# Patient Record
Sex: Female | Born: 1955 | Race: White | Hispanic: No | Marital: Married | Smoking: Former smoker
Health system: Southern US, Community
[De-identification: ages and names within clinical notes are randomized; demographics above are authoritative.]

## PROBLEM LIST (undated history)

## (undated) DIAGNOSIS — J984 Other disorders of lung: Secondary | ICD-10-CM

## (undated) DIAGNOSIS — J45909 Unspecified asthma, uncomplicated: Secondary | ICD-10-CM

## (undated) DIAGNOSIS — I10 Essential (primary) hypertension: Principal | ICD-10-CM

## (undated) HISTORY — DX: Essential (primary) hypertension: I10

## (undated) HISTORY — PX: TONSILLECTOMY: SHX5217

## (undated) HISTORY — DX: Other disorders of lung: J98.4

## (undated) HISTORY — DX: Unspecified asthma, uncomplicated: J45.909

## (undated) HISTORY — PX: ABDOMINAL HYSTERECTOMY: SHX81

## (undated) HISTORY — PX: BUNIONECTOMY: SHX129

---

## 1999-06-18 ENCOUNTER — Encounter: Admission: RE | Admit: 1999-06-18 | Discharge: 1999-06-18 | Payer: Self-pay | Admitting: Obstetrics and Gynecology

## 1999-06-18 ENCOUNTER — Encounter: Payer: Self-pay | Admitting: Obstetrics and Gynecology

## 1999-07-02 ENCOUNTER — Encounter: Admission: RE | Admit: 1999-07-02 | Discharge: 1999-07-02 | Payer: Self-pay | Admitting: Obstetrics and Gynecology

## 1999-07-02 ENCOUNTER — Encounter: Payer: Self-pay | Admitting: Obstetrics and Gynecology

## 1999-09-08 ENCOUNTER — Encounter (INDEPENDENT_AMBULATORY_CARE_PROVIDER_SITE_OTHER): Payer: Self-pay

## 1999-09-08 ENCOUNTER — Other Ambulatory Visit: Admission: RE | Admit: 1999-09-08 | Discharge: 1999-09-08 | Payer: Self-pay | Admitting: Obstetrics and Gynecology

## 2000-10-11 ENCOUNTER — Encounter: Payer: Self-pay | Admitting: Obstetrics and Gynecology

## 2000-10-11 ENCOUNTER — Encounter: Admission: RE | Admit: 2000-10-11 | Discharge: 2000-10-11 | Payer: Self-pay | Admitting: Obstetrics and Gynecology

## 2001-04-10 ENCOUNTER — Encounter: Payer: Self-pay | Admitting: Obstetrics and Gynecology

## 2001-04-18 ENCOUNTER — Inpatient Hospital Stay (HOSPITAL_COMMUNITY): Admission: RE | Admit: 2001-04-18 | Discharge: 2001-04-21 | Payer: Self-pay | Admitting: Obstetrics and Gynecology

## 2001-04-18 ENCOUNTER — Encounter (INDEPENDENT_AMBULATORY_CARE_PROVIDER_SITE_OTHER): Payer: Self-pay | Admitting: Specialist

## 2002-03-01 ENCOUNTER — Encounter: Admission: RE | Admit: 2002-03-01 | Discharge: 2002-03-01 | Payer: Self-pay | Admitting: Obstetrics and Gynecology

## 2002-03-01 ENCOUNTER — Encounter: Payer: Self-pay | Admitting: Obstetrics and Gynecology

## 2002-03-06 ENCOUNTER — Encounter: Payer: Self-pay | Admitting: Obstetrics and Gynecology

## 2002-03-06 ENCOUNTER — Encounter: Admission: RE | Admit: 2002-03-06 | Discharge: 2002-03-06 | Payer: Self-pay | Admitting: Obstetrics and Gynecology

## 2004-09-21 ENCOUNTER — Ambulatory Visit: Payer: Self-pay | Admitting: Internal Medicine

## 2004-12-10 ENCOUNTER — Ambulatory Visit: Payer: Self-pay | Admitting: Internal Medicine

## 2005-01-25 ENCOUNTER — Encounter: Admission: RE | Admit: 2005-01-25 | Discharge: 2005-01-25 | Payer: Self-pay | Admitting: Family Medicine

## 2005-05-24 ENCOUNTER — Other Ambulatory Visit: Admission: RE | Admit: 2005-05-24 | Discharge: 2005-05-24 | Payer: Self-pay | Admitting: Family Medicine

## 2005-08-08 ENCOUNTER — Ambulatory Visit (HOSPITAL_COMMUNITY): Admission: RE | Admit: 2005-08-08 | Discharge: 2005-08-08 | Payer: Self-pay | Admitting: Gastroenterology

## 2005-08-08 ENCOUNTER — Encounter (INDEPENDENT_AMBULATORY_CARE_PROVIDER_SITE_OTHER): Payer: Self-pay | Admitting: *Deleted

## 2008-01-22 ENCOUNTER — Ambulatory Visit: Payer: Self-pay | Admitting: Surgery

## 2008-05-06 ENCOUNTER — Ambulatory Visit: Payer: Self-pay | Admitting: Surgery

## 2008-05-06 ENCOUNTER — Encounter: Admission: RE | Admit: 2008-05-06 | Discharge: 2008-05-06 | Payer: Self-pay | Admitting: Surgery

## 2008-11-18 ENCOUNTER — Encounter: Admission: RE | Admit: 2008-11-18 | Discharge: 2008-11-18 | Payer: Self-pay | Admitting: Surgery

## 2008-11-18 ENCOUNTER — Ambulatory Visit: Payer: Self-pay | Admitting: Surgery

## 2009-08-18 ENCOUNTER — Ambulatory Visit: Payer: Self-pay | Admitting: Surgery

## 2009-08-18 ENCOUNTER — Encounter: Payer: Self-pay | Admitting: Internal Medicine

## 2009-08-18 ENCOUNTER — Encounter: Admission: RE | Admit: 2009-08-18 | Discharge: 2009-08-18 | Payer: Self-pay | Admitting: Surgery

## 2009-09-01 ENCOUNTER — Ambulatory Visit: Payer: Self-pay | Admitting: Internal Medicine

## 2009-09-01 DIAGNOSIS — J441 Chronic obstructive pulmonary disease with (acute) exacerbation: Secondary | ICD-10-CM | POA: Insufficient documentation

## 2009-09-01 DIAGNOSIS — J45909 Unspecified asthma, uncomplicated: Secondary | ICD-10-CM

## 2009-09-01 DIAGNOSIS — J4489 Other specified chronic obstructive pulmonary disease: Secondary | ICD-10-CM | POA: Insufficient documentation

## 2009-09-01 DIAGNOSIS — J449 Chronic obstructive pulmonary disease, unspecified: Secondary | ICD-10-CM

## 2009-09-01 HISTORY — DX: Unspecified asthma, uncomplicated: J45.909

## 2009-09-08 DIAGNOSIS — R918 Other nonspecific abnormal finding of lung field: Secondary | ICD-10-CM

## 2009-09-08 DIAGNOSIS — I10 Essential (primary) hypertension: Secondary | ICD-10-CM | POA: Insufficient documentation

## 2009-09-08 DIAGNOSIS — J984 Other disorders of lung: Secondary | ICD-10-CM

## 2009-09-08 HISTORY — DX: Other disorders of lung: J98.4

## 2009-09-08 HISTORY — DX: Essential (primary) hypertension: I10

## 2009-11-02 ENCOUNTER — Telehealth (INDEPENDENT_AMBULATORY_CARE_PROVIDER_SITE_OTHER): Payer: Self-pay | Admitting: *Deleted

## 2009-11-12 ENCOUNTER — Ambulatory Visit: Payer: Self-pay | Admitting: Internal Medicine

## 2009-11-27 ENCOUNTER — Telehealth: Payer: Self-pay | Admitting: Internal Medicine

## 2010-07-20 NOTE — Assessment & Plan Note (Signed)
Summary: ROV AFTER PFT ///KP   Primary Provider/Referring Provider:  Dr Ace Gins  CC:  Follow up visit after PFT.  History of Present Illness: History of Present Illness: 09-23-09- Asthma Former smoker with chronic, adult onset asthma, last seen by me several years ago. Followed by Dr Laneta Simmers with 3 stable lung nodules. Asthma worse through the winter after a cold. Notes more exertional dyspnea with her yoga.  She restarted Advair 250/50 and Proair. Asmanex didn't work. Uses Proair several times daily.  Just finished most recent round of prednisone. Notes that she started Lisinopril. Noting dry cough. She is trying to get back on Benicar. Not much post nasal drip. Not aware of heart burn or reflux. No cardiac disease.  Nov 12, 2009- Asthma, HTN, Lung nodule/ Dr Laneta Simmers PFT reviewed. Fev1/FVC 0.69 Her PF best is 380 and on last BP med she was at 160. For 2 days has been off BP med. Discused effect of BP med changes- difficult getting comfortable control. Now on Benicar, trying different samples. Using rescue inhaler up t twice daily. Using Advair regularly. Dr Laneta Simmers follows for lung nodule.      Asthma History    Asthma Control Assessment:    Age range: 12+ years    Symptoms: 0-2 days/week    Nighttime Awakenings: 0-2/month    Interferes w/ normal activity: no limitations    SABA use (not for EIB): 0-2 days/week    FEV1: 2.27 liters (today)    FEV1 Pred: 2.41 liters (today)    PEF: 6.54 liters/minute (today)    Asthma Control Assessment: Very Poorly Controlled   Preventive Screening-Counseling & Management  Alcohol-Tobacco     Smoking Status: quit > 6 months  Current Medications (verified): 1)  Advair Diskus 100-50 Mcg/dose Aepb (Fluticasone-Salmeterol) .Marland Kitchen.. 1 Puff Two Times A Day and Rinse Mouth Wel 2)  Proair Hfa 108 (90 Base) Mcg/act Aers (Albuterol Sulfate) .... 2 Puffs Four Times A Day As Needed 3)  Benicar Hct 20-12.5 Mg Tabs (Olmesartan  Medoxomil-Hctz) .... Take 1 By Mouth Once Daily 4)  Wellbutrin Xl 150 Mg Xr24h-Tab (Bupropion Hcl) .... Take 1 By Mouth Once Daily 5)  Alprazolam 0.5 Mg Tabs (Alprazolam) .... Take 1 By Mouth Three Times A Day As Needed  Allergies (verified): 1)  ! Tetracycline 2)  ! 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  The patient denies shortness of breath at rest, productive cough, non-productive cough, coughing up blood, chest pain, irregular heartbeats, acid heartburn, indigestion, loss of appetite, weight change, abdominal pain, difficulty swallowing, sore throat, tooth/dental problems, headaches, nasal congestion/difficulty breathing through nose, and sneezing.    Vital Signs:  Patient profile:   55 year old female Height:      64 inches Weight:      161 pounds BMI:     27.74 O2 Sat:  98 % on Room air Pulse rate:   71 / minute BP sitting:   124 / 80  (left arm) Cuff size:   regular  Vitals Entered By: Reynaldo Minium CMA (Nov 12, 2009 11:18 AM)  O2 Flow:  Room air  Physical Exam  Additional Exam:  General: A/Ox3; pleasant and cooperative, NAD, SKIN: no rash, lesions NODES: no lymphadenopathy HEENT: [place]/AT, EOM- WNL, Conjuctivae- clear, PERRLA, TM-WNL, Nose- clear, Throat- clear and wnl, .Mallampati  II NECK:  Supple w/ fair ROM, JVD- none, normal carotid impulses w/o bruits Thyroid- normal to palpation CHEST: Clear to P&A, very distant with no wheeze HEART: RRR, no m/g/r heard ABDOMEN: Soft and nl;  ZOX:WRUE, nl pulses, no edema  NEURO: Grossly intact to observation      Pre-Spirometry FEV1    Value: 2.27 L     Pred: 2.41 L     Impression & Recommendations:  Problem # 1:  ASTHMA (ICD-493.90) Asthma with a mild fixed small airway component perhaps residual from remote smoking. She is generally doing well. We discussed control status, lisinopril cough and influence of weather and BP meds.  Problem # 2:  HYPERTENSION (ICD-401.9)  Asking her to try alternatives to lisinopril because of cough, has left her wandering through sample trials with Dr Larina Bras. We discussed my preference that she avoid ACEIs and B-blockers if possible. Her updated medication list for this problem includes:    Benicar Hct 20-12.5 Mg Tabs (Olmesartan medoxomil-hctz) .Marland Kitchen... Take 1 by mouth once daily  Medications Added to Medication List This Visit: 1)  Benicar Hct 20-12.5 Mg Tabs (Olmesartan medoxomil-hctz) .... Take 1 by mouth once daily  Other Orders: Est. Patient Level IV (45409)  Patient Instructions: 1)  Please schedule a follow-up appointment in 1 year. 2)  Continue present meds- call as needed. 3)  Samples Benicar/HCT 20/ 12.5 x 3 weeks

## 2010-07-20 NOTE — Assessment & Plan Note (Signed)
Summary: breathing problem/ mbw   Primary Provider/Referring Provider:  Dr Kelsey Duran  CC:  follow up visit at pt's request; hasnt been seen in a while.Marland Kitchen  History of Present Illness: 20-Sep-2009- Asthma Former smoker with chronic, adult onset asthma, last seen by me several years ago. Followed by Dr Kelsey Duran with 3 stable lung nodules. Asthma worse through the winter after a cold. Notes more exertional dyspnea with her yoga.  She restarted Advair 250/50 and Proair. Asmanex didn't work. Uses Proair several times daily.  Just finished most recent round of prednisone. Notes that she started Lisinopril. Noting dry cough. She is trying to get back on Benicar. Not much post nasal drip. Not aware of heart burn or reflux. No cardiac disease.   Preventive Screening-Counseling & Management  Alcohol-Tobacco     Smoking Status: quit  Current Medications (verified): 1)  Advair Diskus 100-50 Mcg/dose Aepb (Fluticasone-Salmeterol) .Marland Kitchen.. 1 Puff Two Times A Day and Rinse Mouth Wel 2)  Proair Hfa 108 (90 Base) Mcg/act Aers (Albuterol Sulfate) .... 2 Puffs Four Times A Day As Needed 3)  Lisinopril-Hydrochlorothiazide 10-12.5 Mg Tabs (Lisinopril-Hydrochlorothiazide) .... Take 1 By Mouth Once Daily 4)  Wellbutrin Xl 150 Mg Xr24h-Tab (Bupropion Hcl) .... Take 1 By Mouth Once Daily 5)  Alprazolam 0.5 Mg Tabs (Alprazolam) .... Take 1 By Mouth Three Times A Day As Needed  Allergies (verified): 1)  ! Tetracycline 2)  ! 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 56 Divorced, 1 child Smoking Status:  quit  Review of Systems      See HPI       The patient complains of dyspnea on exertion and prolonged cough.  The patient denies anorexia, fever, weight loss, weight gain, vision loss, decreased hearing, hoarseness, chest pain, syncope, peripheral edema, headaches, hemoptysis, abdominal pain, and severe indigestion/heartburn.    Vital Signs:  Patient profile:   55 year old female Height:      64 inches Weight:      167.13 pounds BMI:     28.79 O2 Sat:      100 % on Room air Pulse rate:   78 / minute BP sitting:   118 / 76  (left arm) Cuff size:   regular  Vitals Entered By: Kelsey Duran CMA (09-20-2009 2:12 PM)  O2 Flow:  Room air  Physical Exam  Additional Exam:  General: A/Ox3; pleasant and cooperative, NAD, SKIN: no rash, lesions NODES: no lymphadenopathy HEENT: [place]/AT, EOM- WNL, Conjuctivae- clear, PERRLA, TM-WNL, Nose- clear, Throat- clear and wnl, .Mallampati  II NECK: Supple w/ fair ROM, JVD- none, normal carotid impulses w/o bruits Thyroid- normal to palpation CHEST: Clear to P&A HEART: RRR, no m/g/r heard ABDOMEN: Soft and nl;  EAV:WUJW, nl pulses, no edema  NEURO: Grossly intact to observation      Impression & Recommendations:  Problem # 1:  ASTHMA (ICD-493.90) Chronic intermittent asthma, worse since bronchitis this Fall. We will get old chart for PFT and  get CT report as per Dr Kelsey Duran. I will have her use the Advair 250 twice daily, substitute for lisinopril for comparison, and add Spiriva trial.  Problem # 2:  LUNG NODULE (ICD-518.89) This has been followed by Dr Kelsey Duran. CT report from 08/18/09 indicates bibasilar nodules stable since 2009.  Medications Added to Medication List  This Visit: 1)  Advair Diskus 100-50 Mcg/dose Aepb (Fluticasone-salmeterol) .Marland Kitchen.. 1 puff two times a day and rinse mouth wel 2)  Proair Hfa 108 (90 Base) Mcg/act Aers (Albuterol sulfate) .... 2 puffs four times a day as needed 3)  Lisinopril-hydrochlorothiazide 10-12.5 Mg Tabs (Lisinopril-hydrochlorothiazide) .... Take 1 by mouth once daily 4)  Wellbutrin Xl 150 Mg Xr24h-tab (Bupropion hcl) .... Take 1 by mouth once daily 5)  Alprazolam 0.5 Mg Tabs (Alprazolam) .... Take 1 by mouth three times a day as needed  Other Orders: Est. Patient Level IV (56213)  Patient Instructions: 1)  Please schedule a follow-up appointment in 1 month. 2)  Schedule PFT 3)  Samples BenicarHCT to try instead of Lisinopril 4)  Use Advair 250/50 (verify your strength), 1 puff and rinse mouth twice daily 5)  Try sample Spiriva, 1 daily.

## 2010-07-20 NOTE — Miscellaneous (Signed)
Summary: Orders Update pft charges  Clinical Lists Changes  Orders: Added new Service order of Carbon Monoxide diffusing w/capacity (94720) - Signed Added new Service order of Lung Volumes (94240) - Signed Added new Service order of Spirometry (Pre & Post) (94060) - Signed 

## 2010-07-20 NOTE — Progress Notes (Signed)
Summary: PRESCRIPT/ SAMPLES  Phone Note Call from Patient   Caller: Patient Call For: Kelsey Duran Summary of Call: PT NEED PRESCRIPT SENT TO [facility] FOR 90 DAY SUPPLY FOR ADVAIR250/50 AND WOULD LIKE SAMPLES IF WE HAVE. Initial call taken by: Rickard [name],  November 27, 2009 10:23 AM  Follow-up for Phone Call        Spoke with pt to verify strength of Advair because at OV on 08-2009 CY wrote to cont advair 250-5- but to verify dose because advair 100-50 is on opt med list. Pt states she could not remember at the OV what strength but it is advair 250-50 that she has been taking. Refill sent and a sample left at front to last pt until shipment comes. pt aware. med list corrected.  Carron Curie CMA  November 27, 2009 10:40 AM     New/Updated Medications: ADVAIR DISKUS 250-50 MCG/DOSE AEPB (FLUTICASONE-SALMETEROL) one puff twice daily Prescriptions: ADVAIR DISKUS 250-50 MCG/DOSE AEPB (FLUTICASONE-SALMETEROL) one puff twice daily  #3 x 3   Entered by:   Carron Curie CMA   Authorized by:   Waymon Budge MD   Signed by:   Carron Curie CMA on 11/27/2009   Method used:   Electronically to        [facility] (mail-order)             ,          Ph: [phone]       Fax: [phone]   RxID:   [id number]   Appended Document: PRESCRIPT/ SAMPLES    Clinical Lists Changes  Medications: Rx of ADVAIR DISKUS 250-50 MCG/DOSE AEPB (FLUTICASONE-SALMETEROL) one puff twice daily;  #3 x 3;  Signed;  Entered by: Carron Curie CMA;  Authorized by: Waymon Budge MD;  Method used: Faxed to [facility] [place] , ,   , Ph: [phone], Fax: [phone]    Prescriptions: ADVAIR DISKUS 250-50 MCG/DOSE AEPB (FLUTICASONE-SALMETEROL) one puff twice daily  #3 x 3   Entered by:   Carron Curie CMA   Authorized by:   Waymon Budge MD   Signed by:   Carron Curie CMA on 11/27/2009   Method used:   Faxed to ...       [facility] (mail-order)             ,          Ph:  [phone]       Fax: [phone]   RxID:   [id number]  rx sent electronically and needed to be sent by fax. Carron Curie CMA  November 27, 2009 10:42 AM

## 2010-07-20 NOTE — Progress Notes (Signed)
Summary: samples  Phone Note Call from Patient Call back at Home Phone [phone] Call back at [phone]   Caller: Patient Call For: young Reason for Call: Talk to Nurse Summary of Call: Benicar HCT - Can she get a couple of more samples to last her until she sees hime again?  20mg /12.5 Initial call taken by: Eugene Gavia,  Nov 02, 2009 9:03 AM  Follow-up for Phone Call        Pt aware that samples were left at front desk for pick up. Abigail Miyamoto RN  Nov 02, 2009 9:14 AM

## 2010-07-29 ENCOUNTER — Telehealth: Payer: Self-pay | Admitting: Internal Medicine

## 2010-08-05 NOTE — Progress Notes (Signed)
Summary: refill  Phone Note Call from Patient Call back at Work Phone [phone]   Caller: Patient Call For: Juanisha Bautch Reason for Call: Refill Medication Summary of Call: Requests refills on proair hfa 108 (3 month supply).//[facility] Initial call taken by: Darletta Moll,  July 29, 2010 3:21 PM  Follow-up for Phone Call        Rx sent. Pt aware.Carron Curie CMA  July 29, 2010 4:04 PM     Prescriptions: PROAIR HFA 108 (90 BASE) MCG/ACT AERS (ALBUTEROL SULFATE) 2 puffs four times a day as needed  #3 x 3   Entered by:   Carron Curie CMA   Authorized by:   Waymon Budge MD   Signed by:   Carron Curie CMA on 07/29/2010   Method used:   Faxed to ...       [facility] (mail-order)             , [place]         Ph: [phone]       Fax: [phone]   RxID:   [id number]

## 2010-08-11 ENCOUNTER — Other Ambulatory Visit: Payer: Self-pay | Admitting: Surgery

## 2010-08-11 DIAGNOSIS — R918 Other nonspecific abnormal finding of lung field: Secondary | ICD-10-CM

## 2010-09-07 ENCOUNTER — Ambulatory Visit
Admission: RE | Admit: 2010-09-07 | Discharge: 2010-09-07 | Disposition: A | Discharge status: Home / Self Care | Payer: 59 | Source: Ambulatory Visit | Attending: Surgery | Admitting: Surgery

## 2010-09-07 ENCOUNTER — Encounter (INDEPENDENT_AMBULATORY_CARE_PROVIDER_SITE_OTHER): Payer: 59 | Admitting: Surgery

## 2010-09-07 DIAGNOSIS — D381 Neoplasm of uncertain behavior of trachea, bronchus and lung: Secondary | ICD-10-CM

## 2010-09-07 DIAGNOSIS — R918 Other nonspecific abnormal finding of lung field: Secondary | ICD-10-CM

## 2010-09-07 NOTE — Assessment & Plan Note (Signed)
OFFICE VISIT  Kelsey, Duran DOB:  06/15/1956                                        September 07, 2010 CHART #:  [id number]  The patient returned to my office today for followup of bilateral noncalcified pulmonary nodules.  I last saw her on August 18, 2009, at which time, chest CT scan showed the bilateral pulmonary nodules to be stable with the largest nodule in the right lower lobe measuring about 7 mm.  Over the past year, she said she has felt well.  She denies any fever or chills.  She has had no cough or sputum production and denies hemoptysis.  She has had no chest discomfort.  Her weight has been stable.  On physical examination today, her blood pressure is 103/73, pulse is 104 and regular, respiratory rate 16 and unlabored.  Oxygen saturation on room air is 97%.  She looks well.  There is no cervical or supraclavicular adenopathy.  Her lungs are clear.  A chest CT scan today shows no change in the size of the small bilateral pulmonary nodules.  The largest one is still in the right lower lobe and measuring about 7 mm.  There are no new nodules present and no lymphadenopathy.  There is no pleural effusion.  IMPRESSION:  The patient has stable small bilateral pulmonary nodules that have not changed since November of 2009.  These are most likely benign.  I have recommended followup in 1 year with a chest x-ray.  All these nodules are much too small to see on chest x-ray, but given her stability since 2009, I do not think there is much benefit in continuing to do yearly CT scans.  There is also significant risk from the radiation dose of a pediatric CT scans.  Chest x-ray should allow [place] to pick up any significant changes in these pulmonary nodules. She is in agreement with that plan.  I will see her back in 1 year.  Kelsey Duran, M.D. Electronically Signed  BB/MEDQ  D:  09/07/2010  T:  09/07/2010  Job:  [id number]  cc:   Duncan Dull, M.D.

## 2010-10-14 ENCOUNTER — Ambulatory Visit: Payer: 59 | Admitting: Internal Medicine

## 2010-10-19 ENCOUNTER — Ambulatory Visit (INDEPENDENT_AMBULATORY_CARE_PROVIDER_SITE_OTHER): Payer: 59 | Admitting: Internal Medicine

## 2010-10-19 ENCOUNTER — Encounter: Payer: Self-pay | Admitting: Internal Medicine

## 2010-10-19 DIAGNOSIS — J45909 Unspecified asthma, uncomplicated: Secondary | ICD-10-CM

## 2010-10-19 DIAGNOSIS — Z0000 Encounter for general adult medical examination without abnormal findings: Secondary | ICD-10-CM

## 2010-10-19 DIAGNOSIS — I10 Essential (primary) hypertension: Secondary | ICD-10-CM

## 2010-10-19 MED ORDER — PREDNISONE 10 MG PO KIT: 1.0000 | PACK | ORAL | Status: DC

## 2010-10-19 MED ORDER — CLOBETASOL PROPIONATE 0.05 % EX CREA: TOPICAL_CREAM | Freq: Two times a day (BID) | CUTANEOUS | Status: DC

## 2010-10-19 MED ORDER — ALPRAZOLAM 0.5 MG PO TABS: 0.5000 mg | ORAL_TABLET | Freq: Two times a day (BID) | ORAL | Status: AC | PRN

## 2010-10-19 MED ORDER — ALBUTEROL SULFATE HFA 108 (90 BASE) MCG/ACT IN AERS: 2.0000 | INHALATION_SPRAY | Freq: Four times a day (QID) | RESPIRATORY_TRACT | Status: DC | PRN

## 2010-10-19 MED ORDER — FLUTICASONE-SALMETEROL 250-50 MCG/DOSE IN AEPB: 1.0000 | INHALATION_SPRAY | Freq: Two times a day (BID) | RESPIRATORY_TRACT | Status: DC

## 2010-10-19 MED ORDER — BUPROPION HCL ER (SR) 150 MG PO TB12: 150.0000 mg | ORAL_TABLET | Freq: Every day | ORAL | Status: DC

## 2010-10-19 NOTE — Progress Notes (Signed)
Subjective:    Patient ID: Kelsey Duran, female    DOB: 1956-04-19, 55 y.o.   MRN: [id number]  HPI 55 year old patient who is seen today to status with our practice. She has a long history of asthma and has been followed by pulmonary medicine. She has a ten-year history of hypertension. She has done relatively well except for a flare of hand eczema. She has tried a number of topical steroid creams including super high potency without much benefit. Her asthma has been fairly stable she does have a history of cough associated with lisinopril treatment in the past. She states that  she had 3 cortisone injections last year for treatment of what sounds like plantar fasciitis. No recent oral or injectable cortisone use  Colonoscopy at age 9   Past Medical History  Diagnosis Date  . ASTHMA 09/01/2009  . HYPERTENSION 09/08/2009  . LUNG NODULE 09/08/2009   Past Surgical History  Procedure Date  . Abdominal hysterectomy   . Tonsillectomy   . Bunionectomy     reports that she quit smoking about 27 years ago. She has never used smokeless tobacco. She reports that she drinks alcohol. She reports that she uses illicit drugs. family history is not on file. Allergies  Allergen Reactions  . Penicillins   . Tetracycline      Review of Systems  Constitutional: Negative for fever, appetite change, fatigue and unexpected weight change.  HENT: Negative for hearing loss, ear pain, nosebleeds, congestion, sore throat, mouth sores, trouble swallowing, neck stiffness, dental problem, voice change, sinus pressure and tinnitus.   Eyes: Negative for photophobia, pain, redness and visual disturbance.  Respiratory: Negative for cough, chest tightness and shortness of breath.   Cardiovascular: Negative for chest pain, palpitations and leg swelling.  Gastrointestinal: Negative for nausea, vomiting, abdominal pain, diarrhea, constipation, blood in stool, abdominal distention and rectal pain.  Genitourinary:  Negative for dysuria, urgency, frequency, hematuria, flank pain, vaginal bleeding, vaginal discharge, difficulty urinating, genital sores, vaginal pain, menstrual problem and pelvic pain.  Musculoskeletal: Negative for back pain and arthralgias.  Skin: Positive for rash.  Neurological: Negative for dizziness, syncope, speech difficulty, weakness, light-headedness, numbness and headaches.  Hematological: Negative for adenopathy. Does not bruise/bleed easily.  Psychiatric/Behavioral: Negative for suicidal ideas, behavioral problems, self-injury, dysphoric mood and agitation. The patient is not nervous/anxious.        Objective:   Physical Exam  Constitutional: She is oriented to person, place, and time. She appears well-developed and well-nourished.  HENT:  Head: Normocephalic and atraumatic.  Right Ear: External ear normal.  Left Ear: External ear normal.  Mouth/Throat: Oropharynx is clear and moist.  Eyes: Conjunctivae and EOM are normal. Pupils are equal, round, and reactive to light.  Neck: Normal range of motion. Neck supple. No JVD present. No thyromegaly present.  Cardiovascular: Normal rate, regular rhythm, normal heart sounds and intact distal pulses.   No murmur heard. Pulmonary/Chest: Effort normal and breath sounds normal. She has no wheezes. She has no rales.  Abdominal: Soft. Bowel sounds are normal. She exhibits no distension and no mass. There is no tenderness. There is no rebound and no guarding.  Musculoskeletal: Normal range of motion. She exhibits no edema and no tenderness.  Lymphadenopathy:    She has no cervical adenopathy.  Neurological: She is alert and oriented to person, place, and time. She has normal reflexes. No cranial nerve deficit. She exhibits normal muscle tone. Coordination normal.  Skin: Skin is warm and dry. Rash noted.  Erythematous dry flaky dermatitis involving both hands especially the palmar surface  Psychiatric: She has a normal mood and  affect. Her behavior is normal.          Assessment & Plan:  Asthma. Recently well-controlled Hypertension. Nice control Hand eczema. Will treat with a double strength 12 day prednisone Dosepak  Recheck in 6 months

## 2010-10-19 NOTE — Patient Instructions (Signed)
Limit your sodium (Salt) intake  Please check your blood pressure on a regular basis.  If it is consistently greater than 150/90, please make an office appointment.  Return in 6 months for follow-up   

## 2010-10-21 ENCOUNTER — Telehealth: Payer: Self-pay | Admitting: *Deleted

## 2010-10-21 MED ORDER — PREDNISONE (PAK) 10 MG PO TABS: 10.0000 mg | ORAL_TABLET | Freq: Every day | ORAL | Status: AC

## 2010-10-21 NOTE — Telephone Encounter (Signed)
Spoke with Dr. Amador Cunas , ordered incorrectly - new rx to [facility]. Attempt to call pt - VM - LMTCB if questions , [facility] rx [facility] KIK

## 2010-10-21 NOTE — Telephone Encounter (Addendum)
Pt was seen on Tuesday and under the impression she was getting Prednisone.  Has checked with [facility] on [address], and there are no meds there for her.  Please call and let her know the status.

## 2010-11-02 NOTE — Assessment & Plan Note (Signed)
OFFICE VISIT   Kelsey Duran, Kelsey Duran  DOB:  01-09-1956                                        November 18, 2008  CHART #:  [id number]   HISTORY OF PRESENT ILLNESS:  The patient returns today for followup of  noncalcified right lower lobe lung nodules seen on previous CT scan.  I  last saw her in May 06, 2008.  At which time, CT scan showed  several small noncalcified pulmonary nodules in the right lower lobe,  the largest measuring about 7 mm.  This was unchanged from her previous  CT scan.  The other nodules in the right lower lobe ranging size from 4-  6 mm and had not been noted on previous CT scan.  Since I last saw her,  she has had no complaints.   PHYSICAL EXAMINATION:  VITAL SIGNS:  Today, blood pressure is 122/76,  pulse 64 and regular, respiratory rate is 16, unlabored.  Oxygen  saturation 99% on room air.  GENERAL:  She looks well.  CARDIAC:  Regular rate and rhythm with normal heart sounds.  LUNGS:  Clear.  NECK:  There was no cervical or supraclavicular adenopathy.   DIAGNOSTIC DATA:  Followup CT scan of the chest today shows no  significant change in the small noncalcified pulmonary nodules in the  right lower lobe.  There are no pulmonary infiltrates or pleural  effusions.  There was no hilar or mediastinal adenopathy.   IMPRESSION:  The right lower lobe lung nodules are unchanged.  They are  small and noncalcified.  These may be benign scars but since they are  noncalcified they certainly could be small, developing cancers such as  bronchoalveolar cell carcinoma.  There also may be a precancerous  lesions.  They are too small to biopsied.  I recommended that we repeat  her CT scan in about 9 months to follow up on these lesions, and they  will require a longer term  followup to be sure that there is no significant change.  I will see her  back in 9 months with a repeat CT scan of the chest.   Evelene Croon, M.D.  Electronically Signed   BB/MEDQ  D:  11/18/2008  T:  11/19/2008  Job:  [id number]   cc:   Talmadge Coventry, M.D.

## 2010-11-02 NOTE — Assessment & Plan Note (Signed)
OFFICE VISIT   LEMMA, TETRO  DOB:  01-31-1956                                        May 06, 2008  CHART #:  [id number]   The patient returned today for followup of a right lower lobe lung  nodule seen on CT scan.  I saw her on January 22, 2008, after a CT scan on  January 11, 2008, showed a 67-mm nodule lesions in the superior segment of  the right lower lobe that is concerning for an early neoplasm.  It was  felt to be too small biopsy at that time and I thought that it may be an  inflammatory lesion.  I recommended doing a followup CT scan in 3  months.  Since I last saw her, she states she has been feeling well  overall with no specific complaints.   PHYSICAL EXAMINATION:  VITAL SIGNS:  Her blood pressure 115/76 and her  pulse is 80 and regular.  Respiratory rate is 18, unlabored.  Oxygen  saturation on room air is 98%.  GENERAL:  She looks well.  NECK:  There is no cervical, supraclavicular, or axillary adenopathy.  LUNGS:  Clear.   Followup chest CT scan dated May 06, 2008, shows several small  noncalcified pulmonary nodules in the right lower lobe.  The largest one  measures 7 mm and is located in the lateral and superior aspects of the  right lower lobe.  This is unchanged from her previous CT scan.  The  other nodules in the right lower lobe range in size from 4-6 mm and were  not noted on the previous CT scan of the chest.  There is no evidence of  pulmonary infiltrate or pleural effusion.  There is no evidence of hilar  or mediastinal masses or any lymphadenopathy.   IMPRESSION:  The previously noted right lower lobe lung nodule is  unchanged on CT scan.  There are a few other right lower lobe lung  nodules that are smaller, that were not noted on previous CT scan,  although this is a much better quality scan done today.  I reviewed her  old scan and there is one other nodule that was seen at about the same  level as the 7-mm  nodule, but more posteriorly.  It was not common at  all on a previous scan, but looks about the same today as on that  previous scan.  I think overall her CT scan is probably unchanged and  these nodules are  very small and indeterminate.  They are too small biopsy and I would  recommend continued follow up with a repeat CT scan in 6 months.  I will  see her back after that.   Evelene Croon, M.D.  Electronically Signed   BB/MEDQ  D:  05/06/2008  T:  05/07/2008  Job:  [id number]   cc:   Talmadge Coventry, M.D.

## 2010-11-02 NOTE — Assessment & Plan Note (Signed)
OFFICE VISIT   Kelsey Duran, Kelsey Duran  DOB:  07-Dec-1955                                        August 18, 2009  CHART #:  [id number]   HISTORY:  The patient returned to my office today for followup of  bilateral noncalcified lung nodules.  I last saw her on November 18, 2008, at  which time CT scan showed no significant change and small noncalcified  pulmonary nodules in both lungs with the largest being in the right  lower lobe.  Over the past year, she has had no change in her overall  condition.  She said she did develop an upper respiratory infection in  November, has had some cough ever since.  She denies any sputum  production.  She has had no hemoptysis.  She denies any fever or chills.   PHYSICAL EXAMINATION:  Today, blood pressure is 116/68, pulse is 82 and  regular, respiratory rate 16 and unlabored.  Oxygen saturation on room  air is 98%.  She looks well.  Neck exam shows no cervical or  supraclavicular adenopathy.  Lungs are clear.  Cardiac exam shows a  regular rate and rhythm with normal heart sounds.   DIAGNOSTIC TESTS:  Followup CT scan of the chest today shows no change  in the scattered bilateral pulmonary nodules compared to approximately 1  year ago.  The largest nodule was still in the right lower lobe and  measures about 7 mm.  There are no other abnormalities in the lung and  no pleural effusion.   IMPRESSION:  The patient has small bilateral pulmonary nodules they are  noncalcified that are unchanged since November 2009.  I think these are  most likely benign, but we will plan to see her back in 1 year with a  repeat CT scan of the chest to follow up on these.  I think that these  nodules are unchanged at that time, then we may not need to follow them  chronically.   Evelene Croon, M.D.  Electronically Signed   BB/MEDQ  D:  08/18/2009  T:  08/19/2009  Job:  [id number]
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
## 2010-11-04 ENCOUNTER — Encounter: Payer: Self-pay | Admitting: Internal Medicine

## 2010-11-05 NOTE — Op Note (Signed)
[facility]  Patient:    Kelsey, SOL Visit Number: [id number] MRN: [id number]          Service Type: GYN Location: 4W 0449 01 Attending Physician:  Lendon Colonel Dictated by:   Kathie Rhodes. Kyra Manges, M.D. Proc. Date: 04/18/01 Admit Date:  04/18/2001                             Operative Report  PREOPERATIVE DIAGNOSIS: Genital prolapse with cystocele, rectocele, and cervical prolapse.  POSTOPERATIVE DIAGNOSIS:  Genital prolapse with cystocele, rectocele, and cervical prolapse.  OPERATION PERFORMED:  Vaginal hysterectomy, A and P repair.  PROCEDURE: The patient was placed in the lithotomy position and prepped and draped in the usual fashion. The cervix was injected with a 1% Xylocaine with epinephrine solution.  The posterior cul-de-sac was incised with sharp dissection.  The uterosacral ligaments were identified, skeletonized, and clamped with a 0 chromic suture.  The cardinal ligaments were handled in a similar fashion. The anterior cul-de-sac was incised, and the vesicouterine peritoneum identified and entered.  The uterine arteries and upper broad ligament, including the round ligament were clamped and ligated with 0 chromic.  The specimen was then removed from the operative field.  The utero-ovarian anastomosis was then clamped and transected with 0 chromic. Both ovaries were normal.  A free tie was placed about the utero-ovarian pedicle as well.  The uterosacral ligaments were then carefully closed in the midline with interrupted sutures of 2-0 Ethibond and 0 Vicryl.  The peritoneum was then closed with 2-0 PDS.  Following this, the anterior repair was effected by dissecting the pubocervical vaginal fascia from the underlying vagina and plicating this in the midline with interrupted sutures of 3-0 Vicryl.  The vagina was then closed with interrupted sutures of 3-0 Vicryl and 4-0 Vicryl.  Posteriorly, the repair was effected by  dissecting the vagina from the underlying rectal fascia and plicating this in the midline with interrupted sutures of 3-0 and 4-0 Vicryl.  A wedge of the vagina was then removed and enclosed.  No pack was inserted.  Good repair appeared to be obtained.  The peritoneal skin was approximated with interrupted sutures of 3-0 and 4-0 Vicryl.  The bladder was then distended, and the suprapubic catheter was placed in.  This was hooked to drainage, as well as the Foley was.  There was no pack inserted.  About 10 cc of 1% Xylocaine with epinephrine was inserted into the posterior repair. Kelsey Duran tolerated this procedure well and was sent to the recovery room in good condition. Dictated by:   S. Kyra Manges, M.D. Attending Physician:  Lendon Colonel DD:  04/19/01 TD:  04/19/01 Job: [id number]

## 2010-11-05 NOTE — Op Note (Signed)
NAMEEARLIE, ARCIGA NO.:  [id number]   MEDICAL RECORD NO.:  [id number]          PATIENT TYPE:  AMB   LOCATION:  ENDO                         FACILITY:  [facility]   PHYSICIAN:  Anselmo Rod, M.D.  DATE OF BIRTH:  06/27/1955   DATE OF PROCEDURE:  08/08/2005  DATE OF DISCHARGE:                                 OPERATIVE REPORT   PROCEDURE PERFORMED:  Esophagogastroduodenoscopy with antral biopsies.   ENDOSCOPIST:  Charna Elizabeth, M.D.   INSTRUMENT USED:  Olympus video panendoscope.   INDICATIONS FOR PROCEDURE:  The patient is a 55 year old white female with a  history of melenic stools intermittently.  Rule out peptic ulcer disease,  esophagitis, gastritis, etc.   PREPROCEDURE PREPARATION:  Informed consent was procured from the patient.  The patient was fasted for four hours prior to the procedure.   PREPROCEDURE PHYSICAL:  The patient had stable vital signs.  Neck supple,  chest clear to auscultation.  S1, S2 regular.  Abdomen soft with normal  bowel sounds.   DESCRIPTION OF PROCEDURE:  The patient was placed in the left lateral  decubitus position and sedated with 50 mcg of fentanyl and 5 mg of Versed in  slow incremental doses.  Once the patient was adequately sedated and  maintained on low-flow oxygen and continuous cardiac monitoring, the Olympus  video panendoscope was advanced through the mouth piece over the tongue into  the esophagus under direct vision.  The proximal esophagus appeared normal.  There was mild distal esophagitis.  Antral gastritis was noted. Biopsies  were done to rule out presence of Helicobacter pylori by pathology.  No  ulcers, erosions, masses or polyps were identified.  The proximal small  bowel appeared normal.  There was no outlet obstruction.   IMPRESSION:  1.  Mild distal esophagitis.  2.  Antral gastritis, biopsies done for Helicobacter pylori.  3.  Normal-appearing proximal small bowel.   RECOMMENDATIONS:  1.  The  patient has been advised to try Nexium 40 mg by mouth daily or take      omeprazole 20 mg 2 pills every      morning 15 to 30 minutes before breakfast and avoid all nonsteroidals      for now.  2.  Outpatient followup in the next two weeks or earlier if need be.  3.  Proceed with a colonoscopy at this time.  Further recommendations will      be made in follow-up.      Anselmo Rod, M.D.  Electronically Signed     JNM/MEDQ  D:  08/09/2005  T:  08/10/2005  Job:  [id number]   cc:   Talmadge Coventry, M.D.  Fax: [phone]

## 2010-11-05 NOTE — H&P (Signed)
[facility]  Patient:    Kelsey Duran, Kelsey Duran Visit Number: [id number] MRN: [id number]          Service Type: GYN Location: 4W 0449 01 Attending Physician:  Lendon Colonel Dictated by:   Kathie Rhodes. Kyra Manges, M.D. Admit Date:  04/18/2001                           History and Physical  CHIEF COMPLAINT:  Pelvic pressure and feeling that things were falling out with mild stress incontinence.  HISTORY OF PRESENT ILLNESS:  The patient is a 55 year old gravida 1, para 1 female who delivered normally in 1993 who shows progressive pelvic pressure and feeling that things were falling out with a visible cervix at the introitus, a second and third cystocele, and moderate rectocele present. She desires permanent correction and is admitted for a ______ A&P repair. She has been fully informed of the risks and benefits, failure rate of the proposed surgery.  MEDICATIONS AND COMORBIDITIES: 1. Mild hypertension for which hydrochlorothiazide is given. 2. She is also on theophylline for asthma. She takes Serevent, Flovent, and    occasionally albuterol. Clinton D. Young, M.D. is her pulmonologist. In    September, she had to have a bolus of steroids for infection.  ALLERGIES:  She is allergic to PENICILLIN and has nausea with some CODEINE products, although she took Tylox in the past without difficulty.  PAST SURGICAL HISTORY:  She has a history of a tonsillectomy, oral surgery, and foot surgery.  REVIEW OF SYSTEMS:  HEENT:  She wears reading glasses but no decrease in visual or auditory acuity. No dizziness. No headaches. No frequent sore throat. HEART:  She has mild hypertension for which she takes a thiazide diuretic. It is well controlled and without symptoms of chest pain or exertional dyspnea. She has no history of mitral valve prolapse or rheumatic fever. LUNGS:  She has asthma and takes Serevent and Flovent on a regular basis. She noticed no shortness of  breath. No chronic cough or hemoptysis. GU: She has mild stress incontinence with no urge. No nocturia. No UTIs. GI:  No bowel habit change. No melena. No weight loss or gain. No anorexia. MUSCLES, BONES, AND JOINTS:  No fractures or arthritis.  SOCIAL HISTORY:  She worked for [employer]. She drinks socially. Does not smoke.  FAMILY HISTORY:  Her mother is 27, living and well. Her father died at age 52 of pancreatitis. She has one sister who is a borderline diabetic. She has a grandmother with heart disease and paternal uncle and aunt with diabetes.  PHYSICAL EXAMINATION:  GENERAL:  Well-developed, nourished female who appears to be younger than her stated age.  VITAL SIGNS:  Her blood pressure is 110/70, pulse 80, temperature 99.  HEENT:  Unremarkable. The oropharynx is not injected.  NECK:  Supple. Carotid pulses are equal bilaterally. The thyroid is not enlarged.  BREASTS:  No masses or tenderness. Axilla free from adenopathy.  LUNGS:  Clear to P&A.  HEART:  Normal sinus rhythm. No murmurs. No heaves, thrills, rubs, or gallops.  ABDOMEN:  Soft. Liver, spleen, and kidneys are not palpated. No bruits are heard. No tenderness.  EXTREMITIES:  Femoral pulses are equal. Good range of motion and equal pulses and reflexes.  PELVIC:  Second and third degree cystocele with the cervix at the introitus. Uterus is clinically normal size and shape. No adnexal masses are noted. There is moderate ______ rectal separation.  Hemoccult is negative. No pelvic masses are noted.  NEUROLOGICAL:  The patient is oriented to time, place, and recent events. Cranial nerves are grossly intact.  IMPRESSION:  Symptomatic pelvic relaxation with genital prolapse.  PLAN:  ______ A&P repair. Again, risks and benefits in detail and informed consent has been given to patient. Dictated by:   S. Kyra Manges, M.D. Attending Physician:  Lendon Colonel DD:  04/18/01 TD:  04/18/01 Job:  [id number]

## 2010-11-05 NOTE — Op Note (Signed)
Kelsey Duran, TROOP NO.:  [id number]   MEDICAL RECORD NO.:  [id number]          PATIENT TYPE:  AMB   LOCATION:  ENDO                         FACILITY:  [facility]   PHYSICIAN:  Anselmo Rod, M.D.  DATE OF BIRTH:  [birth year]   DATE OF PROCEDURE:  08/08/2005  DATE OF DISCHARGE:                                 OPERATIVE REPORT   PROCEDURE PERFORMED:  Screening colonoscopy.   ENDOSCOPIST:  Charna Elizabeth, M.D.   INSTRUMENT USED:  Olympus video colonoscope.   INDICATIONS FOR PROCEDURE:  A 55 year old white female undergoing screening  colonoscopy.  There is a questionable history of hematochezia.  The patient  has noted some black stools intermittently in the past.  Rule out colonic  polyps, masses, etc.   PREPROCEDURE PREPARATION:  Informed consent was procured from the patient.  The patient was fasted for four hours prior to the procedure after being  prepped with OsmoPrep pills the night of and the morning of the procedure.  The risks and benefits of the procedure including a 10% miss rate for polyps  or cancers was discussed with the patient as well.   PREPROCEDURE PHYSICAL:  The patient had stable vital signs.  Neck supple.  Chest clear to auscultation.  S1 and S2 regular.  Abdomen soft with normal  bowel sounds.   DESCRIPTION OF PROCEDURE:  The patient was placed in left lateral decubitus  position and sedated with an additional 100 mcg of fentanyl and 10 mg of  Versed in slow incremental doses.  Once the patient was adequately sedated  and maintained on low flow oxygen and continuous cardiac monitoring, the  Olympus video colonoscope was advanced from the rectum to the cecum.  A  small sessile polyp was removed by cold snare from the rectosigmoid colon.  The rest of the exam was unremarkable.  There was no evidence of  diverticulosis.  The appendicular orifice and ileocecal valve were clearly  visualized and photographed.  Midterminal ileum appeared healthy  and without  lesions.   IMPRESSION:  1.  Normal colonoscopy up to the terminal ileum except for a small sessile      polyp removed by cold snare from the rectosigmoid colon.  2.  Small external hemorrhoid seen on anal inspection.   RECOMMENDATIONS:  1.  Avoid all nonsteroidals for the next four weeks or longer.  2.  Repeat colonoscopy depending on pathology results once they are      procured.  3.  Outpatient followup as need arises in the future.      Anselmo Rod, M.D.  Electronically Signed     JNM/MEDQ  D:  08/09/2005  T:  08/10/2005  Job:  [id number]   cc:   Talmadge Coventry, M.D.  Fax: [phone]

## 2010-11-05 NOTE — Discharge Summary (Signed)
[facility]  Patient:    Kelsey Duran, Kelsey Duran Visit Number: [id number] MRN: [id number]          Service Type: GYN Location: 4W 0449 01 Attending Physician:  Lendon Colonel Dictated by:   Kathie Rhodes. Kyra Manges, M.D. Admit Date:  04/18/2001 Discharge Date: 04/21/2001   CC:         Clinton D. Maple Hudson, M.D.   Discharge Summary  ADMISSION DIAGNOSIS:  Genital prolapse with mild stress urinary incontinence.  DISCHARGE DIAGNOSIS:  Genital prolapse with mild stress urinary incontinence.  PROCEDURES: 1. Vaginal hysterectomy. 2. Anterior and posterior repair.  HISTORY OF PRESENT ILLNESS:  The patient was admitted to the hospital with pelvic pressure and the feelings that things were falling out with mild stress urinary incontinence.  On examination, she had third to fourth degree cystocele, and the cervix was at the introitus.  Pap smear was normal.  DIAGNOSTIC STUDIES:  Hemoglobin 14 and hematocrit 41.  SMA 23.  Routine chemistry was within normal limits.  Chest x-ray and electrocardiogram were normal.  COMORBIDITIES:  Asthma.  She was being followed by Clinton D. Young, M.D.  HOSPITAL COURSE:  The patient was admitted to the hospital and underwent an uneventful vaginal hysterectomy and A&P repair.  She was discharged on April 21, 2001 to home and office care.  She was given voiding trials at home.  She was given Percocet for pain and Ambien for sleep.  She is to use stool softeners.  CONDITION ON DISCHARGE:  Improved. Dictated by:   S. Kyra Manges, M.D. Attending Physician:  Lendon Colonel DD:  05/02/01 TD:  05/02/01 Job: [id number]

## 2010-11-11 ENCOUNTER — Ambulatory Visit: Payer: Self-pay | Admitting: Internal Medicine

## 2011-03-15 ENCOUNTER — Ambulatory Visit
Admission: RE | Admit: 2011-03-15 | Discharge: 2011-03-15 | Disposition: A | Discharge status: Home / Self Care | Payer: 59 | Source: Ambulatory Visit | Attending: Family Medicine | Admitting: Family Medicine

## 2011-03-15 ENCOUNTER — Other Ambulatory Visit: Payer: Self-pay | Admitting: Family Medicine

## 2011-03-15 DIAGNOSIS — R1032 Left lower quadrant pain: Secondary | ICD-10-CM

## 2011-03-15 MED ORDER — IOHEXOL 300 MG/ML  SOLN
100.0000 mL | Freq: Once | INTRAMUSCULAR | Status: AC | PRN
  Administered 2011-03-15: 100 mL via INTRAVENOUS

## 2011-03-30 ENCOUNTER — Encounter: Payer: Self-pay | Admitting: Internal Medicine

## 2011-03-30 ENCOUNTER — Ambulatory Visit (INDEPENDENT_AMBULATORY_CARE_PROVIDER_SITE_OTHER): Payer: 59 | Admitting: Internal Medicine

## 2011-03-30 ENCOUNTER — Telehealth: Payer: Self-pay | Admitting: Internal Medicine

## 2011-03-30 VITALS — BP 108/72 | HR 75 | Ht 64.0 in | Wt 164.8 lb

## 2011-03-30 DIAGNOSIS — J45909 Unspecified asthma, uncomplicated: Secondary | ICD-10-CM

## 2011-03-30 DIAGNOSIS — J984 Other disorders of lung: Secondary | ICD-10-CM

## 2011-03-30 MED ORDER — PREDNISONE (PAK) 10 MG PO TABS: ORAL_TABLET | ORAL | Status: DC

## 2011-03-30 NOTE — Progress Notes (Signed)
03/30/11-55 year old female former smoker followed for asthma complicated by hypertension and history of 3 lung nodules followed by Dr. Laneta Simmers. PCP Dr Shaune Pollack PFT 01/13/2003-FEV1 2.5 L/92%, FEV1/FVC 0.76 CT chest 08/11/2010 stable bilateral nodules without change since 2009. She reports doing well. Occasional wheeze is normal for her. She uses Advair once daily. This weekend noted increased cough, sneeze, wheeze. Peak flow dropped to 100. She started herself on prednisone 15 mg. 3 weeks ago she had been treated with antibiotics for diverticulitis(Dr. Loreta Ave).  ROS Constitutional:   No-   weight loss, night sweats, fevers, chills, fatigue, lassitude. HEENT:   No-  headaches, difficulty swallowing, tooth/dental problems, sore throat,     +sneezing, no- itching, ear ache, nasal congestion, post nasal drip,  CV:  No-   chest pain, orthopnea, PND, swelling in lower extremities, anasarca, dizziness, palpitations Resp: No-   shortness of breath with exertion or at rest.              +   productive cough,  No non-productive cough,  No-  coughing up of blood.              No-   change in color of mucus.  + wheezing.   Skin: No-   rash or lesions. GI:  No-   heartburn, indigestion, abdominal pain, nausea, vomiting, diarrhea,                 change in bowel habits, loss of appetite GU: No-   dysuria, change in color of urine, no urgency or frequency.  No- flank pain. MS:  No-   joint pain or swelling.  No- decreased range of motion.  No- back pain. Neuro- grossly normal to observation, Or:  Psych:  No- change in mood or affect. No depression or anxiety.  No memory loss.  OBJ General- Alert, Oriented, Affect-appropriate, Distress- none acute Skin- rash-none, lesions- none, excoriation- none Lymphadenopathy- none Head- atraumatic            Eyes- Gross vision intact, PERRLA, conjunctivae clear secretions            Ears- Hearing, canals            Nose- Clear, Septal dev, mucus, polyps, erosion,  perforation             Throat- Mallampati II , mucosa clear , drainage- none, tonsils- atrophic. Mild thrush (on nystatin). Husky voice. Neck- flexible , trachea midline, no stridor , thyroid nl, carotid no bruit Chest - symmetrical excursion , unlabored           Heart/CV- RRR , no murmur , no gallop  , no rub, nl s1 s2                           - JVD- none , edema- none, stasis changes- none, varices- none           Lung- clear to P&A, wheeze- none, cough- none , dullness-none, rub- none           Chest wall-  Abd- tender-no, distended-no, bowel sounds-present, HSM- no Br/ Gen/ Rectal- Not done, not indicated Extrem- cyanosis- none, clubbing, none, atrophy- none, strength- nl Neuro- grossly intact to observation

## 2011-03-30 NOTE — Telephone Encounter (Signed)
lmomtcb for pt to call back 

## 2011-03-30 NOTE — Telephone Encounter (Signed)
Patient returning call.

## 2011-03-30 NOTE — Patient Instructions (Addendum)
Try now increasing the Advair to twice daily. Be very deliberate about mough rinse- it may help to use Advair before meals.  Script for prednisone taper to use if necessary- sent

## 2011-03-30 NOTE — Telephone Encounter (Signed)
I spoke with pt and she states she has been having problems with her asthma x Saturday. Pt c/o increase SOB, dry cough, some chest congestion, nasal congestion, wheezing, little chest tightness. Pt states her peak flow has been around 100. Pt is scheduled to come in and see CDY today at 11:30

## 2011-04-01 NOTE — Assessment & Plan Note (Signed)
Recent acute symptoms associated with drop in peak flow indicate exacerbation with rhinitis and asthma. Plan-try increasing Advair to twice a day for now. Prednisone taper prescription to hold. Medications talk.

## 2011-04-01 NOTE — Assessment & Plan Note (Signed)
Nodules are probably benign after this length of followup.

## 2011-07-15 ENCOUNTER — Telehealth: Payer: Self-pay | Admitting: Internal Medicine

## 2011-07-15 MED ORDER — PREDNISONE (PAK) 10 MG PO TABS: ORAL_TABLET | ORAL | Status: DC

## 2011-07-15 NOTE — Telephone Encounter (Signed)
Pt returned call from triage. Kelsey Duran °

## 2011-07-15 NOTE — Telephone Encounter (Signed)
Per CY-okay to give Prednisone 10mg #20 take 4 x 2 days, 3 x 2 days, 2 x 2 days, 1 x 2 days, then stop no refills.  

## 2011-07-15 NOTE — Telephone Encounter (Signed)
Pt made aware of Prednisone taper and this has been sent to her pharmacy.

## 2011-07-15 NOTE — Telephone Encounter (Signed)
lmomtcb x1 

## 2011-07-15 NOTE — Telephone Encounter (Signed)
I spoke with pt w/ and she c/o cough (hard to cough anything up), chest congestion, increased SOB, chest tightness, wheezing. Denies any fever, chills, sweats, nausea, vomiting. Pt used her peak flow meter and was at 150. Pt is requesting to have prednisone called in for her into [facility]. Please advise Dr. Maple Hudson, thanks  Allergies  Allergen Reactions  . Penicillins   . Tetracycline

## 2011-08-30 ENCOUNTER — Other Ambulatory Visit: Payer: Self-pay | Admitting: Surgery

## 2011-08-30 DIAGNOSIS — D381 Neoplasm of uncertain behavior of trachea, bronchus and lung: Secondary | ICD-10-CM

## 2011-09-06 ENCOUNTER — Encounter: Payer: Self-pay | Admitting: Surgery

## 2011-09-06 ENCOUNTER — Ambulatory Visit (INDEPENDENT_AMBULATORY_CARE_PROVIDER_SITE_OTHER): Payer: 59 | Admitting: Surgery

## 2011-09-06 ENCOUNTER — Ambulatory Visit
Admission: RE | Admit: 2011-09-06 | Discharge: 2011-09-06 | Disposition: A | Discharge status: Home / Self Care | Payer: 59 | Source: Ambulatory Visit | Attending: Surgery | Admitting: Surgery

## 2011-09-06 VITALS — BP 105/73 | HR 76 | Resp 18 | Ht 63.0 in | Wt 158.0 lb

## 2011-09-06 DIAGNOSIS — J984 Other disorders of lung: Secondary | ICD-10-CM

## 2011-09-06 NOTE — Progress Notes (Signed)
                   [address]            [place] [address]          [phone]      HPI:  The patient returns to my  office today for followup of bilateral noncalcified pulmonary nodules. I last saw her on 09/07/2010 at which time the small bilateral pulmonary nodules have not changed since November of 2009. I felt these were most likely benign lesions and I recommended a followup chest x-ray in one year. She denies any significant problems over the past year. She did develop some upper respiratory symptoms a couple of months ago that have persisted but are gradually improving.  Current Outpatient Prescriptions  Medication Sig Dispense Refill  . albuterol (PROAIR HFA) 108 (90 BASE) MCG/ACT inhaler Inhale 2 puffs into the lungs every 6 (six) hours as needed.  1 Inhaler  6  . ALPRAZolam (XANAX) 0.5 MG tablet Take 1 tablet (0.5 mg total) by mouth 2 (two) times daily as needed.  60 tablet  3  . clobetasol (TEMOVATE) 0.05 % cream Apply topically 2 (two) times daily.  69 g  4  . Fluticasone-Salmeterol (ADVAIR DISKUS) 250-50 MCG/DOSE AEPB Inhale 1 puff into the lungs every 12 (twelve) hours.  60 each  4  . olmesartan-hydrochlorothiazide (BENICAR HCT) 20-12.5 MG per tablet Take 1 tablet by mouth daily.        . predniSONE (STERAPRED UNI-PAK) 10 MG tablet 4 X 2 DAYS, 3 X 2 DAYS, 2 X 2 DAYS, 1 X 2 DAYS  20 tablet  0     Physical Exam: BP 105/73  Pulse 76  Resp 18  Ht 5\' 3"  (1.6 m)  Wt 158 lb (71.668 kg)  BMI 27.99 kg/m2  SpO2 98% She looks well. Lung exam is clear. Cardiac exam shows a regular rate and rhythm with normal heart sounds.  Diagnostic Tests:  Chest x-ray today shows no acute abnormality. The previously noted pulmonary nodules seen on CT scan are not visible on chest x-ray.  Impression:  She continues to do well with a history of small bilateral pulmonary nodules that have been unchanged on CT scan over 3 year period. These were felt to be benign lesions.  I don't think there is a need for followup CT scans unless she developed a new abnormality on chest x-ray. Her chest x-ray is currently unremarkable. I have recommended that she have a followup chest x-ray in approximately 2 years and this can be done in her primary physician's office. I told her that she did not need to return to see me unless some new abnormality shows up on her chest x-ray.  Plan: She will continue to followup with her primary physician, Dr. Kevan Ny,  and should have a followup chest x-ray in about 2 years which can be scheduled by Dr. Kevan Ny. I'll be happy see her back if the need arises.

## 2011-11-28 ENCOUNTER — Telehealth: Payer: Self-pay | Admitting: Internal Medicine

## 2011-11-28 MED ORDER — ALBUTEROL SULFATE HFA 108 (90 BASE) MCG/ACT IN AERS: 2.0000 | INHALATION_SPRAY | Freq: Four times a day (QID) | RESPIRATORY_TRACT | Status: DC | PRN

## 2011-11-28 NOTE — Telephone Encounter (Signed)
rx has been sent and nothing further was needed

## 2012-02-24 ENCOUNTER — Telehealth: Payer: Self-pay | Admitting: Internal Medicine

## 2012-02-24 MED ORDER — PREDNISONE (PAK) 10 MG PO TABS: ORAL_TABLET | ORAL | Status: DC

## 2012-02-24 MED ORDER — CLARITHROMYCIN 500 MG PO TABS: 500.0000 mg | ORAL_TABLET | Freq: Two times a day (BID) | ORAL | Status: AC

## 2012-02-24 NOTE — Telephone Encounter (Signed)
Last OV 03/30/2011. Pending appt in Oct 2013.  Pt c/o increased sob, cough with dark yellow sputum, chest tightness and sneezing x 2 weeks. She says this has been ongoing for several months now. Pt denies any fever, sore throat or wheezing. Pls advise. Allergies  Allergen Reactions  . Penicillins   . Tetracycline

## 2012-02-24 NOTE — Telephone Encounter (Signed)
Per Dr. Maple Hudson  Biaxin 500mg  #14 1BID  Prednisone 10mg  #20--> 4x2 days, 3x2days, 2x2days, 1x2days then Stop

## 2012-02-24 NOTE — Telephone Encounter (Signed)
Called and spoke with pt and she is aware of rx that have been sent to the pharmacy per CY recs.  biaxin and pred dose pak have been sent.  Pt voiced her understanding and nothing further is needed.

## 2012-03-21 ENCOUNTER — Other Ambulatory Visit: Payer: 59

## 2012-03-21 ENCOUNTER — Encounter: Payer: Self-pay | Admitting: Internal Medicine

## 2012-03-21 ENCOUNTER — Ambulatory Visit (INDEPENDENT_AMBULATORY_CARE_PROVIDER_SITE_OTHER): Payer: 59 | Admitting: Internal Medicine

## 2012-03-21 VITALS — BP 110/60 | HR 95 | Ht 64.0 in | Wt 167.0 lb

## 2012-03-21 DIAGNOSIS — Z23 Encounter for immunization: Secondary | ICD-10-CM

## 2012-03-21 DIAGNOSIS — J45909 Unspecified asthma, uncomplicated: Secondary | ICD-10-CM

## 2012-03-21 DIAGNOSIS — J984 Other disorders of lung: Secondary | ICD-10-CM

## 2012-03-21 NOTE — Progress Notes (Signed)
03/30/11-56 year old female former smoker followed for asthma complicated by hypertension and history of 3 lung nodules followed by Dr. Laneta Simmers. PCP Dr Shaune Pollack PFT 01/13/2003-FEV1 2.5 L/92%, FEV1/FVC 0.76 CT chest 08/11/2010 stable bilateral nodules without change since 2009. She reports doing well. Occasional wheeze is normal for her. She uses Advair once daily. This weekend noted increased cough, sneeze, wheeze. Peak flow dropped to 100. She started herself on prednisone 15 mg. 3 weeks ago she had been treated with antibiotics for diverticulitis(Dr. Loreta Ave).  03/21/12- 56 year old female former smoker followed for asthma complicated by hypertension and history of 3 lung nodules followed by Dr. Laneta Simmers. PCP Dr Shaune Pollack Increased SOB since last visit; wheezing, was recently given abx and pred; still coughing-yellow in color but not as thick now; Would like to get Tdap shot today. Gets flu vaccine at school Peak flow running around 200. A good day is 300. Office spirometry 03/21/2012-FEV1 1.50/59%, FVC 2.67/84%, FEV1/FVC 0.56 FEF 25-75% 0.54. Moderate obstructive airways disease. CXR 09/06/11 IMPRESSION:  No active lung disease. Small lung nodules noted on CT of the  chest are not visible on chest x-ray. Thoracolumbar scoliosis.  Original Report Authenticated By: Juline Patch, M.D.   ROS- see HPI Constitutional:   No-   weight loss, night sweats, fevers, chills, fatigue, lassitude. HEENT:   No-  headaches, difficulty swallowing, tooth/dental problems, sore throat,     +sneezing, no- itching, ear ache, nasal congestion, post nasal drip,  CV:  No-   chest pain, orthopnea, PND, swelling in lower extremities, anasarca, dizziness, palpitations Resp: +  shortness of breath with exertion or at rest.              +   productive cough,  No non-productive cough,  No-  coughing up of blood.              No-   change in color of mucus.  + wheezing.   Skin: No-   rash or lesions. GI:  No-   heartburn,  indigestion, abdominal pain, nausea, vomiting,  GU:  MS:  No-   joint pain or swelling.   Neuro- nothing unusual Psych:  No- change in mood or affect. No depression or anxiety.  No memory loss.  OBJ General- Alert, Oriented, Affect-appropriate, Distress- none acute Skin- rash-none, lesions- none, excoriation- none Lymphadenopathy- none Head- atraumatic            Eyes- Gross vision intact, PERRLA, conjunctivae clear secretions            Ears- Hearing, canals            Nose- Clear, Septal dev, mucus, polyps, erosion, perforation             Throat- Mallampati II , mucosa clear , drainage- none, tonsils- atrophic. Mild thrush (on nystatin). Husky voice. Neck- flexible , trachea midline, no stridor , thyroid nl, carotid no bruit Chest - symmetrical excursion , unlabored           Heart/CV- RRR , no murmur , no gallop  , no rub, nl s1 s2                           - JVD- none , edema- none, stasis changes- none, varices- none           Lung- clear to P&A, wheeze- minimal, + loose cough , dullness-none, rub- none           Chest  wall-  Abd-  Br/ Gen/ Rectal- Not done, not indicated Extrem- cyanosis- none, clubbing, none, atrophy- none, strength- nl Neuro- grossly intact to observation

## 2012-03-21 NOTE — Patient Instructions (Addendum)
Office spirometry   Dx asthma with bronchitis  Order- Sputum culture- routine and AFB    Dx asthma with bronchitis  TDAP  Order- lab- Allergy profile  Sample Spiriva    1 daily  Sample Advair 500   1 puff then rinse mouth well, twice daily     Use this up then return to your usual Advair 250

## 2012-03-22 LAB — ALLERGY FULL PROFILE
Allergen,Goose feathers, e70: 0.14 kU/L — ABNORMAL HIGH
Alternaria Alternata: <0.1 kU/L
Bermuda Grass: 0.18 kU/L — ABNORMAL HIGH
Box Elder IgE: 0.24 kU/L — ABNORMAL HIGH
Common Ragweed: 0.71 kU/L — ABNORMAL HIGH
Curvularia lunata: <0.1 kU/L
D. farinae: 1 kU/L — ABNORMAL HIGH
Dog Dander: 37 kU/L — ABNORMAL HIGH
Fescue: <0.1 kU/L
G009 Red Top: <0.1 kU/L
House Dust Hollister: 16.3 kU/L — ABNORMAL HIGH
IgE (Immunoglobulin E), Serum: 255.3 IU/mL — ABNORMAL HIGH (ref 0.0–180.0)
Lamb's Quarters: 0.22 kU/L — ABNORMAL HIGH
Oak: 0.25 kU/L — ABNORMAL HIGH
Sycamore Tree: 0.23 kU/L — ABNORMAL HIGH

## 2012-03-23 ENCOUNTER — Other Ambulatory Visit: Payer: 59

## 2012-03-23 DIAGNOSIS — J45909 Unspecified asthma, uncomplicated: Secondary | ICD-10-CM

## 2012-03-23 NOTE — Progress Notes (Signed)
Quick Note:  LMTCB ______ 

## 2012-03-26 LAB — RESPIRATORY CULTURE OR RESPIRATORY AND SPUTUM CULTURE
Gram Stain: NONE SEEN
Gram Stain: NONE SEEN
Organism ID, Bacteria: NORMAL

## 2012-03-29 ENCOUNTER — Ambulatory Visit: Payer: 59 | Admitting: Internal Medicine

## 2012-03-30 NOTE — Assessment & Plan Note (Signed)
Being followed by Dr. Laneta Simmers [facility]

## 2012-03-30 NOTE — Assessment & Plan Note (Signed)
Sustained exacerbation through the summer. Plan-instead of Advair 250 try a sample Dulera 200, allergy profile, sputum culture She may have the TDAP she requested

## 2012-04-03 ENCOUNTER — Telehealth: Payer: Self-pay | Admitting: Internal Medicine

## 2012-04-03 MED ORDER — PREDNISONE 10 MG PO TABS: ORAL_TABLET | ORAL | Status: DC

## 2012-04-03 NOTE — Telephone Encounter (Signed)
Per CY---  1.  Try prednisone 10 mg    4 tabs x 3 days, 3 tabs x 3 days, 2 tabs x 3 days, 1 tab x 3 days.  #30  2.  Schedule pt for skin test---will have to use December 4.   lmomtcb for the pt to make her aware.

## 2012-04-03 NOTE — Telephone Encounter (Signed)
Spoke with pt and notified of recs per CDY She verbalized understanding Rx for pred taper was sent to pharm

## 2012-04-03 NOTE — Telephone Encounter (Signed)
   Result Note     Allergy profile - Broadly elevated allergy antibody levels. Allergy skin testing may be an option.   I spoke with patient about results and she verbalized understanding and had no questions. She stated she wants to do allergy testing. Reason being is she stated when she was having a hard time was after she had bathed her dog and before she even could get home she notices difficutly breathing. She also stated the spiriva and advair 500 is not helping either. She has not noticed any improvement. She is wanting to know if she could be put back on prednisone starting at a high dose and slowly taper down. Please advise Dr. Maple Hudson thanks

## 2012-04-03 NOTE — Telephone Encounter (Signed)
Returning call can be reached at [phone].Kelsey Duran ° °

## 2012-04-24 ENCOUNTER — Encounter: Payer: Self-pay | Admitting: Internal Medicine

## 2012-04-24 ENCOUNTER — Ambulatory Visit (INDEPENDENT_AMBULATORY_CARE_PROVIDER_SITE_OTHER): Payer: 59 | Admitting: Internal Medicine

## 2012-04-24 VITALS — BP 118/66 | HR 86 | Ht 64.0 in | Wt 164.6 lb

## 2012-04-24 DIAGNOSIS — K219 Gastro-esophageal reflux disease without esophagitis: Secondary | ICD-10-CM

## 2012-04-24 DIAGNOSIS — J45909 Unspecified asthma, uncomplicated: Secondary | ICD-10-CM

## 2012-04-24 DIAGNOSIS — R079 Chest pain, unspecified: Secondary | ICD-10-CM

## 2012-04-24 DIAGNOSIS — J45998 Other asthma: Secondary | ICD-10-CM

## 2012-04-24 DIAGNOSIS — R002 Palpitations: Secondary | ICD-10-CM

## 2012-04-24 MED ORDER — ALBUTEROL SULFATE HFA 108 (90 BASE) MCG/ACT IN AERS: 2.0000 | INHALATION_SPRAY | RESPIRATORY_TRACT | Status: DC | PRN

## 2012-04-24 MED ORDER — FLUTICASONE-SALMETEROL 250-50 MCG/DOSE IN AEPB: INHALATION_SPRAY | RESPIRATORY_TRACT | Status: DC

## 2012-04-24 NOTE — Progress Notes (Signed)
03/30/11-56 year old female former smoker followed for asthma complicated by hypertension and history of 3 lung nodules followed by Dr. Laneta Simmers. PCP Dr Shaune Pollack PFT 01/13/2003-FEV1 2.5 L/92%, FEV1/FVC 0.76 CT chest 08/11/2010 stable bilateral nodules without change since 2009. She reports doing well. Occasional wheeze is normal for her. She uses Advair once daily. This weekend noted increased cough, sneeze, wheeze. Peak flow dropped to 100. She started herself on prednisone 15 mg. 3 weeks ago she had been treated with antibiotics for diverticulitis(Dr. Loreta Ave).  03/21/12- 56 year old female former smoker followed for asthma complicated by hypertension and history of 3 lung nodules followed by Dr. Laneta Simmers. PCP Dr Shaune Pollack Increased SOB since last visit; wheezing, was recently given abx and pred; still coughing-yellow in color but not as thick now; Would like to get Tdap shot today. Gets flu vaccine at school Peak flow running around 200. A good day is 300. Office spirometry 03/21/2012-FEV1 1.50/59%, FVC 2.67/84%, FEV1/FVC 0.56 FEF 25-75% 0.54. Moderate obstructive airways disease. CXR 09/06/11 IMPRESSION:  No active lung disease. Small lung nodules noted on CT of the  chest are not visible on chest x-ray. Thoracolumbar scoliosis.  Original Report Authenticated By: Juline Patch, M.D.   04/24/12- 56 year old female former smoker followed for asthma complicated by hypertension and history of 3 lung nodules followed by Dr. Laneta Simmers. PCP Dr Shaune Pollack Denies any problems with her breathing. Cough has cleared up. Occasional palpitations, jaw pain, tingling right arm. Asks cardiology evaluation. Easy dyspnea on exertion lifting. Dizzy spells. Family history of heart disease. Took Xanax before coming today. Works 7 days per week, much stress, tired. Aware of heart burn. Continues Advair 500, Spiriva. Finishing prednisone taper. 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 Allergy profile 03/22/2012- total IgE 255.3 with significant elevations especially for dust mites, animal danders, grass and tree pollens EKG 04/24/12-sinus rhythm, NSSTTW changes.   ROS- see HPI Constitutional:   No-   weight loss, night sweats, fevers, chills, fatigue, lassitude. HEENT:   No-  headaches, difficulty swallowing, tooth/dental problems, sore throat,     +sneezing, no- itching, ear ache, nasal congestion, post nasal drip,  CV:  +chest pain, orthopnea, PND, swelling in lower extremities, anasarca, dizziness, +palpitations Resp: +  shortness of breath with exertion or at rest.              +   productive cough,  No non-productive cough,  No-  coughing up of blood.              No-   change in color of mucus.  + Occasional wheezing.   Skin: No-   rash or lesions. GI:  +   heartburn, indigestion, no-abdominal pain, no-nausea, vomiting,  GU:  MS:  No-   joint pain or swelling.   Neuro- nothing unusual Psych:  No- change in mood or affect. + depression or anxiety.  No memory loss.  OBJ General- Alert, Oriented, Affect-appropriate, Distress- none acute Skin- rash-none, lesions- none, excoriation- none Lymphadenopathy- none Head- atraumatic            Eyes- Gross vision intact, PERRLA, conjunctivae clear secretions            Ears- Hearing, canals            Nose- Clear, Septal dev, mucus, polyps, erosion, perforation             Throat- Mallampati II ,  mucosa clear , drainage- none, tonsils- atrophic.  Neck- flexible , trachea midline, no stridor , thyroid nl, carotid no bruit Chest - symmetrical excursion , unlabored           Heart/CV- RRR , no murmur , no gallop  , no rub, nl s1 s2                           - JVD- none , edema- none, stasis changes- none, varices- none           Lung- clear to P&A, wheeze- none,  cough-none , dullness-none, rub- none           Chest wall-  Abd-  Br/ Gen/ Rectal- Not done, not  indicated Extrem- cyanosis- none, clubbing, none, atrophy- none, strength- nl Neuro- grossly intact to observation

## 2012-04-24 NOTE — Patient Instructions (Addendum)
Order PFT   Dx asthma with bronchitis  Refill script for Advair and albuterol HFA for mail order  Order- referral to Cardiology for c/o chest pain/ palpitations and risk stratification- EKG done  Recommend you start an otc acid blocker like Pepcid/ famotidine, or Prilosec/ omeprazole, once every day before breakfast

## 2012-04-25 ENCOUNTER — Encounter: Payer: Self-pay | Admitting: Cardiovascular Disease

## 2012-04-25 ENCOUNTER — Ambulatory Visit (INDEPENDENT_AMBULATORY_CARE_PROVIDER_SITE_OTHER): Payer: 59 | Admitting: Cardiovascular Disease

## 2012-04-25 VITALS — BP 140/58 | HR 88 | Ht 63.0 in | Wt 164.4 lb

## 2012-04-25 DIAGNOSIS — R0600 Dyspnea, unspecified: Secondary | ICD-10-CM

## 2012-04-25 DIAGNOSIS — R079 Chest pain, unspecified: Secondary | ICD-10-CM

## 2012-04-25 DIAGNOSIS — I10 Essential (primary) hypertension: Secondary | ICD-10-CM

## 2012-04-25 DIAGNOSIS — R0609 Other forms of dyspnea: Secondary | ICD-10-CM | POA: Insufficient documentation

## 2012-04-25 NOTE — Progress Notes (Signed)
Patient ID: Kelsey Duran, female   DOB: September 25, 1955, 55 y.o.   MRN: [id number] 56 yo referred by Dr Maple Hudson for dyspnea and chest pain.  Multiple somatic complaints. Long standing asthma with recent flair and prednisone taper. Occasional jaw, left arm and chest pain.  Not necessarily exertional.  Sometimes related to dyspnea.  Breathing has not been good last couple of months.  No history of CAD  No previous stress test.  Family history of CAD.  No fever sputum History of lung nodules followed by Dr Laneta Simmers  No history of pulmonary hypertension.  Chest pain is intermitant not daily lasts less than a minute   ROS: Denies fever, malais, weight loss, blurry vision, decreased visual acuity, cough, sputum, SOB, hemoptysis, pleuritic pain, palpitaitons, heartburn, abdominal pain, melena, lower extremity edema, claudication, or rash.  All other systems reviewed and negative   General: Affect appropriate Healthy:  appears stated age HEENT: normal Neck supple with no adenopathy JVP normal no bruits no thyromegaly Lungs clear with no wheezing and good diaphragmatic motion Heart:  S1/S2 no murmur,rub, gallop or click PMI normal Abdomen: benighn, BS positve, no tenderness, no AAA no bruit.  No HSM or HJR Distal pulses intact with no bruits No edema Neuro non-focal Skin warm and dry No muscular weakness  Medications Current Outpatient Prescriptions  Medication Sig Dispense Refill  . albuterol (PROAIR HFA) 108 (90 BASE) MCG/ACT inhaler Inhale 2 puffs into the lungs every 4 (four) hours as needed for wheezing or shortness of breath.  3 Inhaler  3  . ALPRAZolam (XANAX) 0.5 MG tablet Take 1 tablet (0.5 mg total) by mouth 2 (two) times daily as needed.  60 tablet  3  . clobetasol (TEMOVATE) 0.05 % cream Apply topically 2 (two) times daily.  69 g  4  . Fluticasone-Salmeterol (ADVAIR DISKUS) 250-50 MCG/DOSE AEPB 1 puff then rinse mouth, twice daily  180 each  3  . olmesartan-hydrochlorothiazide (BENICAR HCT)  20-12.5 MG per tablet Take 1 tablet by mouth daily.        . predniSONE (DELTASONE) 10 MG tablet 4 x 3 days, 3 x 3 days, 2 x 3 days, 1 x 3 days then stop  30 tablet  0    Allergies Penicillins and Tetracycline  Family History: Family History  Problem Relation Age of Onset  . Heart attack Mother   . Stroke Sister   . Pancreatitis Father     Social History: History   Social History  . Marital Status: Married    Spouse Name: N/A    Number of Children: 1  . Years of Education: N/A   Occupational History  .  [employer]   Social History Main Topics  . Smoking status: Former Smoker -- 1.0 packs/day for 8 years    Types: Cigarettes    Quit date: 06/21/1983  . Smokeless tobacco: Never Used  . Alcohol Use: Yes  . Drug Use: Yes  . Sexually Active: Not on file   Other Topics Concern  . Not on file   Social History Narrative  . No narrative on file    Electrocardiogram:  SR rate 77 nonspecific St/T wave changes done 04/24/12  Assessment and Plan

## 2012-04-25 NOTE — Assessment & Plan Note (Signed)
Likely related to asthma  Check echo for RV and LV functin.  CXR reviewed 3/13 and NAD nodules not seen only by CT  F/U asthma with [facility] and lung nodules with [facility]

## 2012-04-25 NOTE — Patient Instructions (Signed)
Your physician recommends that you schedule a follow-up appointment in:  AS NEEDED  Your physician recommends that you continue on your current medications as directed. Please refer to the Current Medication list given to you today. Your physician has requested that you have an echocardiogram. Echocardiography is a painless test that uses sound waves to create images of your heart. It provides your doctor with information about the size and shape of your heart and how well your heart's chambers and valves are working. This procedure takes approximately one hour. There are no restrictions for this procedure. DX DYSPNEA  Your physician has requested that you have en exercise stress myoview. For further information please visit [email or web address] Please follow instruction sheet, as given. DX CHEST PAIN

## 2012-04-25 NOTE — Assessment & Plan Note (Signed)
Atypical nonspecific St/T wave changes on ECG  F/U exercise myovue

## 2012-04-25 NOTE — Assessment & Plan Note (Signed)
Well controlled.  Continue current medications and low sodium Dash type diet.    

## 2012-04-27 ENCOUNTER — Ambulatory Visit (HOSPITAL_COMMUNITY): Payer: 59 | Attending: Cardiology | Admitting: Radiology

## 2012-04-27 DIAGNOSIS — R0989 Other specified symptoms and signs involving the circulatory and respiratory systems: Secondary | ICD-10-CM | POA: Insufficient documentation

## 2012-04-27 DIAGNOSIS — R0600 Dyspnea, unspecified: Secondary | ICD-10-CM

## 2012-04-27 DIAGNOSIS — I369 Nonrheumatic tricuspid valve disorder, unspecified: Secondary | ICD-10-CM | POA: Insufficient documentation

## 2012-04-27 DIAGNOSIS — R0609 Other forms of dyspnea: Secondary | ICD-10-CM | POA: Insufficient documentation

## 2012-04-27 DIAGNOSIS — R0602 Shortness of breath: Secondary | ICD-10-CM

## 2012-04-27 DIAGNOSIS — I10 Essential (primary) hypertension: Secondary | ICD-10-CM | POA: Insufficient documentation

## 2012-04-27 NOTE — Progress Notes (Signed)
Echocardiogram performed.  

## 2012-04-30 ENCOUNTER — Ambulatory Visit (INDEPENDENT_AMBULATORY_CARE_PROVIDER_SITE_OTHER): Payer: 59 | Admitting: Internal Medicine

## 2012-04-30 DIAGNOSIS — J45909 Unspecified asthma, uncomplicated: Secondary | ICD-10-CM

## 2012-04-30 LAB — PULMONARY FUNCTION TEST

## 2012-04-30 NOTE — Progress Notes (Signed)
PFT done today. 

## 2012-05-03 ENCOUNTER — Ambulatory Visit (HOSPITAL_COMMUNITY): Payer: 59 | Attending: Cardiovascular Disease | Admitting: Radiology

## 2012-05-03 VITALS — BP 124/76 | HR 69 | Ht 63.0 in | Wt 161.0 lb

## 2012-05-03 DIAGNOSIS — R9431 Abnormal electrocardiogram [ECG] [EKG]: Secondary | ICD-10-CM

## 2012-05-03 DIAGNOSIS — I10 Essential (primary) hypertension: Secondary | ICD-10-CM | POA: Insufficient documentation

## 2012-05-03 DIAGNOSIS — R55 Syncope and collapse: Secondary | ICD-10-CM | POA: Insufficient documentation

## 2012-05-03 DIAGNOSIS — R0989 Other specified symptoms and signs involving the circulatory and respiratory systems: Secondary | ICD-10-CM | POA: Insufficient documentation

## 2012-05-03 DIAGNOSIS — R0602 Shortness of breath: Secondary | ICD-10-CM

## 2012-05-03 DIAGNOSIS — R079 Chest pain, unspecified: Secondary | ICD-10-CM

## 2012-05-03 DIAGNOSIS — Z8249 Family history of ischemic heart disease and other diseases of the circulatory system: Secondary | ICD-10-CM | POA: Insufficient documentation

## 2012-05-03 DIAGNOSIS — R0789 Other chest pain: Secondary | ICD-10-CM | POA: Insufficient documentation

## 2012-05-03 DIAGNOSIS — R0609 Other forms of dyspnea: Secondary | ICD-10-CM | POA: Insufficient documentation

## 2012-05-03 MED ORDER — TECHNETIUM TC 99M SESTAMIBI GENERIC - CARDIOLITE
10.0000 | Freq: Once | INTRAVENOUS | Status: AC | PRN
  Administered 2012-05-03: 10 via INTRAVENOUS

## 2012-05-03 MED ORDER — TECHNETIUM TC 99M SESTAMIBI GENERIC - CARDIOLITE
30.0000 | Freq: Once | INTRAVENOUS | Status: AC | PRN
  Administered 2012-05-03: 30 via INTRAVENOUS

## 2012-05-03 NOTE — Progress Notes (Signed)
[facility] SITE 3 NUCLEAR MED [address] [id number] [place] [address] [phone]  Cardiology Nuclear Med Study  Kelsey Duran is a 56 y.o. female     MRN : [id number]     DOB: 1955/08/01  Procedure Date: 05/03/2012  Nuclear Med Background Indication for Stress Test:  Evaluation for Ischemia and Abnormal EKG History:  04/27/12 ECHO: EF: 65-70% Cardiac Risk Factors: Family History - CAD, History of Smoking and Hypertension  Symptoms:  Chest Pain/Pressure/Tightness>(L) Arm/Jaw with Exertion (last episode of chest discomfort was yesterday), DOE, Fatigue, Light-Headedness, Near Syncope, Rapid HR and SOB with Chest Pain   Nuclear Pre-Procedure Caffeine/Decaff Intake:  None NPO After: 7:30pm   Lungs:  Expiratory wheezes.  Pro Air used prior to exercise at 9:10 am, wheezes cleared.  O2 Sat: 99% on room air. IV 0.9% NS with Angio Cath:  22g  IV Site: R Antecubital  IV Started by:  Milana Na, EMT-P  Chest Size (in):  36 Cup Size: C/D  Height: 5\' 3"  (1.6 m)  Weight:  161 lb (73.029 kg)  BMI:  Body mass index is 28.52 kg/(m^2). Tech CommentsInternational aid/development worker used at 7:30 am     Nuclear Med Study 1 or 2 day study: 1 day  Stress Test Type:  Stress  Reading MD: Kristeen Miss, MD  Order Authorizing Provider:  Charlton Haws MD  Resting Radionuclide: Technetium 17m Sestamibi  Resting Radionuclide Dose: 11.0 mCi   Stress Radionuclide:  Technetium 61m Sestamibi  Stress Radionuclide Dose:33.           Stress Protocol Rest HR: 69 Stress HR: 146  Rest BP: 124/76 Stress BP: 169/77  Exercise Time (min): 5:30 METS: 7.0   Predicted Max HR: 164 bpm % Max HR: 89.02 bpm Rate Pressure Product: 56433   Dose of Adenosine (mg):  n/a Dose of Lexiscan: n/a mg  Dose of Atropine (mg): n/a Dose of Dobutamine: n/a mcg/kg/min (at max HR)  Stress Test Technologist: Smiley Houseman, CMA-N  Nuclear Technologist:  Domenic Polite, CNMT     Rest Procedure:  Myocardial  perfusion imaging was performed at rest 45 minutes following the intravenous administration of Technetium 5m Sestamibi.  Rest ECG: Nonspecific ST-T wave changes.  Stress Procedure:  The patient exercised on the treadmill utilizing the Bruce protocol for 5:30 minutes. She then stopped due to fatigue and dyspnea with an O2 SAT of 98% at peak exercise.  She c/o chest pressure, 7-8/10 at peak exercise, but couldn't decide if it was from breathing or heart, it quickly went away with rest.  There were no diagnostic ST-T wave changes, there was some nonspecific changes.  Technetium 77m Sestamibi was injected at peak exercise and myocardial perfusion imaging was performed after a brief delay.  Stress ECG: No significant change from baseline ECG  QPS Raw Data Images:  Normal; no motion artifact; normal heart/lung ratio. Stress Images:  Normal homogeneous uptake in all areas of the myocardium. Rest Images:  Normal homogeneous uptake in all areas of the myocardium. Subtraction (SDS):  No evidence of ischemia. Transient Ischemic Dilatation (Normal <1.22):  0.93 Lung/Heart Ratio (Normal <0.45):  0.28  Quantitative Gated Spect Images QGS EDV:  53 ml QGS ESV:  10 ml  Impression Exercise Capacity:  Fair exercise capacity. BP Response:  Normal blood pressure response. Clinical Symptoms:  No significant symptoms noted. ECG Impression:  No significant ST segment change suggestive of ischemia. Comparison with Prior Nuclear Study: No previous nuclear study performed  Overall Impression:  Normal stress nuclear study.  No evidence of ischemia.  LV Ejection Fraction: 80%.  LV Wall Motion:  NL LV Function; NL Wall Motion  .

## 2012-05-06 DIAGNOSIS — K219 Gastro-esophageal reflux disease without esophagitis: Secondary | ICD-10-CM | POA: Insufficient documentation

## 2012-05-06 NOTE — Assessment & Plan Note (Addendum)
Good control Plan-update PFT. Switch from Advair 500 to Advair 250.

## 2012-05-06 NOTE — Assessment & Plan Note (Signed)
Plan-daily acid blocker before breakfast.

## 2012-05-06 NOTE — Assessment & Plan Note (Addendum)
She makes pretty clear that she is tired, overworked and a bit depressed. She recognizes that she has heartburn. She is asking for cardiology referral because of palpitations and family hx. EKG today looks normal.

## 2012-05-07 ENCOUNTER — Telehealth: Payer: Self-pay | Admitting: Cardiovascular Disease

## 2012-05-07 NOTE — Telephone Encounter (Signed)
PT AWARE OF MYOVIEW RESULTS./CY 

## 2012-05-07 NOTE — Telephone Encounter (Signed)
New Problem:    Called in returning your call from 05/04/12.  Please call back.

## 2012-05-11 NOTE — Progress Notes (Signed)
Quick Note:  LMTCB ______ 

## 2012-05-23 ENCOUNTER — Other Ambulatory Visit: Payer: 59

## 2012-05-23 ENCOUNTER — Ambulatory Visit (INDEPENDENT_AMBULATORY_CARE_PROVIDER_SITE_OTHER): Payer: 59 | Admitting: Internal Medicine

## 2012-05-23 ENCOUNTER — Encounter: Payer: Self-pay | Admitting: Internal Medicine

## 2012-05-23 VITALS — BP 90/72 | HR 87 | Ht 63.0 in | Wt 162.8 lb

## 2012-05-23 DIAGNOSIS — J45909 Unspecified asthma, uncomplicated: Secondary | ICD-10-CM

## 2012-05-23 DIAGNOSIS — Z91018 Allergy to other foods: Secondary | ICD-10-CM

## 2012-05-23 DIAGNOSIS — T781XXA Other adverse food reactions, not elsewhere classified, initial encounter: Secondary | ICD-10-CM

## 2012-05-23 DIAGNOSIS — J984 Other disorders of lung: Secondary | ICD-10-CM

## 2012-05-23 DIAGNOSIS — J3089 Other allergic rhinitis: Secondary | ICD-10-CM

## 2012-05-23 DIAGNOSIS — J302 Other seasonal allergic rhinitis: Secondary | ICD-10-CM | POA: Insufficient documentation

## 2012-05-23 DIAGNOSIS — J45998 Other asthma: Secondary | ICD-10-CM

## 2012-05-23 DIAGNOSIS — J309 Allergic rhinitis, unspecified: Secondary | ICD-10-CM

## 2012-05-23 MED ORDER — THEOPHYLLINE ER 100 MG PO TB12: ORAL_TABLET | ORAL | Status: DC

## 2012-05-23 MED ORDER — AZELASTINE-FLUTICASONE 137-50 MCG/ACT NA SUSP: 2.0000 | Freq: Every day | NASAL | Status: DC

## 2012-05-23 NOTE — Assessment & Plan Note (Addendum)
Strong positive skin test reactions. Allergy vaccine may become an option again. She might also be a Xolair candidate. There is a significant fixed obstructive component and I explained COPD to her. She has asked about trying theophylline again. I discussed this carefully and we decided to try a low dose.

## 2012-05-23 NOTE — Patient Instructions (Addendum)
Order- lab- alpha gal IgE    Dx food allergy    [phone]  Sample Dymista nasal spray     1-2 puffs each nostril once every day at bedtime. Watch for improvement in hoarseness and nasal symptoms  Script for theophylline bronchodilator tabs    Take twice daily with meals.   Watch for improved chest/ breathing   Watch out for insomnia, nervousness, heart burn

## 2012-05-23 NOTE — Assessment & Plan Note (Signed)
Plan-she understands to avoid foods which cause symptoms. We discussed need to intolerance suggested by the skin testing. Plan- lab for IgE to alpha-gal.

## 2012-05-23 NOTE — Assessment & Plan Note (Signed)
Plan-sample Dymista. nasal spray 

## 2012-05-23 NOTE — Progress Notes (Signed)
03/30/11-56 year old female former smoker followed for asthma complicated by hypertension and history of 3 lung nodules followed by Dr. Laneta Simmers. PCP Dr Shaune Pollack PFT 01/13/2003-FEV1 2.5 L/92%, FEV1/FVC 0.76 CT chest 08/11/2010 stable bilateral nodules without change since 2009. She reports doing well. Occasional wheeze is normal for her. She uses Advair once daily. This weekend noted increased cough, sneeze, wheeze. Peak flow dropped to 100. She started herself on prednisone 15 mg. 3 weeks ago she had been treated with antibiotics for diverticulitis(Dr. Loreta Ave).  03/21/12- 56 year old female former smoker followed for asthma complicated by hypertension and history of 3 lung nodules followed by Dr. Laneta Simmers. PCP Dr Shaune Pollack Increased SOB since last visit; wheezing, was recently given abx and pred; still coughing-yellow in color but not as thick now; Would like to get Tdap shot today. Gets flu vaccine at school Peak flow running around 200. A good day is 300. Office spirometry 03/21/2012-FEV1 1.50/59%, FVC 2.67/84%, FEV1/FVC 0.56 FEF 25-75% 0.54. Moderate obstructive airways disease. CXR 09/06/11 IMPRESSION:  No active lung disease. Small lung nodules noted on CT of the  chest are not visible on chest x-ray. Thoracolumbar scoliosis.  Original Report Authenticated By: Juline Patch, M.D.   04/24/12- 56 year old female former smoker followed for asthma complicated by hypertension and history of 3 lung nodules followed by Dr. Laneta Simmers. PCP Dr Shaune Pollack Denies any problems with her breathing. Cough has cleared up. Occasional palpitations, jaw pain, tingling right arm. Asks cardiology evaluation. Easy dyspnea on exertion lifting. Dizzy spells. Family history of heart disease. Took Xanax before coming today. Works 7 days per week, much stress, tired. Aware of heart burn. Continues Advair 500, Spiriva. Finishing prednisone taper. 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 Allergy profile 03/22/2012- total IgE 255.3 with significant elevations especially for dust mites, animal danders, grass and tree pollens EKG 04/24/12-sinus rhythm, NSSTTW changes.  05/23/12- 56 year old female former smoker followed for asthma complicated by hypertension and history of 3 lung nodules followed by Dr. Laneta Simmers. PCP Dr Shaune Pollack No antihistamines, OTC cough syrups, or sleep meds in past 2 days  Sputum 03/23/12- Neg AF[employer], Nl [place]ora Hoarseness and raspy cough come and go- not different this Fall. She stopped aspirin she was taking daily for joint pains, and thinks her breathing might have improved. Discussed inhalers. She asks about re-trying theophylline- discussed. Wants to lose weight.  No change with fall weather. Frequent cough and hoarseness, usually not productive. Allergy skin test 05/23/12- strong puncture reactions for ragweed, tree pollens, dust mite, cat, dog, coarse. Also to beef pork and lamb. We discussed these reactions and I made distinction between environmental respiratory allergies and foods. She understands to avoid foods which cause symptoms but has not been aware of problems with any meats. PFT: 04/30/2012-moderate obstructive airways disease with response to bronchodilator, air trapping, diffusion mildly reduced. FEV1 1.92/81%, FEV1/FVC 0.59, RV 134%, DLCO 74%  ROS- see HPI Constitutional:   No-   weight loss, night sweats, fevers, chills, fatigue, lassitude. HEENT:   No-  headaches, difficulty swallowing, tooth/dental problems, sore throat,     +sneezing, no- itching, ear ache, nasal congestion, post nasal drip,  CV:  +chest pain, orthopnea, PND, swelling in lower extremities, anasarca, dizziness, +palpitations Resp: +  shortness of breath with exertion or at rest.             No-  productive cough,  + non-productive cough,  No-  coughing up of blood.              No-   change in color of mucus.  + Occasional  wheezing.   Skin: No-   rash or lesions. GI:  +   heartburn, indigestion, no-abdominal pain, no-nausea, vomiting,  GU:  MS:  No-   joint pain or swelling.   Neuro- nothing unusual Psych:  No- change in mood or affect. + depression or anxiety.  No memory loss.  OBJ General- Alert, Oriented, Affect-appropriate, Distress- none acute Skin- rash-none, lesions- none, excoriation- none Lymphadenopathy- none Head- atraumatic            Eyes- Gross vision intact, PERRLA, conjunctivae clear secretions            Ears- Hearing, canals            Nose- Clear, Septal dev, mucus, polyps, erosion, perforation             Throat- Mallampati II , mucosa clear , drainage- none, tonsils- atrophic.  Neck- flexible , trachea midline, no stridor , thyroid nl, carotid no bruit Chest - symmetrical excursion , unlabored           Heart/CV- RRR , no murmur , no gallop  , no rub, nl s1 s2                           - JVD- none , edema- none, stasis changes- none, varices- none           Lung- clear to P&A, wheeze- none,  cough-none , dullness-none, rub- none           Chest wall-  Abd-  Br/ Gen/ Rectal- Not done, not indicated Extrem- cyanosis- none, clubbing, none, atrophy- none, strength- nl Neuro- grossly intact to observation

## 2012-05-30 NOTE — Progress Notes (Signed)
Quick Note:  Spoke with pt and notified of results per Dr. Young. Pt verbalized understanding and denied any questions.  ______ 

## 2012-06-11 ENCOUNTER — Encounter: Payer: Self-pay | Admitting: Internal Medicine

## 2012-06-25 ENCOUNTER — Ambulatory Visit: Payer: 59 | Admitting: Internal Medicine

## 2012-07-05 ENCOUNTER — Ambulatory Visit (INDEPENDENT_AMBULATORY_CARE_PROVIDER_SITE_OTHER): Payer: 59 | Admitting: Internal Medicine

## 2012-07-05 ENCOUNTER — Encounter: Payer: Self-pay | Admitting: Internal Medicine

## 2012-07-05 VITALS — BP 106/70 | HR 84 | Ht 63.0 in | Wt 163.6 lb

## 2012-07-05 DIAGNOSIS — J302 Other seasonal allergic rhinitis: Secondary | ICD-10-CM

## 2012-07-05 DIAGNOSIS — J309 Allergic rhinitis, unspecified: Secondary | ICD-10-CM

## 2012-07-05 DIAGNOSIS — J449 Chronic obstructive pulmonary disease, unspecified: Secondary | ICD-10-CM

## 2012-07-05 MED ORDER — FLUTICASONE PROPIONATE 50 MCG/ACT NA SUSP: 2.0000 | Freq: Every day | NASAL | Status: DC

## 2012-07-05 MED ORDER — AZELASTINE-FLUTICASONE 137-50 MCG/ACT NA SUSP: 2.0000 | Freq: Every day | NASAL | Status: DC

## 2012-07-05 MED ORDER — AZELASTINE HCL 0.1 % NA SOLN: NASAL | Status: DC

## 2012-07-05 MED ORDER — THEOPHYLLINE ER 100 MG PO TB12: ORAL_TABLET | ORAL | Status: DC

## 2012-07-05 NOTE — Progress Notes (Signed)
03/30/11-57 year old female former smoker followed for asthma complicated by hypertension and history of 3 lung nodules followed by Dr. Cyndia Bent. PCP Dr Darcus Austin PFT 01/13/2003-FEV1 2.5 L/92%, FEV1/FVC 0.76 CT chest 08/11/2010 stable bilateral nodules without change since 2009. She reports doing well. Occasional wheeze is normal for her. She uses Advair once daily. This weekend noted increased cough, sneeze, wheeze. Peak flow dropped to 100. She started herself on prednisone 15 mg. 3 weeks ago she had been treated with antibiotics for diverticulitis(Dr. Collene Mares).  03/21/12- 57 year old female former smoker followed for asthma complicated by hypertension and history of 3 lung nodules followed by Dr. Cyndia Bent. PCP Dr Darcus Austin Increased SOB since last visit; wheezing, was recently given abx and pred; still coughing-yellow in color but not as thick now; Would like to get Tdap shot today. Gets flu vaccine at school Peak flow running around 200. A good day is 300. Office spirometry 03/21/2012-FEV1 1.50/59%, FVC 2.67/84%, FEV1/FVC 0.56 FEF 25-75% 0.54. Moderate obstructive airways disease. CXR 09/06/11 IMPRESSION:  No active lung disease. Small lung nodules noted on CT of the  chest are not visible on chest x-ray. Thoracolumbar scoliosis.  Original Report Authenticated By: Joretta Bachelor, M.D.   04/24/12- 57 year old female former smoker followed for asthma complicated by hypertension and history of 3 lung nodules followed by Dr. Cyndia Bent. PCP Dr Darcus Austin Denies any problems with her breathing. Cough has cleared up. Occasional palpitations, jaw pain, tingling right arm. Asks cardiology evaluation. Easy dyspnea on exertion lifting. Dizzy spells. Family history of heart disease. Took Xanax before coming today. Works 7 days per week, much stress, tired. Aware of heart burn. Continues Advair 500, Spiriva. Finishing prednisone taper. 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 Allergy profile 03/22/2012- total IgE 255.3 with significant elevations especially for dust mites, animal danders, grass and tree pollens EKG 04/24/12-sinus rhythm, NSSTTW changes.  05/23/12- 57 year old female former smoker followed for asthma complicated by hypertension and history of 3 lung nodules followed by Dr. Cyndia Bent. PCP Dr Darcus Austin No antihistamines, OTC cough syrups, or sleep meds in past 2 days  Sputum 03/23/12- Neg [employer], Nl Flora Hoarseness and raspy cough come and go- not different this Fall. She stopped aspirin she was taking daily for joint pains, and thinks her breathing might have improved. Discussed inhalers. She asks about re-trying theophylline- discussed. Wants to lose weight.  No change with fall weather. Frequent cough and hoarseness, usually not productive. Allergy skin test 05/23/12- strong puncture reactions for ragweed, tree pollens, dust mite, cat, dog, coarse. Also to beef pork and lamb. We discussed these reactions and I made distinction between environmental respiratory allergies and foods. She understands to avoid foods which cause symptoms but has not been aware of problems with any meats. PFT: 04/30/2012-moderate obstructive airways disease with response to bronchodilator, air trapping, diffusion mildly reduced. FEV1 1.92/81%, FEV1/FVC 0.59, RV 134%, DLCO 74%  07/05/12- 57 year old female former smoker followed for asthma complicated by hypertension and history of 3 lung nodules followed by Dr. Cyndia Bent. PCP Dr Darcus Austin FOLLOWS FOR: doing well with Dymista and Theophylline; has a tickle clearing throat alot right now. There've been a lot of colds going around her office. She continues theophylline which we discussed again. Alpha-galI IgE was normal, making allergy to mammalian meat unlikely  She asks about trying allergy vaccine again based on the new skin tests. CXR 09/06/11 IMPRESSION:  No active  lung  disease. Small lung nodules noted on CT of the  chest are not visible on chest x-ray. Thoracolumbar scoliosis.  Original Report Authenticated By: Juline Patch, M.D.   ROS- see HPI Constitutional:   No-   weight loss, night sweats, fevers, chills, fatigue, lassitude. HEENT:   No-  headaches, difficulty swallowing, tooth/dental problems, sore throat,     +sneezing, no- itching, ear ache, nasal congestion, post nasal drip,  CV:  +chest pain, orthopnea, PND, swelling in lower extremities, anasarca, dizziness, +palpitations Resp: +  shortness of breath with exertion or at rest.             No-  productive cough,  + non-productive cough,  No-  coughing up of blood.              No-   change in color of mucus.  + Occasional wheezing.   Skin: No-   rash or lesions. GI:  +   heartburn, indigestion, no-abdominal pain, no-nausea, vomiting,  GU:  MS:  No-   joint pain or swelling.   Neuro- nothing unusual Psych:  No- change in mood or affect. + depression or anxiety.  No memory loss.  OBJ General- Alert, Oriented, Affect-appropriate, Distress- none acute Skin- rash-none, lesions- none, excoriation- none Lymphadenopathy- none Head- atraumatic            Eyes- Gross vision intact, PERRLA, conjunctivae clear secretions            Ears- Hearing, canals            Nose- Clear, Septal dev, mucus, polyps, erosion, perforation             Throat- Mallampati II , mucosa clear , drainage- none, tonsils- atrophic.  Neck- flexible , trachea midline, no stridor , thyroid nl, carotid no bruit Chest - symmetrical excursion , unlabored           Heart/CV- RRR , no murmur , no gallop  , no rub, nl s1 s2                           - JVD- none , edema- none, stasis changes- none, varices- none           Lung- clear to P&A, wheeze- none,  + deep breaths triggers dry cough , dullness-none, rub- none           Chest wall-  Abd-  Br/ Gen/ Rectal- Not done, not indicated Extrem- cyanosis- none, clubbing, none,  atrophy- none, strength- nl Neuro- grossly intact to observation

## 2012-07-05 NOTE — Patient Instructions (Addendum)
Add sample tudorza inhaler- 1 puff twice daily  Script for Dymista nasal spray  1-2 puffs each nostril, once daily at bedtime  In case Dymista is too expensive: Script for Flonase   1-2 puffs each nostril once daily at bedtime AND Script for Astelin     1-2 puffs each nostril once daily at bedtime  During bad seasons, these nasal sprays can all be used twice daily if needed  We can decide in the Spring whether we want to retry allergy shots

## 2012-07-18 NOTE — Assessment & Plan Note (Signed)
We know she is atopic and the trial of allergy vaccine would be very reasonable. She did not want to commit so we are scheduling return in a year with opportunity earlier as needed

## 2012-07-18 NOTE — Assessment & Plan Note (Signed)
Plan-add sample Tudorza inhaler. Watch impact the spring pollen season.

## 2012-08-16 ENCOUNTER — Telehealth: Payer: Self-pay | Admitting: Internal Medicine

## 2012-08-16 MED ORDER — FLUTICASONE-SALMETEROL 250-50 MCG/DOSE IN AEPB: INHALATION_SPRAY | RESPIRATORY_TRACT | Status: DC

## 2012-08-16 MED ORDER — ALBUTEROL SULFATE HFA 108 (90 BASE) MCG/ACT IN AERS: 2.0000 | INHALATION_SPRAY | RESPIRATORY_TRACT | Status: DC | PRN

## 2012-08-16 NOTE — Telephone Encounter (Signed)
Rx has been sent in, pt is aware. 

## 2012-08-28 ENCOUNTER — Telehealth: Payer: Self-pay | Admitting: Internal Medicine

## 2012-08-28 NOTE — Telephone Encounter (Signed)
Will forward back to triage to be handled tomorrow Coverage review # [phone] No ID available

## 2012-08-29 NOTE — Telephone Encounter (Signed)
Prior Berkley Harvey has been initiated through [email or web address]. I will forward message to Florentina Addison to look out for approval/denial. Carron Curie, CMA

## 2012-09-04 ENCOUNTER — Ambulatory Visit: Payer: 59 | Admitting: Internal Medicine

## 2012-09-04 ENCOUNTER — Encounter: Payer: Self-pay | Admitting: Internal Medicine

## 2012-09-04 ENCOUNTER — Other Ambulatory Visit: Payer: 59

## 2012-09-04 ENCOUNTER — Ambulatory Visit (INDEPENDENT_AMBULATORY_CARE_PROVIDER_SITE_OTHER): Payer: 59 | Admitting: Internal Medicine

## 2012-09-04 ENCOUNTER — Ambulatory Visit (INDEPENDENT_AMBULATORY_CARE_PROVIDER_SITE_OTHER)
Admission: RE | Admit: 2012-09-04 | Discharge: 2012-09-04 | Disposition: A | Discharge status: Home / Self Care | Payer: 59 | Source: Ambulatory Visit | Attending: Internal Medicine | Admitting: Internal Medicine

## 2012-09-04 VITALS — BP 110/56 | HR 88 | Ht 62.25 in | Wt 157.4 lb

## 2012-09-04 DIAGNOSIS — J42 Unspecified chronic bronchitis: Secondary | ICD-10-CM

## 2012-09-04 DIAGNOSIS — J309 Allergic rhinitis, unspecified: Secondary | ICD-10-CM

## 2012-09-04 DIAGNOSIS — J3089 Other allergic rhinitis: Secondary | ICD-10-CM

## 2012-09-04 DIAGNOSIS — J449 Chronic obstructive pulmonary disease, unspecified: Secondary | ICD-10-CM

## 2012-09-04 DIAGNOSIS — J302 Other seasonal allergic rhinitis: Secondary | ICD-10-CM

## 2012-09-04 MED ORDER — CLARITHROMYCIN 500 MG PO TABS: 500.0000 mg | ORAL_TABLET | Freq: Two times a day (BID) | ORAL | Status: DC

## 2012-09-04 MED ORDER — PREDNISONE (PAK) 10 MG PO TABS: ORAL_TABLET | ORAL | Status: DC

## 2012-09-04 MED ORDER — AZELASTINE HCL 0.1 % NA SOLN: NASAL | Status: DC

## 2012-09-04 MED ORDER — MOMETASONE FURO-FORMOTEROL FUM 100-5 MCG/ACT IN AERO: 2.0000 | INHALATION_SPRAY | Freq: Two times a day (BID) | RESPIRATORY_TRACT | Status: DC

## 2012-09-04 MED ORDER — FLUTICASONE PROPIONATE 50 MCG/ACT NA SUSP: 2.0000 | Freq: Every day | NASAL | Status: DC

## 2012-09-04 NOTE — Patient Instructions (Addendum)
Order- lab IgE   Dx chronic asthma with bronchitis  Sample and script Dulera 100   2 puffs then rinse mouth twice daily. This replaces Advair  Order CXR   Dx asthma with bronchits  Scripts to refill fluticasone and astelin  Script prednisone taper  Script Biaxin

## 2012-09-04 NOTE — Progress Notes (Signed)
03/30/11-57 year old female former smoker followed for asthma complicated by hypertension and history of 3 lung nodules followed by Dr. Cyndia Bent. PCP Dr Darcus Austin PFT 01/13/2003-FEV1 2.5 L/92%, FEV1/FVC 0.76 CT chest 08/11/2010 stable bilateral nodules without change since 2009. She reports doing well. Occasional wheeze is normal for her. She uses Advair once daily. This weekend noted increased cough, sneeze, wheeze. Peak flow dropped to 100. She started herself on prednisone 15 mg. 3 weeks ago she had been treated with antibiotics for diverticulitis(Dr. Collene Mares).  03/21/12- 57 year old female former smoker followed for asthma complicated by hypertension and history of 3 lung nodules followed by Dr. Cyndia Bent. PCP Dr Darcus Austin Increased SOB since last visit; wheezing, was recently given abx and pred; still coughing-yellow in color but not as thick now; Would like to get Tdap shot today. Gets flu vaccine at school Peak flow running around 200. A good day is 300. Office spirometry 03/21/2012-FEV1 1.50/59%, FVC 2.67/84%, FEV1/FVC 0.56 FEF 25-75% 0.54. Moderate obstructive airways disease. CXR 09/06/11 IMPRESSION:  No active lung disease. Small lung nodules noted on CT of the  chest are not visible on chest x-ray. Thoracolumbar scoliosis.  Original Report Authenticated By: Joretta Bachelor, M.D.   04/24/12- 57 year old female former smoker followed for asthma complicated by hypertension and history of 3 lung nodules followed by Dr. Cyndia Bent. PCP Dr Darcus Austin Denies any problems with her breathing. Cough has cleared up. Occasional palpitations, jaw pain, tingling right arm. Asks cardiology evaluation. Easy dyspnea on exertion lifting. Dizzy spells. Family history of heart disease. Took Xanax before coming today. Works 7 days per week, much stress, tired. Aware of heart burn. Continues Advair 500, Spiriva. Finishing prednisone taper. 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 Allergy profile 03/22/2012- total IgE 255.3 with significant elevations especially for dust mites, animal danders, grass and tree pollens EKG 04/24/12-sinus rhythm, NSSTTW changes.  05/23/12- 57 year old female former smoker followed for asthma complicated by hypertension and history of 3 lung nodules followed by Dr. Cyndia Bent. PCP Dr Darcus Austin No antihistamines, OTC cough syrups, or sleep meds in past 2 days  Sputum 03/23/12- Neg AF[employer], Nl [place] Hoarseness and raspy cough come and go- not different this Fall. She stopped aspirin she was taking daily for joint pains, and thinks her breathing might have improved. Discussed inhalers. She asks about re-trying theophylline- discussed. Wants to lose weight.  No change with fall weather. Frequent cough and hoarseness, usually not productive. Allergy skin test 05/23/12- strong puncture reactions for ragweed, tree pollens, dust mite, cat, dog, coarse. Also to beef pork and lamb. We discussed these reactions and I made distinction between environmental respiratory allergies and foods. She understands to avoid foods which cause symptoms but has not been aware of problems with any meats. PFT: 04/30/2012-moderate obstructive airways disease with response to bronchodilator, air trapping, diffusion mildly reduced. FEV1 1.92/81%, FEV1/FVC 0.59, RV 134%, DLCO 74%  07/05/12- 57 year old female former smoker followed for asthma complicated by hypertension and history of 3 lung nodules followed by Dr. Cyndia Bent. PCP Dr Darcus Austin FOLLOWS FOR: doing well with Dymista and Theophylline; has a tickle clearing throat alot right now. There've been a lot of colds going around her office. She continues theophylline which we discussed again. Alpha-galI IgE was normal, making allergy to mammalian meat unlikely  She asks about trying allergy vaccine again based on the new skin tests. CXR 09/06/11 IMPRESSION:  No active  lung  disease. Small lung nodules noted on CT of the  chest are not visible on chest x-ray. Thoracolumbar scoliosis.  Original Report Authenticated By: Juline Patch, M.D.   09/04/12- 57 year old female former smoker followed for asthma complicated by hypertension and history of 3 lung nodules followed by Dr. Laneta Simmers. PCP Dr Shaune Pollack FOLLOWS FOR: still having cough; insurance will not cover Advair -need new medication.  Coughing scant yellow, some shortness of breath, some frontal headache. Interested in Xolair. PFTs did show significant reactive airways and she is allergy skin test positive. We will check IgE level.  ROS- see HPI Constitutional:   No-   weight loss, night sweats, fevers, chills, fatigue, lassitude. HEENT:   + headaches, no-difficulty swallowing, tooth/dental problems, sore throat,     +sneezing, no- itching, ear ache, nasal congestion, post nasal drip,  CV:  +chest pain, orthopnea, PND, swelling in lower extremities, anasarca, dizziness, +palpitations Resp: +  shortness of breath with exertion or at rest.             +  productive cough,  + non-productive cough,  No-  coughing up of blood.              +  change in color of mucus.  + Occasional wheezing.   Skin: No-   rash or lesions. GI:  +   heartburn, indigestion, no-abdominal pain, no-nausea, vomiting,  GU:  MS:  No-   joint pain or swelling.   Neuro- nothing unusual Psych:  No- change in mood or affect. + depression or anxiety.  No memory loss.  OBJ General- Alert, Oriented, Affect-appropriate, Distress- none acute Skin- rash-none, lesions- none, excoriation- none Lymphadenopathy- none Head- atraumatic            Eyes- Gross vision intact, PERRLA, conjunctivae clear secretions            Ears- Hearing, canals            Nose- Clear, Septal dev, mucus, polyps, erosion, perforation             Throat- Mallampati II , mucosa clear , drainage+white postnasal drip, tonsils- atrophic.  Neck- flexible , trachea midline, no  stridor , thyroid nl, carotid no bruit Chest - symmetrical excursion , unlabored           Heart/CV- RRR , no murmur , no gallop  , no rub, nl s1 s2                           - JVD- none , edema- none, stasis changes- none, varices- none           Lung- ,  + deep breaths triggers dry, wheezy cough , dullness-none, rub- none           Chest wall-  Abd-  Br/ Gen/ Rectal- Not done, not indicated Extrem- cyanosis- none, clubbing, none, atrophy- none, strength- nl Neuro- grossly intact to observation

## 2012-09-05 LAB — IGE: IgE (Immunoglobulin E), Serum: 217.4 IU/mL — ABNORMAL HIGH (ref 0.0–180.0)

## 2012-09-05 NOTE — Progress Notes (Signed)
Quick Note:  LMTCB ______ 

## 2012-09-06 ENCOUNTER — Telehealth: Payer: Self-pay | Admitting: Internal Medicine

## 2012-09-06 NOTE — Telephone Encounter (Signed)
[name], have you received anything on pt. Please advise thanks

## 2012-09-06 NOTE — Progress Notes (Signed)
Quick Note:  Pt aware per 3.20.14 phone note and will discuss w/ CY at next appt. ______

## 2012-09-06 NOTE — Progress Notes (Signed)
Quick Note:  Pt aware per 3.20.14 phone note. ______

## 2012-09-06 NOTE — Telephone Encounter (Signed)
Called spoke with patient, advised of 3.18.14 cxr and lab results / recs as stated by CY.  Pt verbalized her understanding and denied any questions.  Will wait to discuss with CY at next ov about Xolair.  Nothing further needed; will sign off.

## 2012-09-06 NOTE — Telephone Encounter (Signed)
Returning call can be reached at [phone].Kelsey Duran

## 2012-09-06 NOTE — Telephone Encounter (Signed)
Notes Recorded by Waymon Budge, MD on 09/04/2012 at 8:13 PM CXR- obstructive airways pattern, consistent with PFTs, but no acute or active process.      LMTCB

## 2012-09-06 NOTE — Telephone Encounter (Signed)
PT returned call & will c/b in a little while. Antionette Fairy

## 2012-09-08 NOTE — Assessment & Plan Note (Signed)
Refill fluticasone and Astelin nasal sprays

## 2012-09-08 NOTE — Assessment & Plan Note (Addendum)
She may be a Xolair candidate, she is interested after discussion and I have given printed information. Plan- CXR, IgE change Advair to [facility] for insurance          For recent bronchitic exacerbation give prednisone taper and Biaxin

## 2012-09-10 NOTE — Telephone Encounter (Signed)
I spoke with pt. She is currently on dulera as of 09/04/12 OV. This replaces the advair. Nothing further was needed

## 2012-09-10 NOTE — Telephone Encounter (Signed)
Called to check on PA status and according to rep PA was never received through covermymeds so I did PA over the phone. Advair was denied because the pt has to try and fail 2 formulary medications: Symbicort and Dulera. Please advise if one of these meds are an appropriate alternative to Advair 250-50. Carron Curie, CMA Allergies  Allergen Reactions  . Penicillins   . Tetracycline

## 2012-09-10 NOTE — Telephone Encounter (Signed)
Try replacing Advair with Symbicort 160   #1, ref PRN, 2 puffs then rinse mouth, twice daily

## 2012-09-14 ENCOUNTER — Telehealth: Payer: Self-pay | Admitting: Internal Medicine

## 2012-09-14 MED ORDER — MOMETASONE FURO-FORMOTEROL FUM 100-5 MCG/ACT IN AERO: 2.0000 | INHALATION_SPRAY | Freq: Two times a day (BID) | RESPIRATORY_TRACT | Status: DC

## 2012-09-14 NOTE — Telephone Encounter (Signed)
Called, spoke with pt. Pt states dulera seems to be working really well for her. States she has the rx given at OV, but [facility] are "really slow" with mailing rxs out. Therefore, to save time, she would like [place] to send [facility] rx in rather than her mail in rx. This has been done. Pt aware and voiced no further questions or concerns at this time.

## 2012-10-04 ENCOUNTER — Telehealth: Payer: Self-pay | Admitting: Internal Medicine

## 2012-10-04 MED ORDER — FLUTICASONE PROPIONATE 50 MCG/ACT NA SUSP: 2.0000 | Freq: Every day | NASAL | Status: DC

## 2012-10-04 MED ORDER — AZELASTINE HCL 0.1 % NA SOLN: NASAL | Status: DC

## 2012-10-04 NOTE — Telephone Encounter (Signed)
Pt aware of correction.

## 2012-10-04 NOTE — Telephone Encounter (Signed)
Last ov w/ CY 3.18.14; refills sent at that ov Yes, the fluticasone and astelin were sent to [facility] for 30-day supply This has been corrected > sent for 90-day supply LMOM TCB x1 to apologize to pt for the inconvenience and inform her that the rx's have been resent

## 2012-10-10 ENCOUNTER — Ambulatory Visit (INDEPENDENT_AMBULATORY_CARE_PROVIDER_SITE_OTHER): Payer: 59 | Admitting: Internal Medicine

## 2012-10-10 ENCOUNTER — Encounter: Payer: Self-pay | Admitting: Internal Medicine

## 2012-10-10 VITALS — BP 124/76 | HR 91 | Ht 62.25 in | Wt 159.8 lb

## 2012-10-10 DIAGNOSIS — J45909 Unspecified asthma, uncomplicated: Secondary | ICD-10-CM

## 2012-10-10 DIAGNOSIS — J449 Chronic obstructive pulmonary disease, unspecified: Secondary | ICD-10-CM

## 2012-10-10 NOTE — Progress Notes (Signed)
03/30/11-57 year old female former smoker followed for asthma complicated by hypertension and history of 3 lung nodules followed by Dr. Cyndia Bent. PCP Dr Darcus Austin PFT 01/13/2003-FEV1 2.5 L/92%, FEV1/FVC 0.76 CT chest 08/11/2010 stable bilateral nodules without change since 2009. She reports doing well. Occasional wheeze is normal for her. She uses Advair once daily. This weekend noted increased cough, sneeze, wheeze. Peak flow dropped to 100. She started herself on prednisone 15 mg. 3 weeks ago she had been treated with antibiotics for diverticulitis(Dr. Collene Mares).  03/21/12- 57 year old female former smoker followed for asthma complicated by hypertension and history of 3 lung nodules followed by Dr. Cyndia Bent. PCP Dr Darcus Austin Increased SOB since last visit; wheezing, was recently given abx and pred; still coughing-yellow in color but not as thick now; Would like to get Tdap shot today. Gets flu vaccine at school Peak flow running around 200. A good day is 300. Office spirometry 03/21/2012-FEV1 1.50/59%, FVC 2.67/84%, FEV1/FVC 0.56 FEF 25-75% 0.54. Moderate obstructive airways disease. CXR 09/06/11 IMPRESSION:  No active lung disease. Small lung nodules noted on CT of the  chest are not visible on chest x-ray. Thoracolumbar scoliosis.  Original Report Authenticated By: Joretta Bachelor, M.D.   04/24/12- 57 year old female former smoker followed for asthma complicated by hypertension and history of 3 lung nodules followed by Dr. Cyndia Bent. PCP Dr Darcus Austin Denies any problems with her breathing. Cough has cleared up. Occasional palpitations, jaw pain, tingling right arm. Asks cardiology evaluation. Easy dyspnea on exertion lifting. Dizzy spells. Family history of heart disease. Took Xanax before coming today. Works 7 days per week, much stress, tired. Aware of heart burn. Continues Advair 500, Spiriva. Finishing prednisone taper. 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 Allergy profile 03/22/2012- total IgE 255.3 with significant elevations especially for dust mites, animal danders, grass and tree pollens EKG 04/24/12-sinus rhythm, NSSTTW changes.  05/23/12- 57 year old female former smoker followed for asthma complicated by hypertension and history of 3 lung nodules followed by Dr. Cyndia Bent. PCP Dr Darcus Austin No antihistamines, OTC cough syrups, or sleep meds in past 2 days  Sputum 03/23/12- Neg [employer], Nl [place] Hoarseness and raspy cough come and go- not different this Fall. She stopped aspirin she was taking daily for joint pains, and thinks her breathing might have improved. Discussed inhalers. She asks about re-trying theophylline- discussed. Wants to lose weight.  No change with fall weather. Frequent cough and hoarseness, usually not productive. Allergy skin test 05/23/12- strong puncture reactions for ragweed, tree pollens, dust mite, cat, dog, coarse. Also to beef pork and lamb. We discussed these reactions and I made distinction between environmental respiratory allergies and foods. She understands to avoid foods which cause symptoms but has not been aware of problems with any meats. PFT: 04/30/2012-moderate obstructive airways disease with response to bronchodilator, air trapping, diffusion mildly reduced. FEV1 1.92/81%, FEV1/FVC 0.59, RV 134%, DLCO 74%  07/05/12- 57 year old female former smoker followed for asthma complicated by hypertension and history of 3 lung nodules followed by Dr. Cyndia Bent. PCP Dr Darcus Austin FOLLOWS FOR: doing well with Dymista and Theophylline; has a tickle clearing throat alot right now. There've been a lot of colds going around her office. She continues theophylline which we discussed again. Alpha-galI IgE was normal, making allergy to mammalian meat unlikely  She asks about trying allergy vaccine again based on the new skin tests. CXR 09/06/11 IMPRESSION:  No active  lung  disease. Small lung nodules noted on CT of the  chest are not visible on chest x-ray. Thoracolumbar scoliosis.  Original Report Authenticated By: Juline Patch, M.D.   09/04/12- 57 year old female former smoker followed for asthma complicated by hypertension and history of 3 lung nodules followed by Dr. Laneta Simmers. PCP Dr Shaune Pollack FOLLOWS FOR: still having cough; insurance will not cover Advair -need new medication.  Coughing scant yellow, some shortness of breath, some frontal headache. Interested in Xolair. PFTs did show significant reactive airways and she is allergy skin test positive. We will check IgE level.  10/11/11- 57 year old female former smoker followed for asthma/ COPD complicated by hypertension and history of 3 lung nodules followed by Dr. Laneta Simmers. PCP Dr Shaune Pollack [facility] inhaler wasn't enough for control, also needed a prednisone taper. Finished prednisone yesterday and doing well today. We had a detailed discussion of Xolair including purpose, mechanism, risks, protocol. She is very interested. IgE 09/04/12- 217.4 CXR 09/05/12 IMPRESSION:  Hyperinflation configuration. No pulmonary edema, pneumonia, or  pleural effusion.  Original Report Authenticated By: Onalee Hua Call   ROS- see HPI Constitutional:   No-   weight loss, night sweats, fevers, chills, fatigue, lassitude. HEENT:   + headaches, no-difficulty swallowing, tooth/dental problems, sore throat,     +sneezing, no- itching, ear ache, nasal congestion, post nasal drip,  CV:  No-chest pain, orthopnea, PND, swelling in lower extremities, anasarca, dizziness, +palpitations Resp: +  shortness of breath with exertion or at rest.             +  productive cough,  + non-productive cough,  No-  coughing up of blood.              +  change in color of mucus.  + Occasional wheezing.   Skin: No-   rash or lesions. GI:  +   heartburn, indigestion, no-abdominal pain, no-nausea, vomiting,  GU:  MS:  No-   joint pain or swelling.    Neuro- nothing unusual Psych:  No- change in mood or affect. + depression or anxiety.  No memory loss.  OBJ General- Alert, Oriented, Affect-appropriate, Distress- none acute Skin- rash-none, lesions- none, excoriation- none Lymphadenopathy- none Head- atraumatic            Eyes- Gross vision intact, PERRLA, conjunctivae clear secretions            Ears- Hearing, canals            Nose- Clear, Septal dev, mucus, polyps, erosion, perforation             Throat- Mallampati II , mucosa clear , drainage +white postnasal drip, tonsils- atrophic.  Neck- flexible , trachea midline, no stridor , thyroid nl, carotid no bruit Chest - symmetrical excursion , unlabored           Heart/CV- RRR , no murmur , no gallop  , no rub, nl s1 s2                           - JVD- none , edema- none, stasis changes- none, varices- none           Lung-   clear , dullness-none, rub- none           Chest wall-  Abd-  Br/ Gen/ Rectal- Not done, not indicated Extrem- cyanosis- none, clubbing, none, atrophy- none, strength- nl Neuro- grossly intact to observation

## 2012-10-10 NOTE — Patient Instructions (Addendum)
Order-  [facility] Start Xolair application   Please call as needed

## 2012-10-17 NOTE — Assessment & Plan Note (Signed)
Better control at least temporarily after recent prednisone. She is interested in Xolair which we have discussed carefully.

## 2012-10-18 ENCOUNTER — Encounter: Payer: Self-pay | Admitting: Internal Medicine

## 2012-10-18 MED ORDER — OMALIZUMAB 150 MG ~~LOC~~ SOLR: 225.0000 mg | SUBCUTANEOUS | Status: DC

## 2012-10-18 NOTE — Addendum Note (Signed)
Addended by: Jetty Duhamel D on: 10/18/2012 08:40 AM   Modules accepted: Orders

## 2012-10-18 NOTE — Addendum Note (Signed)
Addended by: Jetty Duhamel D on: 10/18/2012 08:29 AM   Modules accepted: Orders

## 2012-10-18 NOTE — Addendum Note (Signed)
Addended by: Ronny Bacon on: 10/18/2012 03:54 PM   Modules accepted: Orders

## 2012-11-05 ENCOUNTER — Telehealth: Payer: Self-pay | Admitting: Internal Medicine

## 2012-11-05 MED ORDER — CEFDINIR 300 MG PO CAPS: 300.0000 mg | ORAL_CAPSULE | Freq: Two times a day (BID) | ORAL | Status: DC

## 2012-11-05 MED ORDER — PROMETHAZINE-CODEINE 6.25-10 MG/5ML PO SYRP: 5.0000 mL | ORAL_SOLUTION | Freq: Four times a day (QID) | ORAL | Status: DC | PRN

## 2012-11-05 NOTE — Telephone Encounter (Signed)
Per CY-Rx Cefdinir 300 mg #14 take 1 po BID no refills(cephalosporin-most people allergic to PCN can take this) and Rx Promethazine with codeine cough syrup #237ml 1 tsp every 6 hours prn cough no refills.

## 2012-11-05 NOTE — Telephone Encounter (Signed)
lmomtcb x1 for pt 

## 2012-11-05 NOTE — Telephone Encounter (Signed)
Last OV 10/10/12. Pt is c/o having chest congestion, nasal congestion, dry cough, sore throat x 1 week. Pt states she has coughed so much that her ribs and back are sore. Pt states she is using flonase and astelin as directed without relief. Xolair referral was placed and has been submitted to company and we are waiting on approval.  Please advise. Carron Curie, CMA Allergies  Allergen Reactions  . Biaxin (Clarithromycin)     GI UPSET  . Penicillins   . Tetracycline

## 2012-11-05 NOTE — Telephone Encounter (Signed)
Pt aware of recs and rx sent. [name], CMA  

## 2012-11-08 ENCOUNTER — Ambulatory Visit (INDEPENDENT_AMBULATORY_CARE_PROVIDER_SITE_OTHER): Payer: 59 | Admitting: Adult Health

## 2012-11-08 ENCOUNTER — Encounter: Payer: Self-pay | Admitting: Adult Health

## 2012-11-08 ENCOUNTER — Telehealth: Payer: Self-pay | Admitting: Internal Medicine

## 2012-11-08 VITALS — BP 114/66 | HR 74 | Temp 98.2°F | Ht 62.5 in | Wt 159.6 lb

## 2012-11-08 DIAGNOSIS — J449 Chronic obstructive pulmonary disease, unspecified: Secondary | ICD-10-CM

## 2012-11-08 MED ORDER — PREDNISONE 10 MG PO TABS: ORAL_TABLET | ORAL | Status: DC

## 2012-11-08 MED ORDER — PROMETHAZINE-CODEINE 6.25-10 MG/5ML PO SYRP: 5.0000 mL | ORAL_SOLUTION | Freq: Four times a day (QID) | ORAL | Status: DC | PRN

## 2012-11-08 MED ORDER — CEFDINIR 300 MG PO CAPS: 300.0000 mg | ORAL_CAPSULE | Freq: Two times a day (BID) | ORAL | Status: DC

## 2012-11-08 NOTE — Progress Notes (Signed)
03/30/11-57 year old female former smoker followed for asthma complicated by hypertension and history of 3 lung nodules followed by Dr. Cyndia Bent. PCP Dr Darcus Austin PFT 01/13/2003-FEV1 2.5 L/92%, FEV1/FVC 0.76 CT chest 08/11/2010 stable bilateral nodules without change since 2009. She reports doing well. Occasional wheeze is normal for her. She uses Advair once daily. This weekend noted increased cough, sneeze, wheeze. Peak flow dropped to 100. She started herself on prednisone 15 mg. 3 weeks ago she had been treated with antibiotics for diverticulitis(Dr. Collene Mares).  03/21/12- 57 year old female former smoker followed for asthma complicated by hypertension and history of 3 lung nodules followed by Dr. Cyndia Bent. PCP Dr Darcus Austin Increased SOB since last visit; wheezing, was recently given abx and pred; still coughing-yellow in color but not as thick now; Would like to get Tdap shot today. Gets flu vaccine at school Peak flow running around 200. A good day is 300. Office spirometry 03/21/2012-FEV1 1.50/59%, FVC 2.67/84%, FEV1/FVC 0.56 FEF 25-75% 0.54. Moderate obstructive airways disease. CXR 09/06/11 IMPRESSION:  No active lung disease. Small lung nodules noted on CT of the  chest are not visible on chest x-ray. Thoracolumbar scoliosis.  Original Report Authenticated By: Joretta Bachelor, M.D.   04/24/12- 57 year old female former smoker followed for asthma complicated by hypertension and history of 3 lung nodules followed by Dr. Cyndia Bent. PCP Dr Darcus Austin Denies any problems with her breathing. Cough has cleared up. Occasional palpitations, jaw pain, tingling right arm. Asks cardiology evaluation. Easy dyspnea on exertion lifting. Dizzy spells. Family history of heart disease. Took Xanax before coming today. Works 7 days per week, much stress, tired. Aware of heart burn. Continues Advair 500, Spiriva. Finishing prednisone taper. 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 Allergy profile 03/22/2012- total IgE 255.3 with significant elevations especially for dust mites, animal danders, grass and tree pollens EKG 04/24/12-sinus rhythm, NSSTTW changes.  05/23/12- 57 year old female former smoker followed for asthma complicated by hypertension and history of 3 lung nodules followed by Dr. Cyndia Bent. PCP Dr Darcus Austin No antihistamines, OTC cough syrups, or sleep meds in past 2 days  Sputum 03/23/12- Neg [employer], Nl [place] Hoarseness and raspy cough come and go- not different this Fall. She stopped aspirin she was taking daily for joint pains, and thinks her breathing might have improved. Discussed inhalers. She asks about re-trying theophylline- discussed. Wants to lose weight.  No change with fall weather. Frequent cough and hoarseness, usually not productive. Allergy skin test 05/23/12- strong puncture reactions for ragweed, tree pollens, dust mite, cat, dog, coarse. Also to beef pork and lamb. We discussed these reactions and I made distinction between environmental respiratory allergies and foods. She understands to avoid foods which cause symptoms but has not been aware of problems with any meats. PFT: 04/30/2012-moderate obstructive airways disease with response to bronchodilator, air trapping, diffusion mildly reduced. FEV1 1.92/81%, FEV1/FVC 0.59, RV 134%, DLCO 74%  07/05/12- 57 year old female former smoker followed for asthma complicated by hypertension and history of 3 lung nodules followed by Dr. Cyndia Bent. PCP Dr Darcus Austin FOLLOWS FOR: doing well with Dymista and Theophylline; has a tickle clearing throat alot right now. There've been a lot of colds going around her office. She continues theophylline which we discussed again. Alpha-galI IgE was normal, making allergy to mammalian meat unlikely  She asks about trying allergy vaccine again based on the new skin tests. CXR 09/06/11 IMPRESSION:  No active  lung  disease. Small lung nodules noted on CT of the  chest are not visible on chest x-ray. Thoracolumbar scoliosis.  Original Report Authenticated By: Juline Patch, M.D.   09/04/12- 57 year old female former smoker followed for asthma complicated by hypertension and history of 3 lung nodules followed by Dr. Laneta Simmers. PCP Dr Shaune Pollack FOLLOWS FOR: still having cough; insurance will not cover Advair -need new medication.  Coughing scant yellow, some shortness of breath, some frontal headache. Interested in Xolair. PFTs did show significant reactive airways and she is allergy skin test positive. We will check IgE level.  10/11/11- 57 year old female former smoker followed for asthma/ COPD complicated by hypertension and history of 3 lung nodules followed by Dr. Laneta Simmers. PCP Dr Shaune Pollack [facility] inhaler wasn't enough for control, also needed a prednisone taper. Finished prednisone yesterday and doing well today. We had a detailed discussion of Xolair including purpose, mechanism, risks, protocol. She is very interested. IgE 09/04/12- 217.4 CXR 09/05/12 IMPRESSION:  Hyperinflation configuration. No pulmonary edema, pneumonia, or  pleural effusion.  Original Report Authenticated By: Onalee Hua Call   11/08/2012 Acute OV  Complains of Severe cough, painful at times.  Has prod cough with light yellow mucus, hoarseness, some PND/sore throat, wheezing, chest tightness; currently taking omnicef 300mg  since 5.19.14.  hurt back this morning from coughing; in near-severe pain.  No hemoptysis , chest pain, edema, or orthopnea.  CXR last month with NAD.      ROS- see HPI Constitutional:   No-   weight loss, night sweats, fevers, chills, fatigue, lassitude. HEENT:    headaches, no-difficulty swallowing, tooth/dental problems, sore throat,     +sneezing, no- itching, ear ache, nasal congestion, post nasal drip,  CV:  No-chest pain, orthopnea, PND, swelling in lower extremities, anasarca, dizziness,  +palpitations Resp: +  shortness of breath with exertion or at rest.             +  productive cough,  + non-productive cough,  No-  coughing up of blood.              +  change in color of mucus.  + Occasional wheezing.   Skin: No-   rash or lesions. GI:  +   heartburn, indigestion, no-abdominal pain, no-nausea, vomiting,  GU:  MS:  No-   joint pain or swelling.   Neuro- nothing unusual Psych:  No- change in mood or affect. + depression or anxiety.  No memory loss.  OBJ General- Alert, Oriented, Affect-appropriate, Distress- none acute Skin- rash-none, lesions- none, excoriation- none Lymphadenopathy- none Head- atraumatic            Eyes- Gross vision intact, PERRLA, conjunctivae clear secretions            Ears- Hearing, canals            Nose- Clear, Septal dev, mucus, polyps, erosion, perforation             Throat- Mallampati II , mucosa clear , drainage +white postnasal drip, tonsils- atrophic.  Neck- flexible , trachea midline, no stridor , thyroid nl, carotid no bruit Chest - symmetrical excursion , unlabored           Heart/CV- RRR , no murmur , no gallop  , no rub, nl s1 s2                           - JVD- none , edema- none, stasis changes- none, varices-  none           Lung-   clear , dullness-none, rub- none no wheezing            Chest wall-  Abd-  Br/ Gen/ Rectal- Not done, not indicated Extrem- cyanosis- none, clubbing, none, atrophy- none, strength- nl Neuro- grossly intact to observation

## 2012-11-08 NOTE — Telephone Encounter (Signed)
I spoke with pt and she states she is having a lot of congestion and has hurt her back from all the coughing. I spoke with Florentina Addison and okat to schedule OV w/ TP. I have done so for today and nothing further was needed

## 2012-11-08 NOTE — Assessment & Plan Note (Signed)
Slow to resolve flare with bronchitis   Plan  Extend Omnicef  For an additonal 3 days  Mucinex DM Twice daily  As needed  Cough/congestion  Prednisone taper over next week.  Fluids and rest  Please contact office for sooner follow up if symptoms do not improve or worsen or seek emergency care  Follow up Dr. Maple Hudson  As planned and As needed

## 2012-11-08 NOTE — Patient Instructions (Addendum)
Extend Omnicef  For an additonal 3 days  Mucinex DM Twice daily  As needed  Cough/congestion  Prednisone taper over next week.  Fluids and rest  Please contact office for sooner follow up if symptoms do not improve or worsen or seek emergency care  Follow up Dr. Maple Hudson  As planned and As needed

## 2012-12-11 ENCOUNTER — Telehealth: Payer: Self-pay | Admitting: *Deleted

## 2012-12-11 NOTE — Telephone Encounter (Signed)
Spoke with Kelsey Duran and she states that [employer] Enrollment form was faxed on 12-03-12 for CY to sign.  Case# [id number] - [name], Have you seen this form?

## 2012-12-13 NOTE — Telephone Encounter (Signed)
I spoke with rep at [employer]-stated they never received the fax back for open enrollment; I got the one Kelsey Duran had completed in patients Xolair file; I have re-faxed this form and we will await a decision. I confirmed with [employer] that we did fax. Usually takes about 48 hours for a decision and they will fax that decision to [place].

## 2012-12-18 ENCOUNTER — Other Ambulatory Visit: Payer: Self-pay | Admitting: Internal Medicine

## 2013-01-09 ENCOUNTER — Telehealth: Payer: Self-pay | Admitting: Internal Medicine

## 2013-01-15 NOTE — Telephone Encounter (Signed)
[name] patient to see if she plans on contacting [facility] to start Xolair, no answer [id number]

## 2013-01-16 NOTE — Telephone Encounter (Signed)
Called, spoke with pt.  Reports she hasn't received any recent calls from [facility]. I have provided her with their phone #. She is aware they will need to speak with her before they can ship medication. She verbalized understanding and will contact [facility]. Will send msg to [facility] as FYI. Can we close this msg or do you need it for additional f/u.

## 2013-01-21 NOTE — Telephone Encounter (Signed)
Please advise if msg can be closed thanks!

## 2013-02-14 ENCOUNTER — Telehealth: Payer: Self-pay | Admitting: Internal Medicine

## 2013-02-14 MED ORDER — EPINEPHRINE 0.3 MG/0.3ML IJ SOAJ: 0.3000 mg | Freq: Once | INTRAMUSCULAR | Status: DC

## 2013-02-14 NOTE — Telephone Encounter (Signed)
Pt understands that 2 hour wait for new start and epi pen rx has been sent to [facility] [facility] and [place]. Pt will receive her medication through [facility] and aware I will call her once medication is here in the office to schedule her 1 st visit.

## 2013-02-14 NOTE — Addendum Note (Signed)
Addended by: Reynaldo Minium C on: 02/14/2013 11:48 AM   Modules accepted: Orders

## 2013-02-21 ENCOUNTER — Telehealth: Payer: Self-pay | Admitting: Internal Medicine

## 2013-02-21 NOTE — Telephone Encounter (Signed)
Speak with [name] ONLY!!

## 2013-02-21 NOTE — Telephone Encounter (Signed)
Spoke with patient-aware that Xolair is here and ready to start. Appointment was set for Monday 02-25-13 at 9am. Pt is aware to check in at front desk and sign in at allergy. Pt will bring Epi pen with her to this appointment and will show her how to use(again) prior to injections. Pt also aware of 2 hour wait time after injections. Paperwork given to [employer] in allergy for this. All other paperwork sent to scan in [facility].

## 2013-02-25 ENCOUNTER — Ambulatory Visit (INDEPENDENT_AMBULATORY_CARE_PROVIDER_SITE_OTHER): Payer: 59

## 2013-02-25 DIAGNOSIS — J45909 Unspecified asthma, uncomplicated: Secondary | ICD-10-CM

## 2013-02-27 MED ORDER — OMALIZUMAB 150 MG ~~LOC~~ SOLR
225.0000 mg | Freq: Once | SUBCUTANEOUS | Status: AC
  Administered 2013-02-27: 225 mg via SUBCUTANEOUS

## 2013-03-11 ENCOUNTER — Ambulatory Visit: Payer: 59

## 2013-03-12 ENCOUNTER — Ambulatory Visit (INDEPENDENT_AMBULATORY_CARE_PROVIDER_SITE_OTHER): Payer: 59

## 2013-03-12 DIAGNOSIS — J449 Chronic obstructive pulmonary disease, unspecified: Secondary | ICD-10-CM

## 2013-03-12 MED ORDER — OMALIZUMAB 150 MG ~~LOC~~ SOLR
225.0000 mg | Freq: Once | SUBCUTANEOUS | Status: AC
  Administered 2013-03-12: 225 mg via SUBCUTANEOUS

## 2013-03-26 ENCOUNTER — Ambulatory Visit (INDEPENDENT_AMBULATORY_CARE_PROVIDER_SITE_OTHER): Payer: 59

## 2013-03-26 DIAGNOSIS — J449 Chronic obstructive pulmonary disease, unspecified: Secondary | ICD-10-CM

## 2013-03-26 DIAGNOSIS — J4489 Other specified chronic obstructive pulmonary disease: Secondary | ICD-10-CM

## 2013-03-27 MED ORDER — OMALIZUMAB 150 MG ~~LOC~~ SOLR
225.0000 mg | Freq: Once | SUBCUTANEOUS | Status: AC
  Administered 2013-03-27: 225 mg via SUBCUTANEOUS

## 2013-04-09 ENCOUNTER — Ambulatory Visit (INDEPENDENT_AMBULATORY_CARE_PROVIDER_SITE_OTHER): Payer: 59

## 2013-04-09 DIAGNOSIS — J449 Chronic obstructive pulmonary disease, unspecified: Secondary | ICD-10-CM

## 2013-04-10 MED ORDER — OMALIZUMAB 150 MG ~~LOC~~ SOLR
225.0000 mg | Freq: Once | SUBCUTANEOUS | Status: AC
  Administered 2013-04-10: 225 mg via SUBCUTANEOUS

## 2013-04-11 ENCOUNTER — Ambulatory Visit (INDEPENDENT_AMBULATORY_CARE_PROVIDER_SITE_OTHER): Payer: 59 | Admitting: Internal Medicine

## 2013-04-11 ENCOUNTER — Encounter: Payer: Self-pay | Admitting: Internal Medicine

## 2013-04-11 VITALS — BP 108/74 | HR 71 | Ht 62.5 in | Wt 148.2 lb

## 2013-04-11 DIAGNOSIS — J449 Chronic obstructive pulmonary disease, unspecified: Secondary | ICD-10-CM

## 2013-04-11 DIAGNOSIS — J309 Allergic rhinitis, unspecified: Secondary | ICD-10-CM

## 2013-04-11 DIAGNOSIS — J302 Other seasonal allergic rhinitis: Secondary | ICD-10-CM

## 2013-04-11 DIAGNOSIS — Z23 Encounter for immunization: Secondary | ICD-10-CM

## 2013-04-11 NOTE — Progress Notes (Signed)
03/30/11-57 year old female former smoker followed for asthma complicated by hypertension and history of 3 lung nodules followed by Dr. Cyndia Bent. PCP Dr Darcus Austin PFT 01/13/2003-FEV1 2.5 L/92%, FEV1/FVC 0.76 CT chest 08/11/2010 stable bilateral nodules without change since 2009. She reports doing well. Occasional wheeze is normal for her. She uses Advair once daily. This weekend noted increased cough, sneeze, wheeze. Peak flow dropped to 100. She started herself on prednisone 15 mg. 3 weeks ago she had been treated with antibiotics for diverticulitis(Dr. Collene Mares).  03/21/12- 57 year old female former smoker followed for asthma complicated by hypertension and history of 3 lung nodules followed by Dr. Cyndia Bent. PCP Dr Darcus Austin Increased SOB since last visit; wheezing, was recently given abx and pred; still coughing-yellow in color but not as thick now; Would like to get Tdap shot today. Gets flu vaccine at school Peak flow running around 200. A good day is 300. Office spirometry 03/21/2012-FEV1 1.50/59%, FVC 2.67/84%, FEV1/FVC 0.56 FEF 25-75% 0.54. Moderate obstructive airways disease. CXR 09/06/11 IMPRESSION:  No active lung disease. Small lung nodules noted on CT of the  chest are not visible on chest x-ray. Thoracolumbar scoliosis.  Original Report Authenticated By: Joretta Bachelor, M.D.   04/24/12- 57 year old female former smoker followed for asthma complicated by hypertension and history of 3 lung nodules followed by Dr. Cyndia Bent. PCP Dr Darcus Austin Denies any problems with her breathing. Cough has cleared up. Occasional palpitations, jaw pain, tingling right arm. Asks cardiology evaluation. Easy dyspnea on exertion lifting. Dizzy spells. Family history of heart disease. Took Xanax before coming today. Works 7 days per week, much stress, tired. Aware of heart burn. Continues Advair 500, Spiriva. Finishing prednisone taper. 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 Allergy profile 03/22/2012- total IgE 255.3 with significant elevations especially for dust mites, animal danders, grass and tree pollens EKG 04/24/12-sinus rhythm, NSSTTW changes.  05/23/12- 57 year old female former smoker followed for asthma complicated by hypertension and history of 3 lung nodules followed by Dr. Cyndia Bent. PCP Dr Darcus Austin No antihistamines, OTC cough syrups, or sleep meds in past 2 days  Sputum 03/23/12- Neg [employer], Nl [place] Hoarseness and raspy cough come and go- not different this Fall. She stopped aspirin she was taking daily for joint pains, and thinks her breathing might have improved. Discussed inhalers. She asks about re-trying theophylline- discussed. Wants to lose weight.  No change with fall weather. Frequent cough and hoarseness, usually not productive. Allergy skin test 05/23/12- strong puncture reactions for ragweed, tree pollens, dust mite, cat, dog, coarse. Also to beef pork and lamb. We discussed these reactions and I made distinction between environmental respiratory allergies and foods. She understands to avoid foods which cause symptoms but has not been aware of problems with any meats. PFT: 04/30/2012-moderate obstructive airways disease with response to bronchodilator, air trapping, diffusion mildly reduced. FEV1 1.92/81%, FEV1/FVC 0.59, RV 134%, DLCO 74%  07/05/12- 57 year old female former smoker followed for asthma complicated by hypertension and history of 3 lung nodules followed by Dr. Cyndia Bent. PCP Dr Darcus Austin FOLLOWS FOR: doing well with Dymista and Theophylline; has a tickle clearing throat alot right now. There've been a lot of colds going around her office. She continues theophylline which we discussed again. Alpha-galI IgE was normal, making allergy to mammalian meat unlikely  She asks about trying allergy vaccine again based on the new skin tests. CXR 09/06/11 IMPRESSION:  No active  lung  disease. Small lung nodules noted on CT of the  chest are not visible on chest x-ray. Thoracolumbar scoliosis.  Original Report Authenticated By: Juline Patch, M.D.   09/04/12- 57 year old female former smoker followed for asthma complicated by hypertension and history of 3 lung nodules followed by Dr. Laneta Simmers. PCP Dr Shaune Pollack FOLLOWS FOR: still having cough; insurance will not cover Advair -need new medication.  Coughing scant yellow, some shortness of breath, some frontal headache. Interested in Xolair. PFTs did show significant reactive airways and she is allergy skin test positive. We will check IgE level.  10/11/11- 57 year old female former smoker followed for asthma/ COPD complicated by hypertension and history of 3 lung nodules followed by Dr. Laneta Simmers. PCP Dr Shaune Pollack [facility] inhaler wasn't enough for control, also needed a prednisone taper. Finished prednisone yesterday and doing well today. We had a detailed discussion of Xolair including purpose, mechanism, risks, protocol. She is very interested. IgE 09/04/12- 217.4 CXR 09/05/12 IMPRESSION:  Hyperinflation configuration. No pulmonary edema, pneumonia, or  pleural effusion.  Original Report Authenticated By: Onalee Hua Call   11/08/2012 Acute OV  Complains of Severe cough, painful at times.  Has prod cough with light yellow mucus, hoarseness, some PND/sore throat, wheezing, chest tightness; currently taking omnicef 300mg  since 5.19.14.  hurt back this morning from coughing; in near-severe pain.  No hemoptysis , chest pain, edema, or orthopnea.  CXR last month with NAD.    04/11/13-  57 year old female former smoker followed for asthma/ COPD complicated by hypertension and history of 3 lung nodules followed by Dr. Laneta Simmers. PCP Dr Shaune Pollack FOLLOWS FOR: cough-productive-yelllow(light) in color. Continues to do well with Xolair. Xolair 225/ 2 wks x 1 month so far Avoiding dairy and wheat products Daily cough productive white/yellow  thick mucus, better at the beach.  ROS- see HPI Constitutional:   No-   weight loss, night sweats, fevers, chills, fatigue, lassitude. HEENT:    headaches, no-difficulty swallowing, tooth/dental problems, sore throat,     No-sneezing, no- itching, ear ache, nasal congestion, post nasal drip,  CV:  No-chest pain, orthopnea, PND, swelling in lower extremities, anasarca, dizziness, +palpitations Resp: +  shortness of breath with exertion or at rest.             +  productive cough,  + non-productive cough,  No-  coughing up of blood.              +  change in color of mucus.  + Occasional wheezing.   Skin: No-   rash or lesions. GI:  No-  heartburn, indigestion, no-abdominal pain, no-nausea, vomiting,  GU:  MS:  No-   joint pain or swelling.   Neuro- nothing unusual Psych:  No- change in mood or affect. + depression or anxiety.  No memory loss.  OBJ General- Alert, Oriented, Affect-appropriate, Distress- none acute Skin- rash-none, lesions- none, excoriation- none Lymphadenopathy- none Head- atraumatic            Eyes- Gross vision intact, PERRLA, conjunctivae clear secretions            Ears- Hearing, canals            Nose- Clear, Septal dev, mucus, polyps, erosion, perforation             Throat- Mallampati II , mucosa clear , drainage +white postnasal drip, tonsils- atrophic.  Neck- flexible , trachea midline, no stridor , thyroid nl, carotid no bruit Chest - symmetrical excursion , unlabored  Heart/CV- RRR , no murmur , no gallop  , no rub, nl s1 s2                           - JVD- none , edema- none, stasis changes- none, varices- none           Lung-   clear , dullness-none, rub- none no wheezing            Chest wall-  Abd-  Br/ Gen/ Rectal- Not done, not indicated Extrem- cyanosis- none, clubbing, none, atrophy- none, strength- nl Neuro- grossly intact to observation

## 2013-04-11 NOTE — Patient Instructions (Signed)
We can continue Xolair  Flu vax  Please call as needed

## 2013-04-23 ENCOUNTER — Ambulatory Visit (INDEPENDENT_AMBULATORY_CARE_PROVIDER_SITE_OTHER): Payer: 59

## 2013-04-23 DIAGNOSIS — J45909 Unspecified asthma, uncomplicated: Secondary | ICD-10-CM

## 2013-04-24 MED ORDER — OMALIZUMAB 150 MG ~~LOC~~ SOLR
225.0000 mg | Freq: Once | SUBCUTANEOUS | Status: AC
  Administered 2013-04-24: 225 mg via SUBCUTANEOUS

## 2013-04-27 NOTE — Assessment & Plan Note (Signed)
Hopefully Xolair would also benefit an allergic rhinitis

## 2013-04-27 NOTE — Assessment & Plan Note (Signed)
It's too early to be sure that she is optimistic about Xolair. Plan-flu shot

## 2013-04-29 ENCOUNTER — Telehealth: Payer: Self-pay | Admitting: Internal Medicine

## 2013-04-29 MED ORDER — ALBUTEROL SULFATE HFA 108 (90 BASE) MCG/ACT IN AERS: 2.0000 | INHALATION_SPRAY | Freq: Four times a day (QID) | RESPIRATORY_TRACT | Status: DC | PRN

## 2013-04-29 NOTE — Telephone Encounter (Signed)
Rx has been sent in. Pt is aware. 

## 2013-05-01 ENCOUNTER — Telehealth: Payer: Self-pay | Admitting: Internal Medicine

## 2013-05-01 MED ORDER — ALBUTEROL SULFATE HFA 108 (90 BASE) MCG/ACT IN AERS: 2.0000 | INHALATION_SPRAY | Freq: Four times a day (QID) | RESPIRATORY_TRACT | Status: DC | PRN

## 2013-05-01 NOTE — Telephone Encounter (Signed)
Refill sent pt is aware. [name], CMA  

## 2013-05-07 ENCOUNTER — Ambulatory Visit (INDEPENDENT_AMBULATORY_CARE_PROVIDER_SITE_OTHER): Payer: 59

## 2013-05-07 DIAGNOSIS — J45909 Unspecified asthma, uncomplicated: Secondary | ICD-10-CM

## 2013-05-08 MED ORDER — OMALIZUMAB 150 MG ~~LOC~~ SOLR
225.0000 mg | Freq: Once | SUBCUTANEOUS | Status: AC
  Administered 2013-05-08: 225 mg via SUBCUTANEOUS

## 2013-05-21 ENCOUNTER — Ambulatory Visit (INDEPENDENT_AMBULATORY_CARE_PROVIDER_SITE_OTHER): Payer: 59

## 2013-05-21 DIAGNOSIS — J449 Chronic obstructive pulmonary disease, unspecified: Secondary | ICD-10-CM

## 2013-05-22 MED ORDER — OMALIZUMAB 150 MG ~~LOC~~ SOLR
225.0000 mg | Freq: Once | SUBCUTANEOUS | Status: AC
  Administered 2013-05-22: 225 mg via SUBCUTANEOUS

## 2013-05-24 ENCOUNTER — Encounter: Payer: Self-pay | Admitting: Internal Medicine

## 2013-06-04 ENCOUNTER — Ambulatory Visit (INDEPENDENT_AMBULATORY_CARE_PROVIDER_SITE_OTHER): Payer: 59

## 2013-06-04 DIAGNOSIS — J45909 Unspecified asthma, uncomplicated: Secondary | ICD-10-CM

## 2013-06-04 MED ORDER — OMALIZUMAB 150 MG ~~LOC~~ SOLR
225.0000 mg | Freq: Once | SUBCUTANEOUS | Status: AC
  Administered 2013-06-04: 225 mg via SUBCUTANEOUS

## 2013-06-10 ENCOUNTER — Telehealth: Payer: Self-pay | Admitting: Internal Medicine

## 2013-06-10 MED ORDER — CIPROFLOXACIN HCL 500 MG PO TABS: 500.0000 mg | ORAL_TABLET | Freq: Two times a day (BID) | ORAL | Status: DC

## 2013-06-10 NOTE — Telephone Encounter (Signed)
Offer Cipro 500mg , # 20, 1 twice daily

## 2013-06-10 NOTE — Telephone Encounter (Signed)
Spoke with pt. States that she believes she might have a sinus infection. Reports sinus/chest congestion, coughing with production of green mucus. Has not tried anything OTC but does have Mucinex on hand. Would like antibiotic called in.  Allergies  Allergen Reactions  . Biaxin [Clarithromycin]     GI UPSET  . Penicillins   . Tetracycline     Current Outpatient Prescriptions on File Prior to Visit  Medication Sig Dispense Refill  . albuterol (PROAIR HFA) 108 (90 BASE) MCG/ACT inhaler Inhale 2 puffs into the lungs every 6 (six) hours as needed for wheezing or shortness of breath.  1 each  0  . ALPRAZolam (XANAX) 0.5 MG tablet Take 1 tablet (0.5 mg total) by mouth 2 (two) times daily as needed.  60 tablet  3  . azelastine (ASTELIN) 137 MCG/SPRAY nasal spray 1-2 puffs each nostril once daily at bedtime  90 mL  3  . BENICAR HCT 20-12.5 MG per tablet Take 1 tablet by mouth daily.      . clobetasol (TEMOVATE) 0.05 % cream Apply topically 2 (two) times daily.  69 g  4  . EPINEPHrine (EPI-PEN) 0.3 mg/0.3 mL SOAJ injection Inject 0.3 mLs (0.3 mg total) into the muscle once.  1 Device  11  . fluticasone (FLONASE) 50 MCG/ACT nasal spray Place 2 sprays into the nose daily.  48 g  3  . magnesium oxide (MAG-OX) 400 MG tablet Take 400 mg by mouth daily.      . mometasone-formoterol (DULERA) 100-5 MCG/ACT AERO Inhale 2 puffs into the lungs 2 (two) times daily. Rinse mouth  3 Inhaler  3  . omalizumab (XOLAIR) 150 MG injection Inject 225 mg into the skin every 14 (fourteen) days.  2 each  prn  . promethazine-codeine (PHENERGAN WITH CODEINE) 6.25-10 MG/5ML syrup Take 5 mLs by mouth every 6 (six) hours as needed for cough.  200 mL  0  . theophylline (THEODUR) 100 MG 12 hr tablet 1 twice daily with food  180 tablet  3   No current facility-administered medications on file prior to visit.    CY - please advise. Thanks.

## 2013-06-10 NOTE — Telephone Encounter (Signed)
Pt aware of recs. RX has been sent. Nothing further needed 

## 2013-06-18 ENCOUNTER — Telehealth: Payer: Self-pay | Admitting: Internal Medicine

## 2013-06-18 ENCOUNTER — Ambulatory Visit: Payer: 59

## 2013-06-18 NOTE — Telephone Encounter (Signed)
Per CY-okay to refill promethazine with codeine cough syrup and okay to come in for her Xolair injection. Thanks.

## 2013-06-18 NOTE — Telephone Encounter (Signed)
[id number].Kelsey Duran, CMA  

## 2013-06-18 NOTE — Telephone Encounter (Signed)
I called Kelsey Duran to let her know her Kelsey Duran would be arriving today. Her xolair just came in 10:15. Her appt. Is today at 11:00. She wanted to let you know Dr.Young that she's coughing a lot and she has a lot of congestion. She also asked if you would call in the cough med. You gave her 10/2012? Please call pt. On her cell or let me so I can call her back asap.

## 2013-06-19 ENCOUNTER — Ambulatory Visit (INDEPENDENT_AMBULATORY_CARE_PROVIDER_SITE_OTHER): Payer: 59

## 2013-06-19 ENCOUNTER — Other Ambulatory Visit: Payer: Self-pay

## 2013-06-19 DIAGNOSIS — J45909 Unspecified asthma, uncomplicated: Secondary | ICD-10-CM

## 2013-06-19 MED ORDER — PROMETHAZINE-CODEINE 6.25-10 MG/5ML PO SYRP: 5.0000 mL | ORAL_SOLUTION | Freq: Four times a day (QID) | ORAL | Status: DC | PRN

## 2013-06-19 NOTE — Telephone Encounter (Signed)
Pt aware that Rx to be ready to pick up when she comes in for Xolair injection.

## 2013-06-19 NOTE — Telephone Encounter (Signed)
Patient returning call.  [phone]

## 2013-06-21 NOTE — Telephone Encounter (Signed)
Dr.Young said it was ok to give Kelsey Duran her xolair shot. She came in I gave her her xolair shots and gave her the rx Joellen Jersey had given me.

## 2013-06-24 MED ORDER — OMALIZUMAB 150 MG ~~LOC~~ SOLR
225.0000 mg | Freq: Once | SUBCUTANEOUS | Status: AC
  Administered 2013-06-24: 225 mg via SUBCUTANEOUS

## 2013-07-03 ENCOUNTER — Ambulatory Visit: Payer: 59

## 2013-07-04 ENCOUNTER — Ambulatory Visit (INDEPENDENT_AMBULATORY_CARE_PROVIDER_SITE_OTHER): Payer: 59

## 2013-07-04 DIAGNOSIS — J45909 Unspecified asthma, uncomplicated: Secondary | ICD-10-CM

## 2013-07-05 MED ORDER — OMALIZUMAB 150 MG ~~LOC~~ SOLR
225.0000 mg | Freq: Once | SUBCUTANEOUS | Status: AC
  Administered 2013-07-05: 225 mg via SUBCUTANEOUS

## 2013-07-12 ENCOUNTER — Telehealth: Payer: Self-pay | Admitting: Internal Medicine

## 2013-07-12 NOTE — Telephone Encounter (Signed)
I do not see any documentation in chart re approval for [employer] with the pt and advised Joellen Jersey out of the office until next wk and will have to wait to discuss this with her  Pt verbalized understanding and was fine with this  Nothing further needed Please advise thanks

## 2013-07-16 NOTE — Telephone Encounter (Signed)
LMTCB-after speaking with Kelsey Duran-pt is not getting bills because she does not owe anything-looks as though her insurance will paying at 100%; now this could change for 2015 as insurance changes could have been made to her insurance plan.

## 2013-07-17 NOTE — Telephone Encounter (Signed)
Called spoke with patient, advised of [name] recs as stated below.  Pt aware as of now she does not owe anything for her Xolair as her insurance has been paying at 100% but that this could change for 2015.  Pt denied any further questions at this time.  Will sign off.

## 2013-07-17 NOTE — Telephone Encounter (Signed)
Pt is returning [name] call & can be reached at [phone].  Kelsey Duran

## 2013-07-18 ENCOUNTER — Ambulatory Visit: Payer: 59

## 2013-07-30 ENCOUNTER — Encounter: Payer: Self-pay | Admitting: Internal Medicine

## 2013-07-30 ENCOUNTER — Ambulatory Visit (INDEPENDENT_AMBULATORY_CARE_PROVIDER_SITE_OTHER)
Admission: RE | Admit: 2013-07-30 | Discharge: 2013-07-30 | Disposition: A | Discharge status: Home / Self Care | Payer: 59 | Source: Ambulatory Visit | Attending: Internal Medicine | Admitting: Internal Medicine

## 2013-07-30 ENCOUNTER — Ambulatory Visit (INDEPENDENT_AMBULATORY_CARE_PROVIDER_SITE_OTHER): Payer: 59 | Admitting: Internal Medicine

## 2013-07-30 VITALS — BP 114/74 | HR 83 | Ht 62.5 in | Wt 143.4 lb

## 2013-07-30 DIAGNOSIS — J449 Chronic obstructive pulmonary disease, unspecified: Secondary | ICD-10-CM

## 2013-07-30 DIAGNOSIS — J302 Other seasonal allergic rhinitis: Secondary | ICD-10-CM

## 2013-07-30 DIAGNOSIS — J309 Allergic rhinitis, unspecified: Secondary | ICD-10-CM

## 2013-07-30 DIAGNOSIS — J3089 Other allergic rhinitis: Secondary | ICD-10-CM

## 2013-07-30 DIAGNOSIS — J45909 Unspecified asthma, uncomplicated: Secondary | ICD-10-CM

## 2013-07-30 DIAGNOSIS — J984 Other disorders of lung: Secondary | ICD-10-CM

## 2013-07-30 NOTE — Assessment & Plan Note (Signed)
Start allergy vaccine

## 2013-07-30 NOTE — Assessment & Plan Note (Addendum)
Xolair too expensive. She wants to start allergy vaccine based on positive skin tests. We reviewed risk, benefit, logistics. Plan-start allergy vaccine, try prior authorization for [facility] or switch to insurance preferred. Letter supporting work at home

## 2013-07-30 NOTE — Assessment & Plan Note (Signed)
Former smoker. We will update chest x-ray.

## 2013-07-30 NOTE — Patient Instructions (Signed)
We can stop Xolair now as discussed   We can start allergy vaccine based on your most recent allergy skin tests. The allergy lab will call you when your vaccine is ready for you to start.  Order CXR- asthmatic bronchitis, hx lung nodules  We will try to get prior authorization to continue [facility], or switch to a product preferred by your insurance  I will send you a letter for your employer

## 2013-07-30 NOTE — Progress Notes (Signed)
03/30/11-58 year old female former smoker followed for asthma complicated by hypertension and history of 3 lung nodules followed by Dr. Cyndia Bent. PCP Dr Darcus Austin PFT 01/13/2003-FEV1 2.5 L/92%, FEV1/FVC 0.76 CT chest 08/11/2010 stable bilateral nodules without change since 2009. She reports doing well. Occasional wheeze is normal for her. She uses Advair once daily. This weekend noted increased cough, sneeze, wheeze. Peak flow dropped to 100. She started herself on prednisone 15 mg. 3 weeks ago she had been treated with antibiotics for diverticulitis(Dr. Collene Mares).  03/21/12- 57 year old female former smoker followed for asthma complicated by hypertension and history of 3 lung nodules followed by Dr. Cyndia Bent. PCP Dr Darcus Austin Increased SOB since last visit; wheezing, was recently given abx and pred; still coughing-yellow in color but not as thick now; Would like to get Tdap shot today. Gets flu vaccine at school Peak flow running around 200. A good day is 300. Office spirometry 03/21/2012-FEV1 1.50/59%, FVC 2.67/84%, FEV1/FVC 0.56 FEF 25-75% 0.54. Moderate obstructive airways disease. CXR 09/06/11 IMPRESSION:  No active lung disease. Small lung nodules noted on CT of the  chest are not visible on chest x-ray. Thoracolumbar scoliosis.  Original Report Authenticated By: Joretta Bachelor, M.D.   04/24/12- 58 year old female former smoker followed for asthma complicated by hypertension and history of 3 lung nodules followed by Dr. Cyndia Bent. PCP Dr Darcus Austin Denies any problems with her breathing. Cough has cleared up. Occasional palpitations, jaw pain, tingling right arm. Asks cardiology evaluation. Easy dyspnea on exertion lifting. Dizzy spells. Family history of heart disease. Took Xanax before coming today. Works 7 days per week, much stress, tired. Aware of heart burn. Continues Advair 500, Spiriva. Finishing prednisone taper. 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 Allergy profile 03/22/2012- total IgE 255.3 with significant elevations especially for dust mites, animal danders, grass and tree pollens EKG 04/24/12-sinus rhythm, NSSTTW changes.  05/23/12- 58 year old female former smoker followed for asthma complicated by hypertension and history of 3 lung nodules followed by Dr. Cyndia Bent. PCP Dr Darcus Austin No antihistamines, OTC cough syrups, or sleep meds in past 2 days  Sputum 03/23/12- Neg [employer], Nl [place] Hoarseness and raspy cough come and go- not different this Fall. She stopped aspirin she was taking daily for joint pains, and thinks her breathing might have improved. Discussed inhalers. She asks about re-trying theophylline- discussed. Wants to lose weight.  No change with fall weather. Frequent cough and hoarseness, usually not productive. Allergy skin test 05/23/12- strong puncture reactions for ragweed, tree pollens, dust mite, cat, dog, coarse. Also to beef pork and lamb. We discussed these reactions and I made distinction between environmental respiratory allergies and foods. She understands to avoid foods which cause symptoms but has not been aware of problems with any meats. PFT: 04/30/2012-moderate obstructive airways disease with response to bronchodilator, air trapping, diffusion mildly reduced. FEV1 1.92/81%, FEV1/FVC 0.59, RV 134%, DLCO 74%  07/05/12- 58 year old female former smoker followed for asthma complicated by hypertension and history of 3 lung nodules followed by Dr. Cyndia Bent. PCP Dr Darcus Austin FOLLOWS FOR: doing well with Dymista and Theophylline; has a tickle clearing throat alot right now. There've been a lot of colds going around her office. She continues theophylline which we discussed again. Alpha-galI IgE was normal, making allergy to mammalian meat unlikely  She asks about trying allergy vaccine again based on the new skin tests. CXR 09/06/11 IMPRESSION:  No active  lung  disease. Small lung nodules noted on CT of the  chest are not visible on chest x-ray. Thoracolumbar scoliosis.  Original Report Authenticated By: Joretta Bachelor, M.D.   09/04/12- 58 year old female former smoker followed for asthma complicated by hypertension and history of 3 lung nodules followed by Dr. Cyndia Bent. PCP Dr Darcus Austin FOLLOWS FOR: still having cough; insurance will not cover Advair -need new medication.  Coughing scant yellow, some shortness of breath, some frontal headache. Interested in [place]. PFTs did show significant reactive airways and she is allergy skin test positive. We will check IgE level.  10/11/11- 58 year old female former smoker followed for asthma/ COPD complicated by hypertension and history of 3 lung nodules followed by Dr. Cyndia Bent. PCP Dr Darcus Austin [facility] inhaler wasn't enough for control, also needed a prednisone taper. Finished prednisone yesterday and doing well today. We had a detailed discussion of Xolair including purpose, mechanism, risks, protocol. She is very interested. IgE 09/04/12- 217.4 CXR 09/05/12 IMPRESSION:  Hyperinflation configuration. No pulmonary edema, pneumonia, or  pleural effusion.  Original Report Authenticated By: Shanon Brow Call   11/08/2012 Acute OV  Complains of Severe cough, painful at times.  Has prod cough with light yellow mucus, hoarseness, some PND/sore throat, wheezing, chest tightness; currently taking omnicef 300mg  since 5.19.14.  hurt back this morning from coughing; in near-severe pain.  No hemoptysis , chest pain, edema, or orthopnea.  CXR last month with NAD.    04/11/13-  58 year old female former smoker followed for asthma/ COPD complicated by hypertension and history of 3 lung nodules followed by Dr. Cyndia Bent. PCP Dr Darcus Austin FOLLOWS FOR: cough-productive-yelllow(light) in color. Continues to do well with Xolair. Xolair 225/ 2 wks x 1 month so far Avoiding dairy and wheat products Daily cough productive white/yellow  thick mucus, better at the beach.  07/30/13- 58 year old female former smoker followed for asthma/ COPD complicated by hypertension and history of 3 lung nodules followed by Dr. Cyndia Bent. PCP Dr Darcus Austin FOLLOWS FOR: would like to discuss getting allergy vaccine vs Xolair(not affordable at this time) Wheezing more and using rescue inhaler more since last to Xolair shot one month ago. We discussed allergy vaccine as an option. Notices that she feels better at the beach and she feels better at home, compared with her work place. She says other workers also cough at work. She would like to explore working from home and asks support. Needs prior authorization for Dulera 100 although insurance asked her to change from Advair to [facility] within the past year.    ROS- see HPI Constitutional:   No-   weight loss, night sweats, fevers, chills, fatigue, lassitude. HEENT:    headaches, no-difficulty swallowing, tooth/dental problems, sore throat,     No-sneezing, no- itching, ear ache, nasal congestion, post nasal drip,  CV:  No-chest pain, orthopnea, PND, swelling in lower extremities, anasarca, dizziness, +palpitations Resp: +  shortness of breath with exertion or at rest.             No-  productive cough,  + non-productive cough,  No-  coughing up of blood.             No-change in color of mucus.  + Occasional wheezing.   Skin: No-   rash or lesions. GI:  No-  heartburn, indigestion, no-abdominal pain, no-nausea, vomiting,  GU:  MS:  No-   joint pain or swelling.   Neuro- nothing unusual Psych:  No- change in mood or affect. + depression or anxiety.  No memory loss.  OBJ General- Alert, Oriented, Affect-appropriate, Distress- none acute Skin- rash-none, lesions- none, excoriation- none Lymphadenopathy- none Head- atraumatic            Eyes- Gross vision intact, PERRLA, conjunctivae clear secretions            Ears- Hearing, canals            Nose- Clear, Septal dev, mucus, polyps, erosion,  perforation             Throat- Mallampati II , mucosa clear , drainage +white postnasal drip, tonsils- atrophic.  Neck- flexible , trachea midline, no stridor , thyroid nl, carotid no bruit Chest - symmetrical excursion , unlabored           Heart/CV- RRR , no murmur , no gallop  , no rub, nl s1 s2                           - JVD- none , edema- none, stasis changes- none, varices- none           Lung-   clear , dullness-none, rub- none no wheezing, +raspy upper airway cough           Chest wall-  Abd-  Br/ Gen/ Rectal- Not done, not indicated Extrem- cyanosis- none, clubbing, none, atrophy- none, strength- nl Neuro- grossly intact to observation

## 2013-07-31 ENCOUNTER — Ambulatory Visit (INDEPENDENT_AMBULATORY_CARE_PROVIDER_SITE_OTHER): Payer: 59

## 2013-07-31 DIAGNOSIS — J309 Allergic rhinitis, unspecified: Secondary | ICD-10-CM

## 2013-08-26 ENCOUNTER — Ambulatory Visit (INDEPENDENT_AMBULATORY_CARE_PROVIDER_SITE_OTHER): Payer: 59

## 2013-08-26 DIAGNOSIS — J309 Allergic rhinitis, unspecified: Secondary | ICD-10-CM

## 2013-08-29 ENCOUNTER — Ambulatory Visit (INDEPENDENT_AMBULATORY_CARE_PROVIDER_SITE_OTHER): Payer: 59

## 2013-08-29 ENCOUNTER — Telehealth: Payer: Self-pay | Admitting: Internal Medicine

## 2013-08-29 DIAGNOSIS — J309 Allergic rhinitis, unspecified: Secondary | ICD-10-CM

## 2013-08-29 NOTE — Telephone Encounter (Signed)
Per Vicente Males at [employer] for [facility] 100 has been approved through 07/30/2013 - 08/28/2016.  Pt will be notified and nothing further is needed

## 2013-08-29 NOTE — Telephone Encounter (Signed)
Per Latoya at [facility] can not get PA until patient tries and fails symbicort 160.  Will advise Dr. Annamaria Boots and see what he would like to do.

## 2013-09-02 ENCOUNTER — Ambulatory Visit (INDEPENDENT_AMBULATORY_CARE_PROVIDER_SITE_OTHER): Payer: 59

## 2013-09-02 DIAGNOSIS — J309 Allergic rhinitis, unspecified: Secondary | ICD-10-CM

## 2013-09-09 ENCOUNTER — Ambulatory Visit (INDEPENDENT_AMBULATORY_CARE_PROVIDER_SITE_OTHER): Payer: 59

## 2013-09-09 DIAGNOSIS — J309 Allergic rhinitis, unspecified: Secondary | ICD-10-CM

## 2013-09-13 ENCOUNTER — Ambulatory Visit (INDEPENDENT_AMBULATORY_CARE_PROVIDER_SITE_OTHER): Payer: 59

## 2013-09-13 DIAGNOSIS — J309 Allergic rhinitis, unspecified: Secondary | ICD-10-CM

## 2013-09-16 ENCOUNTER — Ambulatory Visit (INDEPENDENT_AMBULATORY_CARE_PROVIDER_SITE_OTHER): Payer: 59

## 2013-09-16 DIAGNOSIS — J309 Allergic rhinitis, unspecified: Secondary | ICD-10-CM

## 2013-09-19 ENCOUNTER — Ambulatory Visit: Payer: 59

## 2013-09-23 ENCOUNTER — Ambulatory Visit (INDEPENDENT_AMBULATORY_CARE_PROVIDER_SITE_OTHER): Payer: 59

## 2013-09-23 DIAGNOSIS — J309 Allergic rhinitis, unspecified: Secondary | ICD-10-CM

## 2013-09-26 ENCOUNTER — Ambulatory Visit: Payer: 59

## 2013-09-27 ENCOUNTER — Ambulatory Visit (INDEPENDENT_AMBULATORY_CARE_PROVIDER_SITE_OTHER): Payer: 59

## 2013-09-27 DIAGNOSIS — J309 Allergic rhinitis, unspecified: Secondary | ICD-10-CM

## 2013-09-30 ENCOUNTER — Ambulatory Visit (INDEPENDENT_AMBULATORY_CARE_PROVIDER_SITE_OTHER): Payer: 59

## 2013-09-30 DIAGNOSIS — J309 Allergic rhinitis, unspecified: Secondary | ICD-10-CM

## 2013-10-03 ENCOUNTER — Telehealth: Payer: Self-pay | Admitting: Internal Medicine

## 2013-10-03 ENCOUNTER — Ambulatory Visit: Payer: 59

## 2013-10-03 MED ORDER — MOMETASONE FURO-FORMOTEROL FUM 100-5 MCG/ACT IN AERO: 2.0000 | INHALATION_SPRAY | Freq: Two times a day (BID) | RESPIRATORY_TRACT | Status: DC

## 2013-10-03 MED ORDER — ALBUTEROL SULFATE HFA 108 (90 BASE) MCG/ACT IN AERS: 2.0000 | INHALATION_SPRAY | Freq: Four times a day (QID) | RESPIRATORY_TRACT | Status: DC | PRN

## 2013-10-03 NOTE — Telephone Encounter (Signed)
Spoke with pt. Aware dulera and [facility]air [facility]. Nothing further needed

## 2013-10-04 ENCOUNTER — Ambulatory Visit (INDEPENDENT_AMBULATORY_CARE_PROVIDER_SITE_OTHER): Payer: 59

## 2013-10-04 DIAGNOSIS — J309 Allergic rhinitis, unspecified: Secondary | ICD-10-CM

## 2013-10-08 ENCOUNTER — Ambulatory Visit (INDEPENDENT_AMBULATORY_CARE_PROVIDER_SITE_OTHER): Payer: 59

## 2013-10-08 DIAGNOSIS — J309 Allergic rhinitis, unspecified: Secondary | ICD-10-CM

## 2013-10-10 ENCOUNTER — Ambulatory Visit: Payer: 59 | Admitting: Internal Medicine

## 2013-10-11 ENCOUNTER — Ambulatory Visit (INDEPENDENT_AMBULATORY_CARE_PROVIDER_SITE_OTHER): Payer: 59

## 2013-10-11 DIAGNOSIS — J309 Allergic rhinitis, unspecified: Secondary | ICD-10-CM

## 2013-10-14 ENCOUNTER — Ambulatory Visit (INDEPENDENT_AMBULATORY_CARE_PROVIDER_SITE_OTHER): Payer: 59

## 2013-10-14 DIAGNOSIS — J309 Allergic rhinitis, unspecified: Secondary | ICD-10-CM

## 2013-10-16 ENCOUNTER — Ambulatory Visit (INDEPENDENT_AMBULATORY_CARE_PROVIDER_SITE_OTHER): Payer: 59

## 2013-10-16 DIAGNOSIS — J309 Allergic rhinitis, unspecified: Secondary | ICD-10-CM

## 2013-10-18 ENCOUNTER — Ambulatory Visit (INDEPENDENT_AMBULATORY_CARE_PROVIDER_SITE_OTHER): Payer: 59

## 2013-10-18 DIAGNOSIS — J309 Allergic rhinitis, unspecified: Secondary | ICD-10-CM

## 2013-10-22 ENCOUNTER — Ambulatory Visit (INDEPENDENT_AMBULATORY_CARE_PROVIDER_SITE_OTHER): Payer: 59

## 2013-10-22 DIAGNOSIS — J309 Allergic rhinitis, unspecified: Secondary | ICD-10-CM

## 2013-10-25 ENCOUNTER — Ambulatory Visit: Payer: 59

## 2013-10-28 ENCOUNTER — Ambulatory Visit (INDEPENDENT_AMBULATORY_CARE_PROVIDER_SITE_OTHER): Payer: 59

## 2013-10-28 DIAGNOSIS — J309 Allergic rhinitis, unspecified: Secondary | ICD-10-CM

## 2013-10-30 ENCOUNTER — Ambulatory Visit: Payer: 59

## 2013-10-31 ENCOUNTER — Ambulatory Visit (INDEPENDENT_AMBULATORY_CARE_PROVIDER_SITE_OTHER): Payer: 59

## 2013-10-31 DIAGNOSIS — J309 Allergic rhinitis, unspecified: Secondary | ICD-10-CM

## 2013-11-04 ENCOUNTER — Ambulatory Visit: Payer: 59

## 2013-11-05 ENCOUNTER — Ambulatory Visit (INDEPENDENT_AMBULATORY_CARE_PROVIDER_SITE_OTHER): Payer: 59

## 2013-11-05 DIAGNOSIS — J309 Allergic rhinitis, unspecified: Secondary | ICD-10-CM

## 2013-11-08 ENCOUNTER — Ambulatory Visit: Payer: 59

## 2013-11-14 ENCOUNTER — Ambulatory Visit (INDEPENDENT_AMBULATORY_CARE_PROVIDER_SITE_OTHER): Payer: 59

## 2013-11-14 DIAGNOSIS — J309 Allergic rhinitis, unspecified: Secondary | ICD-10-CM

## 2013-11-18 ENCOUNTER — Ambulatory Visit: Payer: 59

## 2013-11-19 ENCOUNTER — Encounter: Payer: Self-pay | Admitting: Internal Medicine

## 2013-11-19 ENCOUNTER — Ambulatory Visit (INDEPENDENT_AMBULATORY_CARE_PROVIDER_SITE_OTHER): Payer: 59

## 2013-11-19 DIAGNOSIS — J309 Allergic rhinitis, unspecified: Secondary | ICD-10-CM

## 2013-11-22 ENCOUNTER — Ambulatory Visit (INDEPENDENT_AMBULATORY_CARE_PROVIDER_SITE_OTHER): Payer: 59

## 2013-11-22 DIAGNOSIS — J309 Allergic rhinitis, unspecified: Secondary | ICD-10-CM

## 2013-11-25 ENCOUNTER — Ambulatory Visit (INDEPENDENT_AMBULATORY_CARE_PROVIDER_SITE_OTHER): Payer: 59

## 2013-11-25 DIAGNOSIS — J309 Allergic rhinitis, unspecified: Secondary | ICD-10-CM

## 2013-11-28 ENCOUNTER — Ambulatory Visit: Payer: 59

## 2013-11-29 ENCOUNTER — Ambulatory Visit (INDEPENDENT_AMBULATORY_CARE_PROVIDER_SITE_OTHER): Payer: 59

## 2013-11-29 DIAGNOSIS — J309 Allergic rhinitis, unspecified: Secondary | ICD-10-CM

## 2013-12-02 ENCOUNTER — Ambulatory Visit (INDEPENDENT_AMBULATORY_CARE_PROVIDER_SITE_OTHER): Payer: 59

## 2013-12-02 DIAGNOSIS — J309 Allergic rhinitis, unspecified: Secondary | ICD-10-CM

## 2013-12-03 ENCOUNTER — Ambulatory Visit: Payer: 59

## 2013-12-05 ENCOUNTER — Ambulatory Visit: Payer: 59

## 2013-12-09 ENCOUNTER — Other Ambulatory Visit: Payer: Self-pay | Admitting: Internal Medicine

## 2013-12-09 ENCOUNTER — Ambulatory Visit (INDEPENDENT_AMBULATORY_CARE_PROVIDER_SITE_OTHER): Payer: 59

## 2013-12-09 DIAGNOSIS — J309 Allergic rhinitis, unspecified: Secondary | ICD-10-CM

## 2013-12-13 ENCOUNTER — Ambulatory Visit (INDEPENDENT_AMBULATORY_CARE_PROVIDER_SITE_OTHER): Payer: 59

## 2013-12-13 DIAGNOSIS — J309 Allergic rhinitis, unspecified: Secondary | ICD-10-CM

## 2013-12-16 ENCOUNTER — Ambulatory Visit (INDEPENDENT_AMBULATORY_CARE_PROVIDER_SITE_OTHER): Payer: 59

## 2013-12-16 DIAGNOSIS — J309 Allergic rhinitis, unspecified: Secondary | ICD-10-CM

## 2013-12-19 ENCOUNTER — Ambulatory Visit (INDEPENDENT_AMBULATORY_CARE_PROVIDER_SITE_OTHER): Payer: 59

## 2013-12-19 DIAGNOSIS — J309 Allergic rhinitis, unspecified: Secondary | ICD-10-CM

## 2013-12-23 ENCOUNTER — Ambulatory Visit (INDEPENDENT_AMBULATORY_CARE_PROVIDER_SITE_OTHER): Payer: 59

## 2013-12-23 DIAGNOSIS — J309 Allergic rhinitis, unspecified: Secondary | ICD-10-CM

## 2013-12-27 ENCOUNTER — Ambulatory Visit (INDEPENDENT_AMBULATORY_CARE_PROVIDER_SITE_OTHER): Payer: 59

## 2013-12-27 DIAGNOSIS — J309 Allergic rhinitis, unspecified: Secondary | ICD-10-CM

## 2013-12-30 ENCOUNTER — Ambulatory Visit (INDEPENDENT_AMBULATORY_CARE_PROVIDER_SITE_OTHER): Payer: 59

## 2013-12-30 DIAGNOSIS — J309 Allergic rhinitis, unspecified: Secondary | ICD-10-CM

## 2014-01-02 ENCOUNTER — Ambulatory Visit (INDEPENDENT_AMBULATORY_CARE_PROVIDER_SITE_OTHER): Payer: 59

## 2014-01-02 DIAGNOSIS — J309 Allergic rhinitis, unspecified: Secondary | ICD-10-CM

## 2014-01-03 ENCOUNTER — Ambulatory Visit (INDEPENDENT_AMBULATORY_CARE_PROVIDER_SITE_OTHER): Payer: 59

## 2014-01-03 DIAGNOSIS — J309 Allergic rhinitis, unspecified: Secondary | ICD-10-CM

## 2014-01-06 ENCOUNTER — Ambulatory Visit: Payer: 59

## 2014-01-07 ENCOUNTER — Ambulatory Visit (INDEPENDENT_AMBULATORY_CARE_PROVIDER_SITE_OTHER): Payer: 59

## 2014-01-07 DIAGNOSIS — J309 Allergic rhinitis, unspecified: Secondary | ICD-10-CM

## 2014-01-10 ENCOUNTER — Ambulatory Visit (INDEPENDENT_AMBULATORY_CARE_PROVIDER_SITE_OTHER): Payer: 59

## 2014-01-10 DIAGNOSIS — J309 Allergic rhinitis, unspecified: Secondary | ICD-10-CM

## 2014-01-13 ENCOUNTER — Ambulatory Visit (INDEPENDENT_AMBULATORY_CARE_PROVIDER_SITE_OTHER): Payer: 59

## 2014-01-13 DIAGNOSIS — J309 Allergic rhinitis, unspecified: Secondary | ICD-10-CM

## 2014-01-17 ENCOUNTER — Ambulatory Visit (INDEPENDENT_AMBULATORY_CARE_PROVIDER_SITE_OTHER): Payer: 59

## 2014-01-17 DIAGNOSIS — J309 Allergic rhinitis, unspecified: Secondary | ICD-10-CM

## 2014-01-24 ENCOUNTER — Ambulatory Visit (INDEPENDENT_AMBULATORY_CARE_PROVIDER_SITE_OTHER): Payer: 59

## 2014-01-24 DIAGNOSIS — J309 Allergic rhinitis, unspecified: Secondary | ICD-10-CM

## 2014-01-29 ENCOUNTER — Encounter: Payer: Self-pay | Admitting: Internal Medicine

## 2014-01-30 ENCOUNTER — Telehealth: Payer: Self-pay | Admitting: Internal Medicine

## 2014-01-30 ENCOUNTER — Ambulatory Visit (INDEPENDENT_AMBULATORY_CARE_PROVIDER_SITE_OTHER): Payer: 59 | Admitting: Internal Medicine

## 2014-01-30 ENCOUNTER — Ambulatory Visit (INDEPENDENT_AMBULATORY_CARE_PROVIDER_SITE_OTHER): Payer: 59

## 2014-01-30 ENCOUNTER — Encounter: Payer: Self-pay | Admitting: Internal Medicine

## 2014-01-30 VITALS — BP 114/80 | HR 98 | Ht 62.5 in | Wt 139.4 lb

## 2014-01-30 DIAGNOSIS — K219 Gastro-esophageal reflux disease without esophagitis: Secondary | ICD-10-CM

## 2014-01-30 DIAGNOSIS — J449 Chronic obstructive pulmonary disease, unspecified: Secondary | ICD-10-CM

## 2014-01-30 DIAGNOSIS — J309 Allergic rhinitis, unspecified: Secondary | ICD-10-CM

## 2014-01-30 MED ORDER — EPINEPHRINE 0.3 MG/0.3ML IJ SOAJ: 0.3000 mg | Freq: Once | INTRAMUSCULAR | Status: DC

## 2014-01-30 MED ORDER — PREDNISONE 10 MG PO TABS: ORAL_TABLET | ORAL | Status: DC

## 2014-01-30 MED ORDER — MONTELUKAST SODIUM 10 MG PO TABS: 10.0000 mg | ORAL_TABLET | Freq: Every day | ORAL | Status: DC

## 2014-01-30 NOTE — Progress Notes (Signed)
03/30/11-58 year old female former smoker followed for asthma complicated by hypertension and history of 3 lung nodules followed by Dr. Cyndia Bent. PCP Dr Darcus Austin PFT 01/13/2003-FEV1 2.5 L/92%, FEV1/FVC 0.76 CT chest 08/11/2010 stable bilateral nodules without change since 2009. She reports doing well. Occasional wheeze is normal for her. She uses Advair once daily. This weekend noted increased cough, sneeze, wheeze. Peak flow dropped to 100. She started herself on prednisone 15 mg. 3 weeks ago she had been treated with antibiotics for diverticulitis(Dr. Collene Mares).  03/21/12- 57 year old female former smoker followed for asthma complicated by hypertension and history of 3 lung nodules followed by Dr. Cyndia Bent. PCP Dr Darcus Austin Increased SOB since last visit; wheezing, was recently given abx and pred; still coughing-yellow in color but not as thick now; Would like to get Tdap shot today. Gets flu vaccine at school Peak flow running around 200. A good day is 300. Office spirometry 03/21/2012-FEV1 1.50/59%, FVC 2.67/84%, FEV1/FVC 0.56 FEF 25-75% 0.54. Moderate obstructive airways disease. CXR 09/06/11 IMPRESSION:  No active lung disease. Small lung nodules noted on CT of the  chest are not visible on chest x-ray. Thoracolumbar scoliosis.  Original Report Authenticated By: Joretta Bachelor, M.D.   04/24/12- 58 year old female former smoker followed for asthma complicated by hypertension and history of 3 lung nodules followed by Dr. Cyndia Bent. PCP Dr Darcus Austin Denies any problems with her breathing. Cough has cleared up. Occasional palpitations, jaw pain, tingling right arm. Asks cardiology evaluation. Easy dyspnea on exertion lifting. Dizzy spells. Family history of heart disease. Took Xanax before coming today. Works 7 days per week, much stress, tired. Aware of heart burn. Continues Advair 500, Spiriva. Finishing prednisone taper. 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 Allergy profile 03/22/2012- total IgE 255.3 with significant elevations especially for dust mites, animal danders, grass and tree pollens EKG 04/24/12-sinus rhythm, NSSTTW changes.  05/23/12- 58 year old female former smoker followed for asthma complicated by hypertension and history of 3 lung nodules followed by Dr. Cyndia Bent. PCP Dr Darcus Austin No antihistamines, OTC cough syrups, or sleep meds in past 2 days  Sputum 03/23/12- Neg [employer], Nl [place] Hoarseness and raspy cough come and go- not different this Fall. She stopped aspirin she was taking daily for joint pains, and thinks her breathing might have improved. Discussed inhalers. She asks about re-trying theophylline- discussed. Wants to lose weight.  No change with fall weather. Frequent cough and hoarseness, usually not productive. Allergy skin test 05/23/12- strong puncture reactions for ragweed, tree pollens, dust mite, cat, dog, coarse. Also to beef pork and lamb. We discussed these reactions and I made distinction between environmental respiratory allergies and foods. She understands to avoid foods which cause symptoms but has not been aware of problems with any meats. PFT: 04/30/2012-moderate obstructive airways disease with response to bronchodilator, air trapping, diffusion mildly reduced. FEV1 1.92/81%, FEV1/FVC 0.59, RV 134%, DLCO 74%  07/05/12- 58 year old female former smoker followed for asthma complicated by hypertension and history of 3 lung nodules followed by Dr. Cyndia Bent. PCP Dr Darcus Austin FOLLOWS FOR: doing well with Dymista and Theophylline; has a tickle clearing throat alot right now. There've been a lot of colds going around her office. She continues theophylline which we discussed again. Alpha-galI IgE was normal, making allergy to mammalian meat unlikely  She asks about trying allergy vaccine again based on the new skin tests. CXR 09/06/11 IMPRESSION:  No active  lung  disease. Small lung nodules noted on CT of the  chest are not visible on chest x-ray. Thoracolumbar scoliosis.  Original Report Authenticated By: Joretta Bachelor, M.D.   09/04/12- 58 year old female former smoker followed for asthma complicated by hypertension and history of 3 lung nodules followed by Dr. Cyndia Bent. PCP Dr Darcus Austin FOLLOWS FOR: still having cough; insurance will not cover Advair -need new medication.  Coughing scant yellow, some shortness of breath, some frontal headache. Interested in [place]. PFTs did show significant reactive airways and she is allergy skin test positive. We will check IgE level.  10/11/11- 58 year old female former smoker followed for asthma/ COPD complicated by hypertension and history of 3 lung nodules followed by Dr. Cyndia Bent. PCP Dr Darcus Austin [facility] inhaler wasn't enough for control, also needed a prednisone taper. Finished prednisone yesterday and doing well today. We had a detailed discussion of Xolair including purpose, mechanism, risks, protocol. She is very interested. IgE 09/04/12- 217.4 CXR 09/05/12 IMPRESSION:  Hyperinflation configuration. No pulmonary edema, pneumonia, or  pleural effusion.  Original Report Authenticated By: Shanon Brow Call   11/08/2012 Acute OV  Complains of Severe cough, painful at times.  Has prod cough with light yellow mucus, hoarseness, some PND/sore throat, wheezing, chest tightness; currently taking omnicef 300mg  since 5.19.14.  hurt back this morning from coughing; in near-severe pain.  No hemoptysis , chest pain, edema, or orthopnea.  CXR last month with NAD.    04/11/13-  58 year old female former smoker followed for asthma/ COPD complicated by hypertension and history of 3 lung nodules followed by Dr. Cyndia Bent. PCP Dr Darcus Austin FOLLOWS FOR: cough-productive-yelllow(light) in color. Continues to do well with Xolair. Xolair 225/ 2 wks x 1 month so far Avoiding dairy and wheat products Daily cough productive white/yellow  thick mucus, better at the beach.  07/30/13- 58 year old female former smoker followed for asthma/ COPD complicated by hypertension and history of 3 lung nodules followed by Dr. Cyndia Bent. PCP Dr Darcus Austin FOLLOWS FOR: would like to discuss getting allergy vaccine vs Xolair(not affordable at this time) Wheezing more and using rescue inhaler more since last  Xolair shot one month ago. We discussed allergy vaccine as an option. Notices that she feels better at the beach and she feels better at home, compared with her work place. She says other workers also cough at work. She would like to explore working from home and asks support. Needs prior authorization for Dulera 100 although insurance asked her to change from Advair to [facility] within the past year.  01/30/14- 58 year old female former smoker followed for asthma/ COPD complicated by hypertension and history of 3 lung nodules followed by Dr. Cyndia Bent. PCP Dr Darcus Austin ACUTE VISIT: Increased cough, SOB and wheezing. Getting worse. No longer on Xolair/ too expensive,  and unsure if allergy vaccine 1:50 GH is truly helping. She only rarely notices some heartburn. Constant cough and chest tightness, white sputum, no fever or chest pain. Significant problem of chronic obstructive asthma. CXR 07/30/13 IMPRESSION:  No acute cardiopulmonary abnormality seen. Hyperexpansion of the  lungs is noted suggesting chronic obstructive pulmonary disease.  Electronically Signed  By: Sabino Dick M.D.  On: 07/30/2013 16:33   ROS- see HPI Constitutional:   No-   weight loss, night sweats, fevers, chills, fatigue, lassitude. HEENT:    headaches, no-difficulty swallowing, tooth/dental problems, sore throat,     No-sneezing, no- itching, ear ache, nasal congestion, post nasal drip,  CV:  No-chest pain, orthopnea, PND, swelling in lower extremities, anasarca,  dizziness, +palpitations Resp: +  shortness of breath with exertion or at rest.             + productive  cough,  + non-productive cough,  No-  coughing up of blood.             No-change in color of mucus.  +  wheezing.   Skin: No-   rash or lesions. GI:  No-  heartburn, indigestion, no-abdominal pain, no-nausea, vomiting,  GU:  MS:  No-   joint pain or swelling.   Neuro- nothing unusual Psych:  No- change in mood or affect. + depression or anxiety.  No memory loss.  OBJ General- Alert, Oriented, Affect-appropriate, Distress- none acute Skin- rash-none, lesions- none, excoriation- none Lymphadenopathy- none Head- atraumatic            Eyes- Gross vision intact, PERRLA, conjunctivae clear secretions            Ears- Hearing, canals            Nose- Clear, Septal dev, mucus, polyps, erosion, perforation             Throat- Mallampati II , mucosa clear , drainage +white postnasal drip, tonsils- atrophic.  Neck- flexible , trachea midline, no stridor , thyroid nl, carotid no bruit Chest - symmetrical excursion , unlabored           Heart/CV- RRR , no murmur , no gallop  , no rub, nl s1 s2                           - JVD- none , edema- none, stasis changes- none, varices- none           Lung-   clear , dullness-none, rub- none, wheezing+ expiratory, +raspy upper airway cough           Chest wall-  Abd-  Br/ Gen/ Rectal- Not done, not indicated Extrem- cyanosis- none, clubbing, none, atrophy- none, strength- nl Neuro- grossly intact to observation

## 2014-01-30 NOTE — Telephone Encounter (Signed)
Called spoke with pt. She c/o having hard time breathing, prod cough-yellow phlem, wheezing, chest tx,, chest congestion. Pt reports getting worse. appt scheduled to see CDY today. Nothing further needed

## 2014-01-30 NOTE — Patient Instructions (Addendum)
Reflux precautions-  Omeprazole otc    1 twice daily before meals                                   Avoid pepermint, chocolate, caffeine                                   Don't eat for 2 hours before you lie down  Sample # 2 Anoro inhaler  1 puff, once daily - maintenance    Try this instead of Dulera  Sample # 2 Qvar 80 inhaler   2 puffs, then rinse mouth well, twice daily  Script for Singulair/ montelukast    1 daily- airway anti-inflammatory

## 2014-01-31 ENCOUNTER — Ambulatory Visit: Payer: 59

## 2014-01-31 NOTE — Assessment & Plan Note (Signed)
Moderate to severe persistent asthma Plan-reinforced reflux precautions, try again with Singulair, samples of Anoro and Qvar 80, prolonged prednisone taper to hold

## 2014-01-31 NOTE — Assessment & Plan Note (Signed)
Maintain precautions, twice daily acid blocker

## 2014-02-07 ENCOUNTER — Ambulatory Visit (INDEPENDENT_AMBULATORY_CARE_PROVIDER_SITE_OTHER): Payer: 59

## 2014-02-07 DIAGNOSIS — J309 Allergic rhinitis, unspecified: Secondary | ICD-10-CM

## 2014-02-14 ENCOUNTER — Ambulatory Visit (INDEPENDENT_AMBULATORY_CARE_PROVIDER_SITE_OTHER): Payer: 59

## 2014-02-14 DIAGNOSIS — J309 Allergic rhinitis, unspecified: Secondary | ICD-10-CM

## 2014-02-19 ENCOUNTER — Encounter: Payer: Self-pay | Admitting: Internal Medicine

## 2014-02-20 ENCOUNTER — Ambulatory Visit (INDEPENDENT_AMBULATORY_CARE_PROVIDER_SITE_OTHER): Payer: 59

## 2014-02-20 DIAGNOSIS — J309 Allergic rhinitis, unspecified: Secondary | ICD-10-CM

## 2014-02-21 ENCOUNTER — Ambulatory Visit: Payer: 59 | Admitting: Internal Medicine

## 2014-02-21 ENCOUNTER — Ambulatory Visit: Payer: 59

## 2014-02-21 ENCOUNTER — Ambulatory Visit (INDEPENDENT_AMBULATORY_CARE_PROVIDER_SITE_OTHER): Payer: 59

## 2014-02-21 DIAGNOSIS — J309 Allergic rhinitis, unspecified: Secondary | ICD-10-CM

## 2014-02-28 ENCOUNTER — Ambulatory Visit: Payer: 59

## 2014-03-10 ENCOUNTER — Ambulatory Visit (INDEPENDENT_AMBULATORY_CARE_PROVIDER_SITE_OTHER): Payer: 59

## 2014-03-10 DIAGNOSIS — J309 Allergic rhinitis, unspecified: Secondary | ICD-10-CM

## 2014-03-21 ENCOUNTER — Ambulatory Visit (INDEPENDENT_AMBULATORY_CARE_PROVIDER_SITE_OTHER): Payer: 59

## 2014-03-21 DIAGNOSIS — J309 Allergic rhinitis, unspecified: Secondary | ICD-10-CM

## 2014-03-28 ENCOUNTER — Ambulatory Visit (INDEPENDENT_AMBULATORY_CARE_PROVIDER_SITE_OTHER): Payer: 59

## 2014-03-28 DIAGNOSIS — J309 Allergic rhinitis, unspecified: Secondary | ICD-10-CM

## 2014-04-01 ENCOUNTER — Encounter: Payer: Self-pay | Admitting: Internal Medicine

## 2014-04-01 ENCOUNTER — Ambulatory Visit (INDEPENDENT_AMBULATORY_CARE_PROVIDER_SITE_OTHER): Payer: 59 | Admitting: Internal Medicine

## 2014-04-01 VITALS — BP 98/62 | HR 91 | Ht 64.0 in | Wt 146.0 lb

## 2014-04-01 DIAGNOSIS — J449 Chronic obstructive pulmonary disease, unspecified: Secondary | ICD-10-CM

## 2014-04-01 DIAGNOSIS — J309 Allergic rhinitis, unspecified: Secondary | ICD-10-CM

## 2014-04-01 DIAGNOSIS — J3089 Other allergic rhinitis: Secondary | ICD-10-CM

## 2014-04-01 DIAGNOSIS — J302 Other seasonal allergic rhinitis: Secondary | ICD-10-CM

## 2014-04-01 MED ORDER — UMECLIDINIUM-VILANTEROL 62.5-25 MCG/INH IN AEPB: 1.0000 | INHALATION_SPRAY | Freq: Every day | RESPIRATORY_TRACT | Status: DC

## 2014-04-01 MED ORDER — MONTELUKAST SODIUM 10 MG PO TABS: 10.0000 mg | ORAL_TABLET | Freq: Every day | ORAL | Status: DC

## 2014-04-01 MED ORDER — BECLOMETHASONE DIPROPIONATE 80 MCG/ACT IN AERS: 2.0000 | INHALATION_SPRAY | Freq: Two times a day (BID) | RESPIRATORY_TRACT | Status: DC

## 2014-04-01 NOTE — Assessment & Plan Note (Signed)
Now in good control until the next exacerbation Plan-add Singulair, repeat samples of Anoro inhaler and Qvar 46for comparison with [facility]

## 2014-04-01 NOTE — Assessment & Plan Note (Signed)
Plan-advance vaccine concentration to 1:10

## 2014-04-01 NOTE — Progress Notes (Signed)
03/30/11-58 year old female former smoker followed for asthma complicated by hypertension and history of 3 lung nodules followed by Dr. Cyndia Bent. PCP Dr Darcus Austin PFT 01/13/2003-FEV1 2.5 L/92%, FEV1/FVC 0.76 CT chest 08/11/2010 stable bilateral nodules without change since 2009. She reports doing well. Occasional wheeze is normal for her. She uses Advair once daily. This weekend noted increased cough, sneeze, wheeze. Peak flow dropped to 100. She started herself on prednisone 15 mg. 3 weeks ago she had been treated with antibiotics for diverticulitis(Dr. Collene Mares).  03/21/12- 57 year old female former smoker followed for asthma complicated by hypertension and history of 3 lung nodules followed by Dr. Cyndia Bent. PCP Dr Darcus Austin Increased SOB since last visit; wheezing, was recently given abx and pred; still coughing-yellow in color but not as thick now; Would like to get Tdap shot today. Gets flu vaccine at school Peak flow running around 200. A good day is 300. Office spirometry 03/21/2012-FEV1 1.50/59%, FVC 2.67/84%, FEV1/FVC 0.56 FEF 25-75% 0.54. Moderate obstructive airways disease. CXR 09/06/11 IMPRESSION:  No active lung disease. Small lung nodules noted on CT of the  chest are not visible on chest x-ray. Thoracolumbar scoliosis.  Original Report Authenticated By: Joretta Bachelor, M.D.   04/24/12- 58 year old female former smoker followed for asthma complicated by hypertension and history of 3 lung nodules followed by Dr. Cyndia Bent. PCP Dr Darcus Austin Denies any problems with her breathing. Cough has cleared up. Occasional palpitations, jaw pain, tingling right arm. Asks cardiology evaluation. Easy dyspnea on exertion lifting. Dizzy spells. Family history of heart disease. Took Xanax before coming today. Works 7 days per week, much stress, tired. Aware of heart burn. Continues Advair 500, Spiriva. Finishing prednisone taper. 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 Allergy profile 03/22/2012- total IgE 255.3 with significant elevations especially for dust mites, animal danders, grass and tree pollens EKG 04/24/12-sinus rhythm, NSSTTW changes.  05/23/12- 58 year old female former smoker followed for asthma complicated by hypertension and history of 3 lung nodules followed by Dr. Cyndia Bent. PCP Dr Darcus Austin No antihistamines, OTC cough syrups, or sleep meds in past 2 days  Sputum 03/23/12- Neg AF[employer], Nl [place] Hoarseness and raspy cough come and go- not different this Fall. She stopped aspirin she was taking daily for joint pains, and thinks her breathing might have improved. Discussed inhalers. She asks about re-trying theophylline- discussed. Wants to lose weight.  No change with fall weather. Frequent cough and hoarseness, usually not productive. Allergy skin test 05/23/12- strong puncture reactions for ragweed, tree pollens, dust mite, cat, dog, coarse. Also to beef pork and lamb. We discussed these reactions and I made distinction between environmental respiratory allergies and foods. She understands to avoid foods which cause symptoms but has not been aware of problems with any meats. PFT: 04/30/2012-moderate obstructive airways disease with response to bronchodilator, air trapping, diffusion mildly reduced. FEV1 1.92/81%, FEV1/FVC 0.59, RV 134%, DLCO 74%  07/05/12- 58 year old female former smoker followed for asthma complicated by hypertension and history of 3 lung nodules followed by Dr. Cyndia Bent. PCP Dr Darcus Austin FOLLOWS FOR: doing well with Dymista and Theophylline; has a tickle clearing throat alot right now. There've been a lot of colds going around her office. She continues theophylline which we discussed again. Alpha-galI IgE was normal, making allergy to mammalian meat unlikely  She asks about trying allergy vaccine again based on the new skin tests. CXR 09/06/11 IMPRESSION:  No active  lung  disease. Small lung nodules noted on CT of the  chest are not visible on chest x-ray. Thoracolumbar scoliosis.  Original Report Authenticated By: Joretta Bachelor, M.D.   09/04/12- 58 year old female former smoker followed for asthma complicated by hypertension and history of 3 lung nodules followed by Dr. Cyndia Bent. PCP Dr Darcus Austin FOLLOWS FOR: still having cough; insurance will not cover Advair -need new medication.  Coughing scant yellow, some shortness of breath, some frontal headache. Interested in [place]. PFTs did show significant reactive airways and she is allergy skin test positive. We will check IgE level.  10/11/11- 58 year old female former smoker followed for asthma/ COPD complicated by hypertension and history of 3 lung nodules followed by Dr. Cyndia Bent. PCP Dr Darcus Austin [facility] inhaler wasn't enough for control, also needed a prednisone taper. Finished prednisone yesterday and doing well today. We had a detailed discussion of Xolair including purpose, mechanism, risks, protocol. She is very interested. IgE 09/04/12- 217.4 CXR 09/05/12 IMPRESSION:  Hyperinflation configuration. No pulmonary edema, pneumonia, or  pleural effusion.  Original Report Authenticated By: Shanon Brow Call   11/08/2012 Acute OV  Complains of Severe cough, painful at times.  Has prod cough with light yellow mucus, hoarseness, some PND/sore throat, wheezing, chest tightness; currently taking omnicef 300mg  since 5.19.14.  hurt back this morning from coughing; in near-severe pain.  No hemoptysis , chest pain, edema, or orthopnea.  CXR last month with NAD.    04/11/13-  58 year old female former smoker followed for asthma/ COPD complicated by hypertension and history of 3 lung nodules followed by Dr. Cyndia Bent. PCP Dr Darcus Austin FOLLOWS FOR: cough-productive-yelllow(light) in color. Continues to do well with Xolair. Xolair 225/ 2 wks x 1 month so far Avoiding dairy and wheat products Daily cough productive white/yellow  thick mucus, better at the beach.  07/30/13- 58 year old female former smoker followed for asthma/ COPD complicated by hypertension and history of 3 lung nodules followed by Dr. Cyndia Bent. PCP Dr Darcus Austin FOLLOWS FOR: would like to discuss getting allergy vaccine vs Xolair(not affordable at this time) Wheezing more and using rescue inhaler more since last  Xolair shot one month ago. We discussed allergy vaccine as an option. Notices that she feels better at the beach and she feels better at home, compared with her work place. She says other workers also cough at work. She would like to explore working from home and asks support. Needs prior authorization for Dulera 100 although insurance asked her to change from Advair to [facility] within the past year.  01/30/14- 58 year old female former smoker followed for asthma/ COPD complicated by hypertension and history of 3 lung nodules followed by Dr. Cyndia Bent. PCP Dr Darcus Austin ACUTE VISIT: Increased cough, SOB and wheezing. Getting worse. No longer on Xolair/ too expensive,  and unsure if allergy vaccine 1:50 GH is truly helping. She only rarely notices some heartburn. Constant cough and chest tightness, white sputum, no fever or chest pain. Significant problem of chronic obstructive asthma. CXR 07/30/13 IMPRESSION:  No acute cardiopulmonary abnormality seen. Hyperexpansion of the  lungs is noted suggesting chronic obstructive pulmonary disease.  Electronically Signed  By: Sabino Dick M.D.  On: 07/30/2013 16:33  04/01/14- 58 year old female former smoker followed for asthma/ COPD complicated by hypertension and history of 3 lung nodules followed by Dr. Cyndia Bent, GERD.                   PCP Dr Darcus Austin FOLLOWS FOR: Still on allergy vaccine 1:50 GH and doing  well. Long course of prednisone during last flareup helped resolve her wheezing and she has been well since. Has had flu shot. Little rhinitis now using Astelin plus Flonase. We can advance allergy  vaccine to 1:10 [place] She "lost" samples of Anoro and Qvar 80 we gave last time but is interested in trying for comparison with Dulera.  ROS- see HPI Constitutional:   No-   weight loss, night sweats, fevers, chills, fatigue, lassitude. HEENT:    headaches, no-difficulty swallowing, tooth/dental problems, sore throat,     No-sneezing, no- itching, ear ache, nasal congestion, post nasal drip,  CV:  No-chest pain, orthopnea, PND, swelling in lower extremities, anasarca, dizziness, +palpitations Resp: +  shortness of breath with exertion or at rest.             + productive cough,  + non-productive cough,  No-  coughing up of blood.             No-change in color of mucus.  +  wheezing.   Skin: No-   rash or lesions. GI:  No-  heartburn, indigestion, no-abdominal pain, no-nausea, vomiting,  GU:  MS:  No-   joint pain or swelling.   Neuro- nothing unusual Psych:  No- change in mood or affect. + depression or anxiety.  No memory loss.  OBJ General- Alert, Oriented, Affect-appropriate, Distress- none acute Skin- rash-none, lesions- none, excoriation- none Lymphadenopathy- none Head- atraumatic            Eyes- Gross vision intact, PERRLA, conjunctivae clear secretions            Ears- Hearing, canals            Nose- Clear, Septal dev, mucus, polyps, erosion, perforation             Throat- Mallampati II , mucosa clear , drainage-none, tonsils- atrophic.  Neck- flexible , trachea midline, no stridor , thyroid nl, carotid no bruit Chest - symmetrical excursion , unlabored           Heart/CV- RRR , no murmur , no gallop  , no rub, nl s1 s2                           - JVD- none , edema- none, stasis changes- none, varices- none           Lung-   +Coarse breath sounds , dullness-none, rub- none, wheezing-none, cough +                                      today           Chest wall-  Abd-  Br/ Gen/ Rectal- Not done, not indicated Extrem- cyanosis- none, clubbing, none, atrophy- none, strength-  nl Neuro- grossly intact to observation

## 2014-04-01 NOTE — Patient Instructions (Signed)
Script sent to start Singulair/ montlukast airway anti-inflammatory once daily  Sample Anoro 1 puff, one time daily  And Qvar 80  2 puffs , then rinse mouth, two times daily     Try these instead of Dulera  When these run out, go back to [facility] for comparison  We are going to move the concentration of your allergy shots up to 1:10

## 2014-04-04 ENCOUNTER — Ambulatory Visit (INDEPENDENT_AMBULATORY_CARE_PROVIDER_SITE_OTHER): Payer: 59

## 2014-04-04 DIAGNOSIS — J309 Allergic rhinitis, unspecified: Secondary | ICD-10-CM

## 2014-04-11 ENCOUNTER — Ambulatory Visit: Payer: 59

## 2014-04-14 ENCOUNTER — Ambulatory Visit (INDEPENDENT_AMBULATORY_CARE_PROVIDER_SITE_OTHER): Payer: 59

## 2014-04-14 DIAGNOSIS — J309 Allergic rhinitis, unspecified: Secondary | ICD-10-CM

## 2014-04-15 ENCOUNTER — Ambulatory Visit: Payer: 59

## 2014-04-25 ENCOUNTER — Ambulatory Visit (INDEPENDENT_AMBULATORY_CARE_PROVIDER_SITE_OTHER): Payer: 59

## 2014-04-25 DIAGNOSIS — J309 Allergic rhinitis, unspecified: Secondary | ICD-10-CM

## 2014-05-02 ENCOUNTER — Ambulatory Visit (INDEPENDENT_AMBULATORY_CARE_PROVIDER_SITE_OTHER): Payer: 59

## 2014-05-02 DIAGNOSIS — J309 Allergic rhinitis, unspecified: Secondary | ICD-10-CM

## 2014-05-09 ENCOUNTER — Ambulatory Visit (INDEPENDENT_AMBULATORY_CARE_PROVIDER_SITE_OTHER): Payer: 59

## 2014-05-09 DIAGNOSIS — J309 Allergic rhinitis, unspecified: Secondary | ICD-10-CM

## 2014-05-16 ENCOUNTER — Ambulatory Visit (INDEPENDENT_AMBULATORY_CARE_PROVIDER_SITE_OTHER): Payer: 59

## 2014-05-16 DIAGNOSIS — J309 Allergic rhinitis, unspecified: Secondary | ICD-10-CM

## 2014-05-23 ENCOUNTER — Ambulatory Visit (INDEPENDENT_AMBULATORY_CARE_PROVIDER_SITE_OTHER): Payer: 59

## 2014-05-23 DIAGNOSIS — J309 Allergic rhinitis, unspecified: Secondary | ICD-10-CM

## 2014-05-30 ENCOUNTER — Ambulatory Visit (INDEPENDENT_AMBULATORY_CARE_PROVIDER_SITE_OTHER): Payer: 59

## 2014-05-30 DIAGNOSIS — J309 Allergic rhinitis, unspecified: Secondary | ICD-10-CM

## 2014-06-06 ENCOUNTER — Ambulatory Visit (INDEPENDENT_AMBULATORY_CARE_PROVIDER_SITE_OTHER): Payer: 59

## 2014-06-06 DIAGNOSIS — J309 Allergic rhinitis, unspecified: Secondary | ICD-10-CM

## 2014-06-12 ENCOUNTER — Ambulatory Visit (INDEPENDENT_AMBULATORY_CARE_PROVIDER_SITE_OTHER): Payer: 59

## 2014-06-12 DIAGNOSIS — J309 Allergic rhinitis, unspecified: Secondary | ICD-10-CM

## 2014-06-18 ENCOUNTER — Ambulatory Visit (INDEPENDENT_AMBULATORY_CARE_PROVIDER_SITE_OTHER): Payer: 59

## 2014-06-18 DIAGNOSIS — J309 Allergic rhinitis, unspecified: Secondary | ICD-10-CM

## 2014-06-19 ENCOUNTER — Ambulatory Visit (INDEPENDENT_AMBULATORY_CARE_PROVIDER_SITE_OTHER): Payer: 59

## 2014-06-19 DIAGNOSIS — J309 Allergic rhinitis, unspecified: Secondary | ICD-10-CM

## 2014-06-25 ENCOUNTER — Encounter: Payer: Self-pay | Admitting: Internal Medicine

## 2014-06-27 ENCOUNTER — Ambulatory Visit (INDEPENDENT_AMBULATORY_CARE_PROVIDER_SITE_OTHER): Payer: Self-pay

## 2014-06-27 DIAGNOSIS — J309 Allergic rhinitis, unspecified: Secondary | ICD-10-CM

## 2014-07-04 ENCOUNTER — Ambulatory Visit (INDEPENDENT_AMBULATORY_CARE_PROVIDER_SITE_OTHER): Payer: Self-pay

## 2014-07-04 DIAGNOSIS — J309 Allergic rhinitis, unspecified: Secondary | ICD-10-CM

## 2014-07-11 ENCOUNTER — Ambulatory Visit: Payer: Self-pay

## 2014-07-18 ENCOUNTER — Ambulatory Visit (INDEPENDENT_AMBULATORY_CARE_PROVIDER_SITE_OTHER): Payer: Self-pay

## 2014-07-18 DIAGNOSIS — J309 Allergic rhinitis, unspecified: Secondary | ICD-10-CM

## 2014-07-25 ENCOUNTER — Ambulatory Visit: Payer: Self-pay

## 2014-08-01 ENCOUNTER — Ambulatory Visit (INDEPENDENT_AMBULATORY_CARE_PROVIDER_SITE_OTHER): Payer: 59

## 2014-08-01 DIAGNOSIS — J309 Allergic rhinitis, unspecified: Secondary | ICD-10-CM

## 2014-08-05 ENCOUNTER — Telehealth: Payer: Self-pay | Admitting: *Deleted

## 2014-08-05 ENCOUNTER — Encounter: Payer: Self-pay | Admitting: Internal Medicine

## 2014-08-05 ENCOUNTER — Ambulatory Visit (INDEPENDENT_AMBULATORY_CARE_PROVIDER_SITE_OTHER): Payer: 59 | Admitting: Internal Medicine

## 2014-08-05 ENCOUNTER — Other Ambulatory Visit (INDEPENDENT_AMBULATORY_CARE_PROVIDER_SITE_OTHER): Payer: 59

## 2014-08-05 ENCOUNTER — Ambulatory Visit (INDEPENDENT_AMBULATORY_CARE_PROVIDER_SITE_OTHER)
Admission: RE | Admit: 2014-08-05 | Discharge: 2014-08-05 | Disposition: A | Discharge status: Home / Self Care | Payer: 59 | Source: Ambulatory Visit | Attending: Internal Medicine | Admitting: Internal Medicine

## 2014-08-05 VITALS — BP 100/64 | HR 103 | Ht 64.0 in | Wt 139.0 lb

## 2014-08-05 DIAGNOSIS — J449 Chronic obstructive pulmonary disease, unspecified: Secondary | ICD-10-CM

## 2014-08-05 DIAGNOSIS — J309 Allergic rhinitis, unspecified: Secondary | ICD-10-CM

## 2014-08-05 DIAGNOSIS — J3089 Other allergic rhinitis: Secondary | ICD-10-CM

## 2014-08-05 DIAGNOSIS — J302 Other seasonal allergic rhinitis: Secondary | ICD-10-CM

## 2014-08-05 LAB — CBC WITH DIFFERENTIAL/PLATELET
Basophils Absolute: 0.1 10*3/uL (ref 0.0–0.1)
Basophils Relative: 0.9 % (ref 0.0–3.0)
EOS PCT: 6.8 % — AB (ref 0.0–5.0)
Eosinophils Absolute: 0.5 10*3/uL (ref 0.0–0.7)
HCT: 40.3 % (ref 36.0–46.0)
Hemoglobin: 14.1 g/dL (ref 12.0–15.0)
Lymphocytes Relative: 27 % (ref 12.0–46.0)
Lymphs Abs: 2 10*3/uL (ref 0.7–4.0)
MCHC: 34.9 g/dL (ref 30.0–36.0)
MCV: 96.9 fl (ref 78.0–100.0)
MONO ABS: 0.6 10*3/uL (ref 0.1–1.0)
Monocytes Relative: 8 % (ref 3.0–12.0)
NEUTROS PCT: 57.3 % (ref 43.0–77.0)
Neutro Abs: 4.2 10*3/uL (ref 1.4–7.7)
PLATELETS: 272 10*3/uL (ref 150.0–400.0)
RBC: 4.16 Mil/uL (ref 3.87–5.11)
RDW: 12.2 % (ref 11.5–15.5)
WBC: 7.3 10*3/uL (ref 4.0–10.5)

## 2014-08-05 NOTE — Assessment & Plan Note (Signed)
Allergy vaccine 1:10 helping

## 2014-08-05 NOTE — Progress Notes (Signed)
03/30/11-59 year old female former smoker followed for asthma complicated by hypertension and history of 3 lung nodules followed by Dr. Cyndia Bent. PCP Dr Darcus Austin PFT 01/13/2003-FEV1 2.5 L/92%, FEV1/FVC 0.76 CT chest 08/11/2010 stable bilateral nodules without change since 2009. She reports doing well. Occasional wheeze is normal for her. She uses Advair once daily. This weekend noted increased cough, sneeze, wheeze. Peak flow dropped to 100. She started herself on prednisone 15 mg. 3 weeks ago she had been treated with antibiotics for diverticulitis(Dr. Collene Mares).  03/21/12- 59 year old female former smoker followed for asthma complicated by hypertension and history of 3 lung nodules followed by Dr. Cyndia Bent. PCP Dr Darcus Austin Increased SOB since last visit; wheezing, was recently given abx and pred; still coughing-yellow in color but not as thick now; Would like to get Tdap shot today. Gets flu vaccine at school Peak flow running around 200. A good day is 300. Office spirometry 03/21/2012-FEV1 1.50/59%, FVC 2.67/84%, FEV1/FVC 0.56 FEF 25-75% 0.54. Moderate obstructive airways disease. CXR 09/06/11 IMPRESSION:  No active lung disease. Small lung nodules noted on CT of the  chest are not visible on chest x-ray. Thoracolumbar scoliosis.  Original Report Authenticated By: Joretta Bachelor, M.D.   04/24/12- 59 year old female former smoker followed for asthma complicated by hypertension and history of 3 lung nodules followed by Dr. Cyndia Bent. PCP Dr Darcus Austin Denies any problems with her breathing. Cough has cleared up. Occasional palpitations, jaw pain, tingling right arm. Asks cardiology evaluation. Easy dyspnea on exertion lifting. Dizzy spells. Family history of heart disease. Took Xanax before coming today. Works 7 days per week, much stress, tired. Aware of heart burn. Continues Advair 500, Spiriva. Finishing prednisone taper. 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 Allergy profile 03/22/2012- total IgE 255.3 with significant elevations especially for dust mites, animal danders, grass and tree pollens EKG 04/24/12-sinus rhythm, NSSTTW changes.  05/23/12- 59 year old female former smoker followed for asthma complicated by hypertension and history of 3 lung nodules followed by Dr. Cyndia Bent. PCP Dr Darcus Austin No antihistamines, OTC cough syrups, or sleep meds in past 2 days  Sputum 03/23/12- Neg [employer], Nl [place] Hoarseness and raspy cough come and go- not different this Fall. She stopped aspirin she was taking daily for joint pains, and thinks her breathing might have improved. Discussed inhalers. She asks about re-trying theophylline- discussed. Wants to lose weight.  No change with fall weather. Frequent cough and hoarseness, usually not productive. Allergy skin test 05/23/12- strong puncture reactions for ragweed, tree pollens, dust mite, cat, dog, coarse. Also to beef pork and lamb. We discussed these reactions and I made distinction between environmental respiratory allergies and foods. She understands to avoid foods which cause symptoms but has not been aware of problems with any meats. PFT: 04/30/2012-moderate obstructive airways disease with response to bronchodilator, air trapping, diffusion mildly reduced. FEV1 1.92/81%, FEV1/FVC 0.59, RV 134%, DLCO 74%  07/05/12- 59 year old female former smoker followed for asthma complicated by hypertension and history of 3 lung nodules followed by Dr. Cyndia Bent. PCP Dr Darcus Austin FOLLOWS FOR: doing well with Dymista and Theophylline; has a tickle clearing throat alot right now. There've been a lot of colds going around her office. She continues theophylline which we discussed again. Alpha-galI IgE was normal, making allergy to mammalian meat unlikely  She asks about trying allergy vaccine again based on the new skin tests. CXR 09/06/11 IMPRESSION:  No active  lung  disease. Small lung nodules noted on CT of the  chest are not visible on chest x-ray. Thoracolumbar scoliosis.  Original Report Authenticated By: Joretta Bachelor, M.D.   09/04/12- 59 year old female former smoker followed for asthma complicated by hypertension and history of 3 lung nodules followed by Dr. Cyndia Bent. PCP Dr Darcus Austin FOLLOWS FOR: still having cough; insurance will not cover Advair -need new medication.  Coughing scant yellow, some shortness of breath, some frontal headache. Interested in [place]. PFTs did show significant reactive airways and she is allergy skin test positive. We will check IgE level.  10/11/11- 59 year old female former smoker followed for asthma/ COPD complicated by hypertension and history of 3 lung nodules followed by Dr. Cyndia Bent. PCP Dr Darcus Austin [facility] inhaler wasn't enough for control, also needed a prednisone taper. Finished prednisone yesterday and doing well today. We had a detailed discussion of Xolair including purpose, mechanism, risks, protocol. She is very interested. IgE 09/04/12- 217.4 CXR 09/05/12 IMPRESSION:  Hyperinflation configuration. No pulmonary edema, pneumonia, or  pleural effusion.  Original Report Authenticated By: Shanon Brow Call   11/08/2012 Acute OV  Complains of Severe cough, painful at times.  Has prod cough with light yellow mucus, hoarseness, some PND/sore throat, wheezing, chest tightness; currently taking omnicef 300mg  since 5.19.14.  hurt back this morning from coughing; in near-severe pain.  No hemoptysis , chest pain, edema, or orthopnea.  CXR last month with NAD.    04/11/13-  59 year old female former smoker followed for asthma/ COPD complicated by hypertension and history of 3 lung nodules followed by Dr. Cyndia Bent. PCP Dr Darcus Austin FOLLOWS FOR: cough-productive-yelllow(light) in color. Continues to do well with Xolair. Xolair 225/ 2 wks x 1 month so far Avoiding dairy and wheat products Daily cough productive white/yellow  thick mucus, better at the beach.  07/30/13- 59 year old female former smoker followed for asthma/ COPD complicated by hypertension and history of 3 lung nodules followed by Dr. Cyndia Bent. PCP Dr Darcus Austin FOLLOWS FOR: would like to discuss getting allergy vaccine vs Xolair(not affordable at this time) Wheezing more and using rescue inhaler more since last  Xolair shot one month ago. We discussed allergy vaccine as an option. Notices that she feels better at the beach and she feels better at home, compared with her work place. She says other workers also cough at work. She would like to explore working from home and asks support. Needs prior authorization for Dulera 100 although insurance asked her to change from Advair to [facility] within the past year.  01/30/14- 59 year old female former smoker followed for asthma/ COPD complicated by hypertension and history of 3 lung nodules followed by Dr. Cyndia Bent. PCP Dr Darcus Austin ACUTE VISIT: Increased cough, SOB and wheezing. Getting worse. No longer on Xolair/ too expensive,  and unsure if allergy vaccine 1:50 GH is truly helping. She only rarely notices some heartburn. Constant cough and chest tightness, white sputum, no fever or chest pain. Significant problem of chronic obstructive asthma. CXR 07/30/13 IMPRESSION:  No acute cardiopulmonary abnormality seen. Hyperexpansion of the  lungs is noted suggesting chronic obstructive pulmonary disease.  Electronically Signed  By: Sabino Dick M.D.  On: 07/30/2013 16:33  04/01/14- 59 year old female former smoker followed for asthma/ COPD complicated by hypertension and history of 3 lung nodules followed by Dr. Cyndia Bent, GERD.                   PCP Dr Darcus Austin FOLLOWS FOR: Still on allergy vaccine 1:50 GH and doing  well. Long course of prednisone during last flareup helped resolve her wheezing and she has been well since. Has had flu shot. Little rhinitis now using Astelin plus Flonase. We can advance allergy  vaccine to 1:10 [place] She "lost" samples of Anoro and Qvar 80 we gave last time but is interested in trying for comparison with Dulera.  08/05/14- 59 year old female former smoker followed for asthma/ COPD (quit Xolair-cost), complicated by hypertension and history of 3 lung nodules followed by Dr. Cyndia Bent, GERD.                   PCP Dr Darcus Austin FOLLOWS FOR: Pt still on allergy vaccine 1:10 GH and doing well. Never tried the samples of Anoro and Qvar we gave as alternative to compare with [facility]. Today needed to add albuterol to her morning Dulera, but otherwise doing well. Says she thinks allergy vaccine is helping. She suspects worse breathing yesterday because she had milk. Discussed potential candidacy for research studies here.  ROS- see HPI Constitutional:   No-   weight loss, night sweats, fevers, chills, fatigue, lassitude. HEENT:    headaches, no-difficulty swallowing, tooth/dental problems, sore throat,     No-sneezing, no- itching, ear ache, nasal congestion, post nasal drip,  CV:  No-chest pain, orthopnea, PND, swelling in lower extremities, anasarca, dizziness, +palpitations Resp: +  shortness of breath with exertion or at rest.             + productive cough,  + non-productive cough,  No-  coughing up of blood.             No-change in color of mucus.  +  wheezing.   Skin: No-   rash or lesions. GI:  No-  heartburn, indigestion, no-abdominal pain, no-nausea, vomiting,  GU:  MS:  No-   joint pain or swelling.   Neuro- nothing unusual Psych:  No- change in mood or affect. + depression or anxiety.  No memory loss.  OBJ General- Alert, Oriented, Affect-appropriate, Distress- none acute Skin- rash-none, lesions- none, excoriation- none Lymphadenopathy- none Head- atraumatic            Eyes- Gross vision intact, PERRLA, conjunctivae clear secretions            Ears- Hearing, canals            Nose- Clear, Septal dev, mucus, polyps, erosion, perforation             Throat-  Mallampati II , mucosa clear , drainage-none, tonsils- atrophic.  Neck- flexible , trachea midline, no stridor , thyroid nl, carotid no bruit Chest - symmetrical excursion , unlabored           Heart/CV- RRR , no murmur , no gallop  , no rub, nl s1 s2                           - JVD- none , edema- none, stasis changes- none, varices- none           Lung-   +distant breath sounds , dullness-none, rub- none, wheezing-none, cough-none           Chest wall-  Abd-  Br/ Gen/ Rectal- Not done, not indicated Extrem- cyanosis- none, clubbing, none, atrophy- none, strength- nl Neuro- grossly intact to observation

## 2014-08-05 NOTE — Patient Instructions (Signed)
Order- CXR                      dx asthma with COPD  Order- schedule PFT  Order- Lab- Allergy profile, Food IgE profile, CBC w diff  As an alternative to [facility] we had given samples of  Anoro- 1 puff, once daily Qvar- 2 puffs, then rinse mouth, twice daily  Ok to continue allergy vaccine 1:10 GH

## 2014-08-05 NOTE — Assessment & Plan Note (Signed)
She feels controlled for now, but did ask instructions again to try the samples of Anoro and Qvar 80 she already has- compare with [facility]. She might be a candidate for one of the local research drug studies, depending on how we can define asthma vs COPD. She used her bronchodilators this AM. Plan- CXR, Allergy profiles. CBC w diff for eosinophils, PFT

## 2014-08-06 LAB — ALLERGY FULL PROFILE
ALLERGEN, D PTERNOYSSINUS, D1: 1.08 kU/L — AB
Allergen,Goose feathers, e70: 0.21 kU/L — ABNORMAL HIGH
Alternaria Alternata: <0.1 kU/L
Aspergillus fumigatus, m3: <0.1 kU/L
BAHIA GRASS: 0.22 kU/L — AB
Bermuda Grass: 0.21 kU/L — ABNORMAL HIGH
Box Elder IgE: 0.28 kU/L — ABNORMAL HIGH
Candida Albicans: 0.18 kU/L — ABNORMAL HIGH
Cat Dander: 32.7 kU/L — ABNORMAL HIGH
Common Ragweed: 0.75 kU/L — ABNORMAL HIGH
Curvularia lunata: <0.1 kU/L
D. farinae: 1.21 kU/L — ABNORMAL HIGH
Dog Dander: 22.7 kU/L — ABNORMAL HIGH
ELM IGE: 0.31 kU/L — AB
FESCUE: 0.18 kU/L — AB
G005 RYE, PERENNIAL: 0.16 kU/L — AB
G009 Red Top: 0.16 kU/L — ABNORMAL HIGH
Goldenrod: 0.21 kU/L — ABNORMAL HIGH
HOUSE DUST HOLLISTER: 9.07 kU/L — AB
Helminthosporium halodes: <0.1 kU/L
LAMB'S QUARTERS CLASS: 0.25 kU/L — AB
Oak: 0.25 kU/L — ABNORMAL HIGH
Plantain: 0.17 kU/L — ABNORMAL HIGH
Stemphylium Botryosum: <0.1 kU/L
Sycamore Tree: 0.21 kU/L — ABNORMAL HIGH
TIMOTHY GRASS: 0.14 kU/L — AB

## 2014-08-06 LAB — ALLERGEN FOOD PROFILE SPECIFIC IGE
Apple: 0.17 kU/L — ABNORMAL HIGH
Chicken IgE: <0.1 kU/L
Corn: 0.17 kU/L — ABNORMAL HIGH
Egg White IgE: 0.32 kU/L — ABNORMAL HIGH
IGE (IMMUNOGLOBULIN E), SERUM: 243 kU/L — AB (ref <115)
Milk IgE: 0.89 kU/L — ABNORMAL HIGH
Orange: 0.13 kU/L — ABNORMAL HIGH
PEANUT IGE: 0.21 kU/L — AB
SOYBEAN IGE: 0.18 kU/L — AB
Shrimp IgE: <0.1 kU/L
Tomato IgE: 0.27 kU/L — ABNORMAL HIGH
Wheat IgE: 0.48 kU/L — ABNORMAL HIGH

## 2014-08-06 NOTE — Telephone Encounter (Signed)
error 

## 2014-08-06 NOTE — Progress Notes (Signed)
Quick Note:  lmtcb ______ 

## 2014-08-08 ENCOUNTER — Ambulatory Visit (INDEPENDENT_AMBULATORY_CARE_PROVIDER_SITE_OTHER): Payer: 59

## 2014-08-08 DIAGNOSIS — J309 Allergic rhinitis, unspecified: Secondary | ICD-10-CM

## 2014-08-15 ENCOUNTER — Ambulatory Visit (INDEPENDENT_AMBULATORY_CARE_PROVIDER_SITE_OTHER): Payer: 59

## 2014-08-15 DIAGNOSIS — J309 Allergic rhinitis, unspecified: Secondary | ICD-10-CM

## 2014-08-22 ENCOUNTER — Ambulatory Visit (INDEPENDENT_AMBULATORY_CARE_PROVIDER_SITE_OTHER): Payer: 59

## 2014-08-22 DIAGNOSIS — J309 Allergic rhinitis, unspecified: Secondary | ICD-10-CM

## 2014-08-29 ENCOUNTER — Ambulatory Visit: Payer: 59

## 2014-09-05 ENCOUNTER — Ambulatory Visit (INDEPENDENT_AMBULATORY_CARE_PROVIDER_SITE_OTHER): Payer: 59

## 2014-09-05 DIAGNOSIS — J309 Allergic rhinitis, unspecified: Secondary | ICD-10-CM

## 2014-09-11 ENCOUNTER — Ambulatory Visit (INDEPENDENT_AMBULATORY_CARE_PROVIDER_SITE_OTHER): Payer: 59

## 2014-09-11 DIAGNOSIS — J309 Allergic rhinitis, unspecified: Secondary | ICD-10-CM | POA: Diagnosis not present

## 2014-09-19 ENCOUNTER — Ambulatory Visit (INDEPENDENT_AMBULATORY_CARE_PROVIDER_SITE_OTHER): Payer: 59

## 2014-09-19 DIAGNOSIS — J309 Allergic rhinitis, unspecified: Secondary | ICD-10-CM

## 2014-09-26 ENCOUNTER — Ambulatory Visit (INDEPENDENT_AMBULATORY_CARE_PROVIDER_SITE_OTHER): Payer: 59

## 2014-09-26 DIAGNOSIS — J309 Allergic rhinitis, unspecified: Secondary | ICD-10-CM

## 2014-10-03 ENCOUNTER — Ambulatory Visit: Payer: 59

## 2014-10-10 ENCOUNTER — Ambulatory Visit (INDEPENDENT_AMBULATORY_CARE_PROVIDER_SITE_OTHER): Payer: 59

## 2014-10-10 DIAGNOSIS — J309 Allergic rhinitis, unspecified: Secondary | ICD-10-CM | POA: Diagnosis not present

## 2014-10-17 ENCOUNTER — Ambulatory Visit (INDEPENDENT_AMBULATORY_CARE_PROVIDER_SITE_OTHER): Payer: 59

## 2014-10-17 DIAGNOSIS — J309 Allergic rhinitis, unspecified: Secondary | ICD-10-CM | POA: Diagnosis not present

## 2014-10-23 ENCOUNTER — Ambulatory Visit (INDEPENDENT_AMBULATORY_CARE_PROVIDER_SITE_OTHER): Payer: 59

## 2014-10-23 DIAGNOSIS — J309 Allergic rhinitis, unspecified: Secondary | ICD-10-CM | POA: Diagnosis not present

## 2014-10-24 ENCOUNTER — Ambulatory Visit: Payer: 59

## 2014-10-31 ENCOUNTER — Ambulatory Visit (INDEPENDENT_AMBULATORY_CARE_PROVIDER_SITE_OTHER): Payer: 59

## 2014-10-31 DIAGNOSIS — J309 Allergic rhinitis, unspecified: Secondary | ICD-10-CM

## 2014-11-03 ENCOUNTER — Encounter: Payer: Self-pay | Admitting: Internal Medicine

## 2014-11-03 ENCOUNTER — Ambulatory Visit (INDEPENDENT_AMBULATORY_CARE_PROVIDER_SITE_OTHER): Payer: 59 | Admitting: Internal Medicine

## 2014-11-03 VITALS — BP 100/60 | HR 75 | Ht 62.5 in | Wt 147.0 lb

## 2014-11-03 DIAGNOSIS — J309 Allergic rhinitis, unspecified: Secondary | ICD-10-CM | POA: Diagnosis not present

## 2014-11-03 DIAGNOSIS — J449 Chronic obstructive pulmonary disease, unspecified: Secondary | ICD-10-CM

## 2014-11-03 DIAGNOSIS — Z91018 Allergy to other foods: Secondary | ICD-10-CM | POA: Diagnosis not present

## 2014-11-03 DIAGNOSIS — J3089 Other allergic rhinitis: Secondary | ICD-10-CM

## 2014-11-03 DIAGNOSIS — J302 Other seasonal allergic rhinitis: Secondary | ICD-10-CM

## 2014-11-03 LAB — PULMONARY FUNCTION TEST
DL/VA % pred: 105 %
DL/VA: 4.86 ml/min/mmHg/L
DLCO unc % pred: 93 %
DLCO unc: 20.82 ml/min/mmHg
FEF 25-75 Post: 0.98 L/s
FEF 25-75 Pre: 0.62 L/s
FEF2575-%Change-Post: 58 %
FEF2575-%Pred-Post: 42 %
FEF2575-%Pred-Pre: 26 %
FEV1-%Change-Post: 14 %
FEV1-%Pred-Post: 64 %
FEV1-%Pred-Pre: 56 %
FEV1-Post: 1.58 L
FEV1-Pre: 1.38 L
FEV1FVC-%Change-Post: 2 %
FEV1FVC-%Pred-Pre: 74 %
FEV6-%Change-Post: 13 %
FEV6-%Pred-Post: 86 %
FEV6-%Pred-Pre: 76 %
FEV6-Post: 2.62 L
FEV6-Pre: 2.32 L
FEV6FVC-%Change-Post: 1 %
FEV6FVC-%Pred-Post: 103 %
FEV6FVC-%Pred-Pre: 101 %
FVC-%Change-Post: 11 %
FVC-%Pred-Post: 84 %
FVC-%Pred-Pre: 75 %
FVC-Post: 2.65 L
FVC-Pre: 2.37 L
Post FEV1/FVC ratio: 59 %
Post FEV6/FVC ratio: 99 %
Pre FEV1/FVC ratio: 58 %
Pre FEV6/FVC Ratio: 98 %
RV % pred: 169 %
RV: 3.21 L
TLC % pred: 118 %
TLC: 5.74 L

## 2014-11-03 NOTE — Progress Notes (Signed)
03/30/11-59 year old female former smoker followed for asthma complicated by hypertension and history of 3 lung nodules followed by Dr. Cyndia Bent. PCP Dr Darcus Austin PFT 01/13/2003-FEV1 2.5 L/92%, FEV1/FVC 0.76 CT chest 08/11/2010 stable bilateral nodules without change since 2009. She reports doing well. Occasional wheeze is normal for her. She uses Advair once daily. This weekend noted increased cough, sneeze, wheeze. Peak flow dropped to 100. She started herself on prednisone 15 mg. 3 weeks ago she had been treated with antibiotics for diverticulitis(Dr. Collene Mares).  03/21/12- 59 year old female former smoker followed for asthma complicated by hypertension and history of 3 lung nodules followed by Dr. Cyndia Bent. PCP Dr Darcus Austin Increased SOB since last visit; wheezing, was recently given abx and pred; still coughing-yellow in color but not as thick now; Would like to get Tdap shot today. Gets flu vaccine at school Peak flow running around 200. A good day is 300. Office spirometry 03/21/2012-FEV1 1.50/59%, FVC 2.67/84%, FEV1/FVC 0.56 FEF 25-75% 0.54. Moderate obstructive airways disease. CXR 09/06/11 IMPRESSION:  No active lung disease. Small lung nodules noted on CT of the  chest are not visible on chest x-ray. Thoracolumbar scoliosis.  Original Report Authenticated By: Joretta Bachelor, M.D.   04/24/12- 59 year old female former smoker followed for asthma complicated by hypertension and history of 3 lung nodules followed by Dr. Cyndia Bent. PCP Dr Darcus Austin Denies any problems with her breathing. Cough has cleared up. Occasional palpitations, jaw pain, tingling right arm. Asks cardiology evaluation. Easy dyspnea on exertion lifting. Dizzy spells. Family history of heart disease. Took Xanax before coming today. Works 7 days per week, much stress, tired. Aware of heart burn. Continues Advair 500, Spiriva. Finishing prednisone taper. 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 Allergy profile 03/22/2012- total IgE 255.3 with significant elevations especially for dust mites, animal danders, grass and tree pollens EKG 04/24/12-sinus rhythm, NSSTTW changes.  05/23/12- 59 year old female former smoker followed for asthma complicated by hypertension and history of 3 lung nodules followed by Dr. Cyndia Bent. PCP Dr Darcus Austin No antihistamines, OTC cough syrups, or sleep meds in past 2 days  Sputum 03/23/12- Neg [employer], Nl [place] Hoarseness and raspy cough come and go- not different this Fall. She stopped aspirin she was taking daily for joint pains, and thinks her breathing might have improved. Discussed inhalers. She asks about re-trying theophylline- discussed. Wants to lose weight.  No change with fall weather. Frequent cough and hoarseness, usually not productive. Allergy skin test 05/23/12- strong puncture reactions for ragweed, tree pollens, dust mite, cat, dog, coarse. Also to beef pork and lamb. We discussed these reactions and I made distinction between environmental respiratory allergies and foods. She understands to avoid foods which cause symptoms but has not been aware of problems with any meats. PFT: 04/30/2012-moderate obstructive airways disease with response to bronchodilator, air trapping, diffusion mildly reduced. FEV1 1.92/81%, FEV1/FVC 0.59, RV 134%, DLCO 74%  07/05/12- 59 year old female former smoker followed for asthma complicated by hypertension and history of 3 lung nodules followed by Dr. Cyndia Bent. PCP Dr Darcus Austin FOLLOWS FOR: doing well with Dymista and Theophylline; has a tickle clearing throat alot right now. There've been a lot of colds going around her office. She continues theophylline which we discussed again. Alpha-galI IgE was normal, making allergy to mammalian meat unlikely  She asks about trying allergy vaccine again based on the new skin tests. CXR 09/06/11 IMPRESSION:  No active  lung  disease. Small lung nodules noted on CT of the  chest are not visible on chest x-ray. Thoracolumbar scoliosis.  Original Report Authenticated By: Joretta Bachelor, M.D.   09/04/12- 59 year old female former smoker followed for asthma complicated by hypertension and history of 3 lung nodules followed by Dr. Cyndia Bent. PCP Dr Darcus Austin FOLLOWS FOR: still having cough; insurance will not cover Advair -need new medication.  Coughing scant yellow, some shortness of breath, some frontal headache. Interested in [place]. PFTs did show significant reactive airways and she is allergy skin test positive. We will check IgE level.  10/11/11- 59 year old female former smoker followed for asthma/ COPD complicated by hypertension and history of 3 lung nodules followed by Dr. Cyndia Bent. PCP Dr Darcus Austin [facility] inhaler wasn't enough for control, also needed a prednisone taper. Finished prednisone yesterday and doing well today. We had a detailed discussion of Xolair including purpose, mechanism, risks, protocol. She is very interested. IgE 09/04/12- 217.4 CXR 09/05/12 IMPRESSION:  Hyperinflation configuration. No pulmonary edema, pneumonia, or  pleural effusion.  Original Report Authenticated By: Shanon Brow Call   11/08/2012 Acute OV  Complains of Severe cough, painful at times.  Has prod cough with light yellow mucus, hoarseness, some PND/sore throat, wheezing, chest tightness; currently taking omnicef 300mg  since 5.19.14.  hurt back this morning from coughing; in near-severe pain.  No hemoptysis , chest pain, edema, or orthopnea.  CXR last month with NAD.    04/11/13-  59 year old female former smoker followed for asthma/ COPD complicated by hypertension and history of 3 lung nodules followed by Dr. Cyndia Bent. PCP Dr Darcus Austin FOLLOWS FOR: cough-productive-yelllow(light) in color. Continues to do well with Xolair. Xolair 225/ 2 wks x 1 month so far Avoiding dairy and wheat products Daily cough productive white/yellow  thick mucus, better at the beach.  07/30/13- 59 year old female former smoker followed for asthma/ COPD complicated by hypertension and history of 3 lung nodules followed by Dr. Cyndia Bent. PCP Dr Darcus Austin FOLLOWS FOR: would like to discuss getting allergy vaccine vs Xolair(not affordable at this time) Wheezing more and using rescue inhaler more since last  Xolair shot one month ago. We discussed allergy vaccine as an option. Notices that she feels better at the beach and she feels better at home, compared with her work place. She says other workers also cough at work. She would like to explore working from home and asks support. Needs prior authorization for Dulera 100 although insurance asked her to change from Advair to [facility] within the past year.  01/30/14- 59 year old female former smoker followed for asthma/ COPD complicated by hypertension and history of 3 lung nodules followed by Dr. Cyndia Bent. PCP Dr Darcus Austin ACUTE VISIT: Increased cough, SOB and wheezing. Getting worse. No longer on Xolair/ too expensive,  and unsure if allergy vaccine 1:50 GH is truly helping. She only rarely notices some heartburn. Constant cough and chest tightness, white sputum, no fever or chest pain. Significant problem of chronic obstructive asthma. CXR 07/30/13 IMPRESSION:  No acute cardiopulmonary abnormality seen. Hyperexpansion of the  lungs is noted suggesting chronic obstructive pulmonary disease.  Electronically Signed  By: Sabino Dick M.D.  On: 07/30/2013 16:33  04/01/14- 59 year old female former smoker followed for asthma/ COPD complicated by hypertension and history of 3 lung nodules followed by Dr. Cyndia Bent, GERD.                   PCP Dr Darcus Austin FOLLOWS FOR: Still on allergy vaccine 1:50 GH and doing  well. Long course of prednisone during last flareup helped resolve her wheezing and she has been well since. Has had flu shot. Little rhinitis now using Astelin plus Flonase. We can advance allergy  vaccine to 1:10 [place] She "lost" samples of Anoro and Qvar 80 we gave last time but is interested in trying for comparison with Dulera.  08/05/14- 59 year old female former smoker followed for asthma/ COPD (quit Xolair-cost), complicated by hypertension and history of 3 lung nodules followed by Dr. Cyndia Bent, GERD.                   PCP Dr Darcus Austin FOLLOWS FOR: Pt still on allergy vaccine 1:10 GH and doing well. Never tried the samples of Anoro and Qvar we gave as alternative to compare with [facility]. Today needed to add albuterol to her morning Dulera, but otherwise doing well. Says she thinks allergy vaccine is helping. She suspects worse breathing yesterday because she had milk. Discussed potential candidacy for research studies here.  11/03/14- 59 year old female former smoker followed for asthma/ COPD (quit Xolair-cost), complicated by hypertension and history of 3 lung nodules followed by Dr. Cyndia Bent, GERD.                   PCP Dr Darcus Austin Allergy vaccine 1:10 [place] PFT 11/03/14-moderately severe obstructive airways disease with air trapping, normal diffusion capacity, slight response to bronchodilator. FEV1 1.58/64%, FEV1/FVC 0.59, RV 169%, DLCO 93%. Allergy labs 08/05/14- broadly positive mites, cat, dog, grass, tree, weed pollens Total IgE 243, elevated for many foods Eosinophils 6.8% She is convinced now that allergy vaccine is definitely helping her. She had failed to benefit from Xolair after 6 months and too expensive CXR 08/05/14 IMPRESSION: 1. Possible bronchitis. No definite pneumonia. 2. Thoracolumbar scoliosis with degenerative change. Electronically Signed  By: Ivar Drape M.D.  On: 08/05/2014 10:10   ROS- see HPI Constitutional:   No-   weight loss, night sweats, fevers, chills, fatigue, lassitude. HEENT:    headaches, no-difficulty swallowing, tooth/dental problems, sore throat,     No-sneezing, no- itching, ear ache, nasal congestion, post nasal drip,  CV:  No-chest pain,  orthopnea, PND, swelling in lower extremities, anasarca, dizziness, +palpitations Resp: +  shortness of breath with exertion or at rest.             + productive cough,  + non-productive cough,  No-  coughing up of blood.             No-change in color of mucus.  +  wheezing.   Skin: No-   rash or lesions. GI:  No-  heartburn, indigestion, no-abdominal pain, no-nausea, vomiting,  GU:  MS:  No-   joint pain or swelling.   Neuro- nothing unusual Psych:  No- change in mood or affect. + depression or anxiety.  No memory loss.  OBJ General- Alert, Oriented, Affect-appropriate, Distress- none acute Skin- + dry eczematoid hands. She has painted cracks with                Mercurochrome Lymphadenopathy- none Head- atraumatic            Eyes- Gross vision intact, PERRLA, conjunctivae clear secretions            Ears- Hearing, canals            Nose- Clear, Septal dev, mucus, polyps, erosion, perforation             Throat- Mallampati II , mucosa clear , drainage-none, tonsils- atrophic.  Neck- flexible , trachea midline, no stridor , thyroid nl, carotid no bruit Chest - symmetrical excursion , unlabored           Heart/CV- RRR , no murmur , no gallop  , no rub, nl s1 s2                           - JVD- none , edema- none, stasis changes- none, varices- none           Lung-   +distant breath sounds , dullness-none, rub- none, wheezing-none, cough-none           Chest wall-  Abd-  Br/ Gen/ Rectal- Not done, not indicated Extrem- cyanosis- none, clubbing, none, atrophy- none, strength- nl Neuro- grossly intact to observation

## 2014-11-03 NOTE — Progress Notes (Signed)
PFT done today. 

## 2014-11-03 NOTE — Patient Instructions (Signed)
I will discuss your l,ab results with our research lab to see if any study protocols would be of interest.   Ok to add the Qvar samples you already have, when you feel ready  Please call as needed

## 2014-11-07 ENCOUNTER — Ambulatory Visit (INDEPENDENT_AMBULATORY_CARE_PROVIDER_SITE_OTHER): Payer: 59

## 2014-11-07 DIAGNOSIS — J309 Allergic rhinitis, unspecified: Secondary | ICD-10-CM

## 2014-11-13 ENCOUNTER — Ambulatory Visit: Payer: 59

## 2014-11-14 ENCOUNTER — Ambulatory Visit (INDEPENDENT_AMBULATORY_CARE_PROVIDER_SITE_OTHER): Payer: 59

## 2014-11-14 ENCOUNTER — Telehealth: Payer: Self-pay | Admitting: Internal Medicine

## 2014-11-14 DIAGNOSIS — J309 Allergic rhinitis, unspecified: Secondary | ICD-10-CM

## 2014-11-14 NOTE — Telephone Encounter (Signed)
Mrs. Darr came in for her shot today to tell me she had a softball size reaction last wk.. She gave [place] no documentation of this until today. I told her today as we have many times before if you start itching take benadryl,if the itching gets too bad use benadryl creme or Cortaid creme and if it's feverish use a cold compress. She didn't do any of those things. I took her back to 0.1 of 1:10 to be on the safe side and wrote a note to build as tolerated. You nor Joellen Jersey were here so I did what you have done in the past. Please advise.

## 2014-11-21 ENCOUNTER — Ambulatory Visit (INDEPENDENT_AMBULATORY_CARE_PROVIDER_SITE_OTHER): Payer: 59

## 2014-11-21 DIAGNOSIS — J309 Allergic rhinitis, unspecified: Secondary | ICD-10-CM

## 2014-11-23 NOTE — Assessment & Plan Note (Signed)
There is a significant allergic reactive component but also was significant fixed obstructive airways component Plan-continue Dulera with discussion. We can increase the strength to 200 or add Qvar. Consider Nucala

## 2014-11-23 NOTE — Assessment & Plan Note (Signed)
Continue allergy vaccine 1:10 GH

## 2014-11-23 NOTE — Assessment & Plan Note (Signed)
Avoiding foods that seem to trigger symptoms. Currently she is avoiding dairy products which she thinks increased mucus in her throat.

## 2014-11-25 NOTE — Telephone Encounter (Signed)
Per Allergy protocol this was the correct steps to take and build as tolerated. Please make sure patient has current Epipen and understands when and how to use it.

## 2014-11-25 NOTE — Telephone Encounter (Signed)
Thanks Done  

## 2014-12-05 ENCOUNTER — Ambulatory Visit (INDEPENDENT_AMBULATORY_CARE_PROVIDER_SITE_OTHER): Payer: 59

## 2014-12-05 DIAGNOSIS — J309 Allergic rhinitis, unspecified: Secondary | ICD-10-CM

## 2014-12-12 ENCOUNTER — Ambulatory Visit (INDEPENDENT_AMBULATORY_CARE_PROVIDER_SITE_OTHER): Payer: 59

## 2014-12-12 DIAGNOSIS — J309 Allergic rhinitis, unspecified: Secondary | ICD-10-CM

## 2014-12-16 ENCOUNTER — Telehealth: Payer: Self-pay | Admitting: Internal Medicine

## 2014-12-16 NOTE — Telephone Encounter (Signed)
Date Mixed: 12/16/2014 Vial: AB Strength: 1:10 Here/Mail/Pick Up: Here Mixed By: Desmond Dike, CMA

## 2014-12-17 ENCOUNTER — Ambulatory Visit (INDEPENDENT_AMBULATORY_CARE_PROVIDER_SITE_OTHER): Payer: 59

## 2014-12-17 DIAGNOSIS — J309 Allergic rhinitis, unspecified: Secondary | ICD-10-CM

## 2014-12-19 ENCOUNTER — Ambulatory Visit (INDEPENDENT_AMBULATORY_CARE_PROVIDER_SITE_OTHER): Payer: 59

## 2014-12-19 DIAGNOSIS — J309 Allergic rhinitis, unspecified: Secondary | ICD-10-CM

## 2014-12-26 ENCOUNTER — Ambulatory Visit (INDEPENDENT_AMBULATORY_CARE_PROVIDER_SITE_OTHER): Payer: 59

## 2014-12-26 DIAGNOSIS — J309 Allergic rhinitis, unspecified: Secondary | ICD-10-CM | POA: Diagnosis not present

## 2014-12-30 ENCOUNTER — Encounter: Payer: Self-pay | Admitting: Internal Medicine

## 2015-01-02 ENCOUNTER — Ambulatory Visit (INDEPENDENT_AMBULATORY_CARE_PROVIDER_SITE_OTHER): Payer: 59

## 2015-01-02 DIAGNOSIS — J309 Allergic rhinitis, unspecified: Secondary | ICD-10-CM

## 2015-01-09 ENCOUNTER — Ambulatory Visit (INDEPENDENT_AMBULATORY_CARE_PROVIDER_SITE_OTHER): Payer: 59

## 2015-01-09 DIAGNOSIS — J309 Allergic rhinitis, unspecified: Secondary | ICD-10-CM | POA: Diagnosis not present

## 2015-01-10 ENCOUNTER — Other Ambulatory Visit: Payer: Self-pay | Admitting: Internal Medicine

## 2015-01-16 ENCOUNTER — Ambulatory Visit (INDEPENDENT_AMBULATORY_CARE_PROVIDER_SITE_OTHER): Payer: 59

## 2015-01-16 DIAGNOSIS — J309 Allergic rhinitis, unspecified: Secondary | ICD-10-CM | POA: Diagnosis not present

## 2015-01-23 ENCOUNTER — Ambulatory Visit (INDEPENDENT_AMBULATORY_CARE_PROVIDER_SITE_OTHER): Payer: 59

## 2015-01-23 DIAGNOSIS — J309 Allergic rhinitis, unspecified: Secondary | ICD-10-CM | POA: Diagnosis not present

## 2015-01-28 ENCOUNTER — Other Ambulatory Visit: Payer: Self-pay | Admitting: Internal Medicine

## 2015-01-28 ENCOUNTER — Telehealth: Payer: Self-pay | Admitting: Internal Medicine

## 2015-01-28 MED ORDER — AZELASTINE HCL 0.1 % NA SOLN: NASAL | Status: DC

## 2015-01-28 NOTE — Telephone Encounter (Signed)
Received refill request from [facility] to refill pt's Azelastine Nasal Spray Per CY, ok to refill with 4 additional refills [facility] Nothing further is needed

## 2015-01-28 NOTE — Telephone Encounter (Signed)
Spoke with Coralyn Mark at [facility]. They had questions about the Astelin prescription. The entire sig was not on the prescription when it was sent. I have verified this with her. Nothing further was needed at this time.

## 2015-01-30 ENCOUNTER — Ambulatory Visit (INDEPENDENT_AMBULATORY_CARE_PROVIDER_SITE_OTHER): Payer: 59

## 2015-01-30 DIAGNOSIS — J309 Allergic rhinitis, unspecified: Secondary | ICD-10-CM | POA: Diagnosis not present

## 2015-02-03 ENCOUNTER — Encounter: Payer: Self-pay | Admitting: Internal Medicine

## 2015-02-03 ENCOUNTER — Ambulatory Visit (INDEPENDENT_AMBULATORY_CARE_PROVIDER_SITE_OTHER): Payer: 59 | Admitting: Internal Medicine

## 2015-02-03 VITALS — BP 112/74 | HR 74 | Ht 62.5 in | Wt 145.8 lb

## 2015-02-03 DIAGNOSIS — J309 Allergic rhinitis, unspecified: Secondary | ICD-10-CM | POA: Diagnosis not present

## 2015-02-03 DIAGNOSIS — J3089 Other allergic rhinitis: Principal | ICD-10-CM

## 2015-02-03 DIAGNOSIS — J302 Other seasonal allergic rhinitis: Secondary | ICD-10-CM

## 2015-02-03 DIAGNOSIS — J449 Chronic obstructive pulmonary disease, unspecified: Secondary | ICD-10-CM | POA: Diagnosis not present

## 2015-02-03 NOTE — Patient Instructions (Signed)
We can continue current meds  Please call as needed 

## 2015-02-03 NOTE — Progress Notes (Signed)
03/30/11-59 year old female former smoker followed for asthma complicated by hypertension and history of 3 lung nodules followed by Dr. Cyndia Bent. PCP Dr Darcus Austin PFT 01/13/2003-FEV1 2.5 L/92%, FEV1/FVC 0.76 CT chest 08/11/2010 stable bilateral nodules without change since 2009. She reports doing well. Occasional wheeze is normal for her. She uses Advair once daily. This weekend noted increased cough, sneeze, wheeze. Peak flow dropped to 100. She started herself on prednisone 15 mg. 3 weeks ago she had been treated with antibiotics for diverticulitis(Dr. Collene Mares).  03/21/12- 59 year old female former smoker followed for asthma complicated by hypertension and history of 3 lung nodules followed by Dr. Cyndia Bent. PCP Dr Darcus Austin Increased SOB since last visit; wheezing, was recently given abx and pred; still coughing-yellow in color but not as thick now; Would like to get Tdap shot today. Gets flu vaccine at school Peak flow running around 200. A good day is 300. Office spirometry 03/21/2012-FEV1 1.50/59%, FVC 2.67/84%, FEV1/FVC 0.56 FEF 25-75% 0.54. Moderate obstructive airways disease. CXR 09/06/11 IMPRESSION:  No active lung disease. Small lung nodules noted on CT of the  chest are not visible on chest x-ray. Thoracolumbar scoliosis.  Original Report Authenticated By: Joretta Bachelor, M.D.   04/24/12- 59 year old female former smoker followed for asthma complicated by hypertension and history of 3 lung nodules followed by Dr. Cyndia Bent. PCP Dr Darcus Austin Denies any problems with her breathing. Cough has cleared up. Occasional palpitations, jaw pain, tingling right arm. Asks cardiology evaluation. Easy dyspnea on exertion lifting. Dizzy spells. Family history of heart disease. Took Xanax before coming today. Works 7 days per week, much stress, tired. Aware of heart burn. Continues Advair 500, Spiriva. Finishing prednisone taper. 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 Allergy profile 03/22/2012- total IgE 255.3 with significant elevations especially for dust mites, animal danders, grass and tree pollens EKG 04/24/12-sinus rhythm, NSSTTW changes.  05/23/12- 59 year old female former smoker followed for asthma complicated by hypertension and history of 3 lung nodules followed by Dr. Cyndia Bent. PCP Dr Darcus Austin No antihistamines, OTC cough syrups, or sleep meds in past 2 days  Sputum 03/23/12- Neg [employer], Nl [place] Hoarseness and raspy cough come and go- not different this Fall. She stopped aspirin she was taking daily for joint pains, and thinks her breathing might have improved. Discussed inhalers. She asks about re-trying theophylline- discussed. Wants to lose weight.  No change with fall weather. Frequent cough and hoarseness, usually not productive. Allergy skin test 05/23/12- strong puncture reactions for ragweed, tree pollens, dust mite, cat, dog, coarse. Also to beef pork and lamb. We discussed these reactions and I made distinction between environmental respiratory allergies and foods. She understands to avoid foods which cause symptoms but has not been aware of problems with any meats. PFT: 04/30/2012-moderate obstructive airways disease with response to bronchodilator, air trapping, diffusion mildly reduced. FEV1 1.92/81%, FEV1/FVC 0.59, RV 134%, DLCO 74%  07/05/12- 59 year old female former smoker followed for asthma complicated by hypertension and history of 3 lung nodules followed by Dr. Cyndia Bent. PCP Dr Darcus Austin FOLLOWS FOR: doing well with Dymista and Theophylline; has a tickle clearing throat alot right now. There've been a lot of colds going around her office. She continues theophylline which we discussed again. Alpha-galI IgE was normal, making allergy to mammalian meat unlikely  She asks about trying allergy vaccine again based on the new skin tests. CXR 09/06/11 IMPRESSION:  No active  lung  disease. Small lung nodules noted on CT of the  chest are not visible on chest x-ray. Thoracolumbar scoliosis.  Original Report Authenticated By: Joretta Bachelor, M.D.   09/04/12- 59 year old female former smoker followed for asthma complicated by hypertension and history of 3 lung nodules followed by Dr. Cyndia Bent. PCP Dr Darcus Austin FOLLOWS FOR: still having cough; insurance will not cover Advair -need new medication.  Coughing scant yellow, some shortness of breath, some frontal headache. Interested in [place]. PFTs did show significant reactive airways and she is allergy skin test positive. We will check IgE level.  10/11/11- 59 year old female former smoker followed for asthma/ COPD complicated by hypertension and history of 3 lung nodules followed by Dr. Cyndia Bent. PCP Dr Darcus Austin [facility] inhaler wasn't enough for control, also needed a prednisone taper. Finished prednisone yesterday and doing well today. We had a detailed discussion of Xolair including purpose, mechanism, risks, protocol. She is very interested. IgE 09/04/12- 217.4 CXR 09/05/12 IMPRESSION:  Hyperinflation configuration. No pulmonary edema, pneumonia, or  pleural effusion.  Original Report Authenticated By: Shanon Brow Call   11/08/2012 Acute OV  Complains of Severe cough, painful at times.  Has prod cough with light yellow mucus, hoarseness, some PND/sore throat, wheezing, chest tightness; currently taking omnicef 300mg  since 5.19.14.  hurt back this morning from coughing; in near-severe pain.  No hemoptysis , chest pain, edema, or orthopnea.  CXR last month with NAD.    04/11/13-  59 year old female former smoker followed for asthma/ COPD complicated by hypertension and history of 3 lung nodules followed by Dr. Cyndia Bent. PCP Dr Darcus Austin FOLLOWS FOR: cough-productive-yelllow(light) in color. Continues to do well with Xolair. Xolair 225/ 2 wks x 1 month so far Avoiding dairy and wheat products Daily cough productive white/yellow  thick mucus, better at the beach.  07/30/13- 59 year old female former smoker followed for asthma/ COPD complicated by hypertension and history of 3 lung nodules followed by Dr. Cyndia Bent. PCP Dr Darcus Austin FOLLOWS FOR: would like to discuss getting allergy vaccine vs Xolair(not affordable at this time) Wheezing more and using rescue inhaler more since last  Xolair shot one month ago. We discussed allergy vaccine as an option. Notices that she feels better at the beach and she feels better at home, compared with her work place. She says other workers also cough at work. She would like to explore working from home and asks support. Needs prior authorization for Dulera 100 although insurance asked her to change from Advair to [facility] within the past year.  01/30/14- 59 year old female former smoker followed for asthma/ COPD complicated by hypertension and history of 3 lung nodules followed by Dr. Cyndia Bent. PCP Dr Darcus Austin ACUTE VISIT: Increased cough, SOB and wheezing. Getting worse. No longer on Xolair/ too expensive,  and unsure if allergy vaccine 1:50 GH is truly helping. She only rarely notices some heartburn. Constant cough and chest tightness, white sputum, no fever or chest pain. Significant problem of chronic obstructive asthma. CXR 07/30/13 IMPRESSION:  No acute cardiopulmonary abnormality seen. Hyperexpansion of the  lungs is noted suggesting chronic obstructive pulmonary disease.  Electronically Signed  By: Sabino Dick M.D.  On: 07/30/2013 16:33  04/01/14- 60 year old female former smoker followed for asthma/ COPD complicated by hypertension and history of 3 lung nodules followed by Dr. Cyndia Bent, GERD.                   PCP Dr Darcus Austin FOLLOWS FOR: Still on allergy vaccine 1:50 GH and doing  well. Long course of prednisone during last flareup helped resolve her wheezing and she has been well since. Has had flu shot. Little rhinitis now using Astelin plus Flonase. We can advance allergy  vaccine to 1:10 [place] She "lost" samples of Anoro and Qvar 80 we gave last time but is interested in trying for comparison with Dulera.  08/05/14- 59 year old female former smoker followed for asthma/ COPD (quit Xolair-cost), complicated by hypertension and history of 3 lung nodules followed by Dr. Cyndia Bent, GERD.                   PCP Dr Darcus Austin FOLLOWS FOR: Pt still on allergy vaccine 1:10 GH and doing well. Never tried the samples of Anoro and Qvar we gave as alternative to compare with [facility]. Today needed to add albuterol to her morning Dulera, but otherwise doing well. Says she thinks allergy vaccine is helping. She suspects worse breathing yesterday because she had milk. Discussed potential candidacy for research studies here.  11/03/14- 59 year old female former smoker followed for asthma/ COPD (quit Xolair-cost), complicated by hypertension and history of 3 lung nodules followed by Dr. Cyndia Bent, GERD.                   PCP Dr Darcus Austin Allergy vaccine 1:10 [place] PFT 11/03/14-moderately severe obstructive airways disease with air trapping, normal diffusion capacity, slight response to bronchodilator. FEV1 1.58/64%, FEV1/FVC 0.59, RV 169%, DLCO 93%. Allergy labs 08/05/14- broadly positive mites, cat, dog, grass, tree, weed pollens Total IgE 243, elevated for many foods Eosinophils 6.8% She is convinced now that allergy vaccine is definitely helping her. She had failed to benefit from Xolair after 6 months and too expensive CXR 08/05/14 IMPRESSION: 1. Possible bronchitis. No definite pneumonia. 2. Thoracolumbar scoliosis with degenerative change. Electronically Signed  By: Ivar Drape M.D.  On: 08/05/2014 10:10  02/03/15- 59 year old female former smoker followed for asthma/ COPD (quit Xolair-cost), complicated by hypertension and history of 3 lung nodules followed by Dr. Cyndia Bent, GERD.                   PCP Dr Darcus Austin  FOLLOWS FOR: Still on  Allergy 1:10 [place] vaccine;Pt states she is doing  well with allergies and breathing at this time. This is a relatively quiet time of year. Less cough. We discussed again our considerations about Nucala and we'll watch over time to see how she does.  ROS- see HPI Constitutional:   No-   weight loss, night sweats, fevers, chills, fatigue, lassitude. HEENT:    headaches, no-difficulty swallowing, tooth/dental problems, sore throat,     No-sneezing, no- itching, ear ache, nasal congestion, post nasal drip,  CV:  No-chest pain, orthopnea, PND, swelling in lower extremities, anasarca, dizziness, +palpitations Resp: +  shortness of breath with exertion or at rest.              productive cough,  + non-productive cough,  No-  coughing up of blood.             No-change in color of mucus.  +  wheezing.   Skin: No-   rash or lesions. GI:  No-  heartburn, indigestion, no-abdominal pain, no-nausea, vomiting,  GU:  MS:  No-   joint pain or swelling.   Neuro- nothing unusual Psych:  No- change in mood or affect. + depression or anxiety.  No memory loss.  OBJ General- Alert, Oriented, Affect-appropriate, Distress- none acute Skin- + dry eczematoid hands.  Lymphadenopathy- none  Head- atraumatic            Eyes- Gross vision intact, PERRLA, conjunctivae clear secretions            Ears- Hearing, canals            Nose- Clear, Septal dev, mucus, polyps, erosion, perforation             Throat- Mallampati II , mucosa clear , drainage-none, tonsils- atrophic.  Neck- flexible , trachea midline, no stridor , thyroid nl, carotid no bruit Chest - symmetrical excursion , unlabored           Heart/CV- RRR , no murmur , no gallop  , no rub, nl s1 s2                           - JVD- none , edema- none, stasis changes- none, varices- none           Lung-   +distant breath sounds , dullness-none, rub- none, wheezing-none, cough-none           Chest wall-  Abd-  Br/ Gen/ Rectal- Not done, not indicated Extrem- cyanosis- none, clubbing, none, atrophy- none,  strength- nl Neuro- grossly intact to observation

## 2015-02-04 NOTE — Assessment & Plan Note (Signed)
Less cough and feeling better controlled. Ongoing discussion of treatment options with decision to continue as we are doing.

## 2015-02-04 NOTE — Assessment & Plan Note (Signed)
Continues allergy vaccine 1:10 without problems. Adequate control for now. We discussed upcoming fall pollen season.

## 2015-02-06 ENCOUNTER — Ambulatory Visit (INDEPENDENT_AMBULATORY_CARE_PROVIDER_SITE_OTHER): Payer: 59

## 2015-02-06 DIAGNOSIS — J309 Allergic rhinitis, unspecified: Secondary | ICD-10-CM | POA: Diagnosis not present

## 2015-02-11 ENCOUNTER — Encounter: Payer: Self-pay | Admitting: Internal Medicine

## 2015-02-13 ENCOUNTER — Ambulatory Visit (INDEPENDENT_AMBULATORY_CARE_PROVIDER_SITE_OTHER): Payer: 59

## 2015-02-13 DIAGNOSIS — J309 Allergic rhinitis, unspecified: Secondary | ICD-10-CM

## 2015-02-19 ENCOUNTER — Ambulatory Visit (INDEPENDENT_AMBULATORY_CARE_PROVIDER_SITE_OTHER): Payer: 59

## 2015-02-19 DIAGNOSIS — J309 Allergic rhinitis, unspecified: Secondary | ICD-10-CM | POA: Diagnosis not present

## 2015-02-20 ENCOUNTER — Ambulatory Visit: Payer: 59

## 2015-02-27 ENCOUNTER — Ambulatory Visit (INDEPENDENT_AMBULATORY_CARE_PROVIDER_SITE_OTHER): Payer: 59

## 2015-02-27 DIAGNOSIS — J309 Allergic rhinitis, unspecified: Secondary | ICD-10-CM | POA: Diagnosis not present

## 2015-03-06 ENCOUNTER — Ambulatory Visit (INDEPENDENT_AMBULATORY_CARE_PROVIDER_SITE_OTHER): Payer: 59

## 2015-03-06 DIAGNOSIS — J309 Allergic rhinitis, unspecified: Secondary | ICD-10-CM

## 2015-03-13 ENCOUNTER — Ambulatory Visit (INDEPENDENT_AMBULATORY_CARE_PROVIDER_SITE_OTHER): Payer: 59

## 2015-03-13 DIAGNOSIS — J309 Allergic rhinitis, unspecified: Secondary | ICD-10-CM

## 2015-03-20 ENCOUNTER — Ambulatory Visit (INDEPENDENT_AMBULATORY_CARE_PROVIDER_SITE_OTHER): Payer: 59

## 2015-03-20 DIAGNOSIS — J309 Allergic rhinitis, unspecified: Secondary | ICD-10-CM | POA: Diagnosis not present

## 2015-03-27 ENCOUNTER — Ambulatory Visit (INDEPENDENT_AMBULATORY_CARE_PROVIDER_SITE_OTHER): Payer: 59

## 2015-03-27 DIAGNOSIS — J309 Allergic rhinitis, unspecified: Secondary | ICD-10-CM

## 2015-04-03 ENCOUNTER — Ambulatory Visit (INDEPENDENT_AMBULATORY_CARE_PROVIDER_SITE_OTHER): Payer: 59

## 2015-04-03 DIAGNOSIS — J309 Allergic rhinitis, unspecified: Secondary | ICD-10-CM | POA: Diagnosis not present

## 2015-04-10 ENCOUNTER — Ambulatory Visit (INDEPENDENT_AMBULATORY_CARE_PROVIDER_SITE_OTHER): Payer: 59

## 2015-04-10 DIAGNOSIS — J309 Allergic rhinitis, unspecified: Secondary | ICD-10-CM

## 2015-04-17 ENCOUNTER — Ambulatory Visit: Payer: 59

## 2015-04-22 ENCOUNTER — Telehealth: Payer: Self-pay | Admitting: Internal Medicine

## 2015-04-22 ENCOUNTER — Ambulatory Visit (INDEPENDENT_AMBULATORY_CARE_PROVIDER_SITE_OTHER): Payer: 59

## 2015-04-22 DIAGNOSIS — J309 Allergic rhinitis, unspecified: Secondary | ICD-10-CM | POA: Diagnosis not present

## 2015-04-22 NOTE — Telephone Encounter (Signed)
Allergy Serum Extract Date Mixed: 04/22/15 Vial: AB Strength: 1:10 Here/Mail/Pick Up: Here Mixed By: Desmond Dike, CMA

## 2015-04-23 ENCOUNTER — Ambulatory Visit (INDEPENDENT_AMBULATORY_CARE_PROVIDER_SITE_OTHER): Payer: 59

## 2015-04-23 DIAGNOSIS — J309 Allergic rhinitis, unspecified: Secondary | ICD-10-CM | POA: Diagnosis not present

## 2015-05-01 ENCOUNTER — Ambulatory Visit: Payer: 59

## 2015-05-08 ENCOUNTER — Ambulatory Visit (INDEPENDENT_AMBULATORY_CARE_PROVIDER_SITE_OTHER): Payer: 59

## 2015-05-08 DIAGNOSIS — J309 Allergic rhinitis, unspecified: Secondary | ICD-10-CM | POA: Diagnosis not present

## 2015-05-15 ENCOUNTER — Ambulatory Visit (INDEPENDENT_AMBULATORY_CARE_PROVIDER_SITE_OTHER): Payer: 59

## 2015-05-15 DIAGNOSIS — J309 Allergic rhinitis, unspecified: Secondary | ICD-10-CM | POA: Diagnosis not present

## 2015-05-22 ENCOUNTER — Ambulatory Visit (INDEPENDENT_AMBULATORY_CARE_PROVIDER_SITE_OTHER): Payer: 59

## 2015-05-22 DIAGNOSIS — J309 Allergic rhinitis, unspecified: Secondary | ICD-10-CM

## 2015-05-29 ENCOUNTER — Ambulatory Visit (INDEPENDENT_AMBULATORY_CARE_PROVIDER_SITE_OTHER): Payer: 59

## 2015-05-29 DIAGNOSIS — J309 Allergic rhinitis, unspecified: Secondary | ICD-10-CM | POA: Diagnosis not present

## 2015-06-05 ENCOUNTER — Ambulatory Visit (INDEPENDENT_AMBULATORY_CARE_PROVIDER_SITE_OTHER): Payer: 59

## 2015-06-05 ENCOUNTER — Ambulatory Visit (INDEPENDENT_AMBULATORY_CARE_PROVIDER_SITE_OTHER): Payer: 59 | Admitting: Internal Medicine

## 2015-06-05 ENCOUNTER — Encounter: Payer: Self-pay | Admitting: Internal Medicine

## 2015-06-05 VITALS — BP 110/80 | HR 71 | Ht 62.5 in | Wt 146.0 lb

## 2015-06-05 DIAGNOSIS — J309 Allergic rhinitis, unspecified: Secondary | ICD-10-CM

## 2015-06-05 DIAGNOSIS — Z91018 Allergy to other foods: Secondary | ICD-10-CM | POA: Diagnosis not present

## 2015-06-05 DIAGNOSIS — J45909 Unspecified asthma, uncomplicated: Secondary | ICD-10-CM | POA: Diagnosis not present

## 2015-06-05 DIAGNOSIS — J449 Chronic obstructive pulmonary disease, unspecified: Secondary | ICD-10-CM | POA: Diagnosis not present

## 2015-06-05 NOTE — Patient Instructions (Signed)
Because of your glaucoma we are NOT going to try the Anoro sample you have at home. Also you do not need the Qvar while you stay of Dulera.  I will have [name] contact you next week with information about Nucala injections as a possibility.

## 2015-06-05 NOTE — Assessment & Plan Note (Signed)
No acute reactions. Diet unrestricted except that she avoids some nuts

## 2015-06-05 NOTE — Progress Notes (Signed)
03/30/11-59 year old female former smoker followed for asthma complicated by hypertension and history of 3 lung nodules followed by Dr. Cyndia Bent. PCP Dr Darcus Austin PFT 01/13/2003-FEV1 2.5 L/92%, FEV1/FVC 0.76 CT chest 08/11/2010 stable bilateral nodules without change since 2009. She reports doing well. Occasional wheeze is normal for her. She uses Advair once daily. This weekend noted increased cough, sneeze, wheeze. Peak flow dropped to 100. She started herself on prednisone 15 mg. 3 weeks ago she had been treated with antibiotics for diverticulitis(Dr. Collene Mares).  03/21/12- 59 year old female former smoker followed for asthma complicated by hypertension and history of 3 lung nodules followed by Dr. Cyndia Bent. PCP Dr Darcus Austin Increased SOB since last visit; wheezing, was recently given abx and pred; still coughing-yellow in color but not as thick now; Would like to get Tdap shot today. Gets flu vaccine at school Peak flow running around 200. A good day is 300. Office spirometry 03/21/2012-FEV1 1.50/59%, FVC 2.67/84%, FEV1/FVC 0.56 FEF 25-75% 0.54. Moderate obstructive airways disease. CXR 09/06/11 IMPRESSION:  No active lung disease. Small lung nodules noted on CT of the  chest are not visible on chest x-ray. Thoracolumbar scoliosis.  Original Report Authenticated By: Joretta Bachelor, M.D.   04/24/12- 59 year old female former smoker followed for asthma complicated by hypertension and history of 3 lung nodules followed by Dr. Cyndia Bent. PCP Dr Darcus Austin Denies any problems with her breathing. Cough has cleared up. Occasional palpitations, jaw pain, tingling right arm. Asks cardiology evaluation. Easy dyspnea on exertion lifting. Dizzy spells. Family history of heart disease. Took Xanax before coming today. Works 7 days per week, much stress, tired. Aware of heart burn. Continues Advair 500, Spiriva. Finishing prednisone taper. 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 Allergy profile 03/22/2012- total IgE 255.3 with significant elevations especially for dust mites, animal danders, grass and tree pollens EKG 04/24/12-sinus rhythm, NSSTTW changes.  05/23/12- 59 year old female former smoker followed for asthma complicated by hypertension and history of 3 lung nodules followed by Dr. Cyndia Bent. PCP Dr Darcus Austin No antihistamines, OTC cough syrups, or sleep meds in past 2 days  Sputum 03/23/12- Neg [employer], Nl [place] Hoarseness and raspy cough come and go- not different this Fall. She stopped aspirin she was taking daily for joint pains, and thinks her breathing might have improved. Discussed inhalers. She asks about re-trying theophylline- discussed. Wants to lose weight.  No change with fall weather. Frequent cough and hoarseness, usually not productive. Allergy skin test 05/23/12- strong puncture reactions for ragweed, tree pollens, dust mite, cat, dog, coarse. Also to beef pork and lamb. We discussed these reactions and I made distinction between environmental respiratory allergies and foods. She understands to avoid foods which cause symptoms but has not been aware of problems with any meats. PFT: 04/30/2012-moderate obstructive airways disease with response to bronchodilator, air trapping, diffusion mildly reduced. FEV1 1.92/81%, FEV1/FVC 0.59, RV 134%, DLCO 74%  07/05/12- 59 year old female former smoker followed for asthma complicated by hypertension and history of 3 lung nodules followed by Dr. Cyndia Bent. PCP Dr Darcus Austin FOLLOWS FOR: doing well with Dymista and Theophylline; has a tickle clearing throat alot right now. There've been a lot of colds going around her office. She continues theophylline which we discussed again. Alpha-galI IgE was normal, making allergy to mammalian meat unlikely  She asks about trying allergy vaccine again based on the new skin tests. CXR 09/06/11 IMPRESSION:  No active  lung  disease. Small lung nodules noted on CT of the  chest are not visible on chest x-ray. Thoracolumbar scoliosis.  Original Report Authenticated By: Joretta Bachelor, M.D.   09/04/12- 59 year old female former smoker followed for asthma complicated by hypertension and history of 3 lung nodules followed by Dr. Cyndia Bent. PCP Dr Darcus Austin FOLLOWS FOR: still having cough; insurance will not cover Advair -need new medication.  Coughing scant yellow, some shortness of breath, some frontal headache. Interested in [place]. PFTs did show significant reactive airways and she is allergy skin test positive. We will check IgE level.  10/11/11- 59 year old female former smoker followed for asthma/ COPD complicated by hypertension and history of 3 lung nodules followed by Dr. Cyndia Bent. PCP Dr Darcus Austin [facility] inhaler wasn't enough for control, also needed a prednisone taper. Finished prednisone yesterday and doing well today. We had a detailed discussion of Xolair including purpose, mechanism, risks, protocol. She is very interested. IgE 09/04/12- 217.4 CXR 09/05/12 IMPRESSION:  Hyperinflation configuration. No pulmonary edema, pneumonia, or  pleural effusion.  Original Report Authenticated By: Shanon Brow Call   11/08/2012 Acute OV  Complains of Severe cough, painful at times.  Has prod cough with light yellow mucus, hoarseness, some PND/sore throat, wheezing, chest tightness; currently taking omnicef 300mg  since 5.19.14.  hurt back this morning from coughing; in near-severe pain.  No hemoptysis , chest pain, edema, or orthopnea.  CXR last month with NAD.    04/11/13-  59 year old female former smoker followed for asthma/ COPD complicated by hypertension and history of 3 lung nodules followed by Dr. Cyndia Bent. PCP Dr Darcus Austin FOLLOWS FOR: cough-productive-yelllow(light) in color. Continues to do well with Xolair. Xolair 225/ 2 wks x 1 month so far Avoiding dairy and wheat products Daily cough productive white/yellow  thick mucus, better at the beach.  07/30/13- 59 year old female former smoker followed for asthma/ COPD complicated by hypertension and history of 3 lung nodules followed by Dr. Cyndia Bent. PCP Dr Darcus Austin FOLLOWS FOR: would like to discuss getting allergy vaccine vs Xolair(not affordable at this time) Wheezing more and using rescue inhaler more since last  Xolair shot one month ago. We discussed allergy vaccine as an option. Notices that she feels better at the beach and she feels better at home, compared with her work place. She says other workers also cough at work. She would like to explore working from home and asks support. Needs prior authorization for Dulera 100 although insurance asked her to change from Advair to [facility] within the past year.  01/30/14- 59 year old female former smoker followed for asthma/ COPD complicated by hypertension and history of 3 lung nodules followed by Dr. Cyndia Bent. PCP Dr Darcus Austin ACUTE VISIT: Increased cough, SOB and wheezing. Getting worse. No longer on Xolair/ too expensive,  and unsure if allergy vaccine 1:50 GH is truly helping. She only rarely notices some heartburn. Constant cough and chest tightness, white sputum, no fever or chest pain. Significant problem of chronic obstructive asthma. CXR 07/30/13 IMPRESSION:  No acute cardiopulmonary abnormality seen. Hyperexpansion of the  lungs is noted suggesting chronic obstructive pulmonary disease.  Electronically Signed  By: Sabino Dick M.D.  On: 07/30/2013 16:33  04/01/14- 59 year old female former smoker followed for asthma/ COPD complicated by hypertension and history of 3 lung nodules followed by Dr. Cyndia Bent, GERD.                   PCP Dr Darcus Austin FOLLOWS FOR: Still on allergy vaccine 1:50 GH and doing  well. Long course of prednisone during last flareup helped resolve her wheezing and she has been well since. Has had flu shot. Little rhinitis now using Astelin plus Flonase. We can advance allergy  vaccine to 1:10 [place] She "lost" samples of Anoro and Qvar 80 we gave last time but is interested in trying for comparison with Dulera.  08/05/14- 59 year old female former smoker followed for asthma/ COPD (quit Xolair-cost), complicated by hypertension and history of 3 lung nodules followed by Dr. Cyndia Bent, GERD.                   PCP Dr Darcus Austin FOLLOWS FOR: Pt still on allergy vaccine 1:10 GH and doing well. Never tried the samples of Anoro and Qvar we gave as alternative to compare with [facility]. Today needed to add albuterol to her morning Dulera, but otherwise doing well. Says she thinks allergy vaccine is helping. She suspects worse breathing yesterday because she had milk. Discussed potential candidacy for research studies here.  11/03/14- 59 year old female former smoker followed for asthma/ COPD (quit Xolair-cost), complicated by hypertension and history of 3 lung nodules followed by Dr. Cyndia Bent, GERD.                   PCP Dr Darcus Austin Allergy vaccine 1:10 [place] PFT 11/03/14-moderately severe obstructive airways disease with air trapping, normal diffusion capacity, slight response to bronchodilator. FEV1 1.58/64%, FEV1/FVC 0.59, RV 169%, DLCO 93%. Allergy labs 08/05/14- broadly positive mites, cat, dog, grass, tree, weed pollens Total IgE 243, elevated for many foods Eosinophils 6.8% She is convinced now that allergy vaccine is definitely helping her. She had failed to benefit from Xolair after 6 months and too expensive CXR 08/05/14 IMPRESSION: 1. Possible bronchitis. No definite pneumonia. 2. Thoracolumbar scoliosis with degenerative change. Electronically Signed  By: Ivar Drape M.D.  On: 08/05/2014 10:10  02/03/15- 59 year old female former smoker followed for asthma/ COPD (quit Xolair-cost), complicated by hypertension and history of 3 lung nodules followed by Dr. Cyndia Bent, GERD, glaucoma.                   PCP Dr Darcus Austin  FOLLOWS FOR: Still on  Allergy 1:10 [place] vaccine;Pt states  she is doing well with allergies and breathing at this time. This is a relatively quiet time of year. Less cough. We discussed again our considerations about Nucala and we'll watch over time to see how she does.  06/05/2015-59 year old female former smoker followed for asthma/COPD (quit Xolair-cost), allergic rhinitis, complicated by hypertension, history 3 lung nodules followed by Dr. Cyndia Bent, GERD Allergy vaccine 1:10 [place] FOLLOWS FOR: Still on vaccine; no change in breathing or allergies since last OV Routine chronic minimally productive cough for about an hour each morning and in the evening as she drives home from work. Not in sleep. Worst in fall and winter. Not aware of reflux. Had misplaced and never tried samples of Anoro and Qvar given last visit. Now recently diagnosed mild glaucoma so I told her not to use Anoro.  ROS- see HPI Constitutional:   No-   weight loss, night sweats, fevers, chills, fatigue, lassitude. HEENT:    headaches, no-difficulty swallowing, tooth/dental problems, sore throat,     No-sneezing, no- itching, ear ache, nasal congestion, post nasal drip,  CV:  No-chest pain, orthopnea, PND, swelling in lower extremities, anasarca, dizziness, +palpitations Resp: +  shortness of breath with exertion or at rest.              productive  cough,  + non-productive cough,  No-  coughing up of blood.             No-change in color of mucus.  +  wheezing.   Skin: No-   rash or lesions. GI:  No-  heartburn, indigestion, no-abdominal pain, no-nausea, vomiting,  GU:  MS:  No-   joint pain or swelling.   Neuro- nothing unusual Psych:  No- change in mood or affect. + depression or anxiety.  No memory loss.  OBJ General- Alert, Oriented, Affect-appropriate, Distress- none acute Skin- + dry eczematoid hands.  Lymphadenopathy- none Head- atraumatic            Eyes- Gross vision intact, PERRLA, conjunctivae clear secretions            Ears- Hearing, canals            Nose- Clear,  Septal dev, mucus, polyps, erosion, perforation             Throat- Mallampati II , mucosa clear , drainage-none, tonsils- atrophic.  Neck- flexible , trachea midline, no stridor , thyroid nl, carotid no bruit Chest - symmetrical excursion , unlabored           Heart/CV- RRR , no murmur , no gallop  , no rub, nl s1 s2                           - JVD- none , edema- none, stasis changes- none, varices- none           Lung-   +distant breath sounds , dullness-none, rub- none, wheezing-none, cough +           Chest wall-  Abd-  Br/ Gen/ Rectal- Not done, not indicated Extrem- cyanosis- none, clubbing, none, atrophy- none, strength- nl Neuro- grossly intact to observation

## 2015-06-05 NOTE — Assessment & Plan Note (Signed)
With ongoing cough suggestive of cough equivalent asthmatic bronchitis, we are giving her patient information on Nucala. Continue Ruthe Mannan

## 2015-06-08 ENCOUNTER — Encounter: Payer: Self-pay | Admitting: Internal Medicine

## 2015-06-12 ENCOUNTER — Telehealth: Payer: Self-pay | Admitting: Internal Medicine

## 2015-06-12 ENCOUNTER — Ambulatory Visit (INDEPENDENT_AMBULATORY_CARE_PROVIDER_SITE_OTHER): Payer: 59

## 2015-06-12 DIAGNOSIS — J309 Allergic rhinitis, unspecified: Secondary | ICD-10-CM | POA: Diagnosis not present

## 2015-06-12 NOTE — Telephone Encounter (Signed)
LMTCB

## 2015-06-16 NOTE — Telephone Encounter (Signed)
Pt came by the office Friday afternoon for her allergy injection. I discussed Nucala  Therapy with patient-she took information home to review and ponder on starting or not. Forms for patient to sign are in her allergy folder for her to sign if she chooses to start the process.

## 2015-06-19 ENCOUNTER — Ambulatory Visit: Payer: 59

## 2015-08-12 ENCOUNTER — Encounter: Payer: Self-pay | Admitting: Internal Medicine

## 2015-08-20 ENCOUNTER — Telehealth: Payer: Self-pay | Admitting: Internal Medicine

## 2015-08-20 MED ORDER — ALBUTEROL SULFATE HFA 108 (90 BASE) MCG/ACT IN AERS: 2.0000 | INHALATION_SPRAY | Freq: Four times a day (QID) | RESPIRATORY_TRACT | Status: DC | PRN

## 2015-08-20 MED ORDER — MOMETASONE FURO-FORMOTEROL FUM 100-5 MCG/ACT IN AERO: INHALATION_SPRAY | RESPIRATORY_TRACT | Status: DC

## 2015-08-20 NOTE — Telephone Encounter (Signed)
Spoke with pt, needs refills on Dulera, albuterol- [facility]   This has been sent.  Nothing further needed.

## 2015-10-05 ENCOUNTER — Ambulatory Visit (INDEPENDENT_AMBULATORY_CARE_PROVIDER_SITE_OTHER): Payer: Managed Care, Other (non HMO) | Admitting: Internal Medicine

## 2015-10-05 ENCOUNTER — Encounter: Payer: Self-pay | Admitting: Internal Medicine

## 2015-10-05 VITALS — BP 90/64 | HR 73 | Ht 62.5 in | Wt 136.0 lb

## 2015-10-05 DIAGNOSIS — J45909 Unspecified asthma, uncomplicated: Secondary | ICD-10-CM | POA: Diagnosis not present

## 2015-10-05 DIAGNOSIS — J449 Chronic obstructive pulmonary disease, unspecified: Secondary | ICD-10-CM

## 2015-10-05 DIAGNOSIS — J3089 Other allergic rhinitis: Secondary | ICD-10-CM

## 2015-10-05 DIAGNOSIS — J302 Other seasonal allergic rhinitis: Secondary | ICD-10-CM

## 2015-10-05 DIAGNOSIS — J309 Allergic rhinitis, unspecified: Secondary | ICD-10-CM | POA: Diagnosis not present

## 2015-10-05 NOTE — Patient Instructions (Signed)
We can wait to see how you feel off allergy vaccine as discussed, restarting later if it seems like a good idea.  Try comparing 2 different maintenance inhalers ( separately):   Sample Breo Ellipta 100     Inhale 1 puff, then rinse mouth, once daily  Then try sample Bevespi   Inhale 2 puffs, twice daily  Please call as needed

## 2015-10-05 NOTE — Progress Notes (Signed)
03/30/11-60 year old female former smoker followed for asthma complicated by hypertension and history of 3 lung nodules followed by Dr. Cyndia Bent. PCP Dr Darcus Austin PFT 01/13/2003-FEV1 2.5 L/92%, FEV1/FVC 0.76 CT chest 08/11/2010 stable bilateral nodules without change since 2009. She reports doing well. Occasional wheeze is normal for her. She uses Advair once daily. This weekend noted increased cough, sneeze, wheeze. Peak flow dropped to 100. She started herself on prednisone 15 mg. 3 weeks ago she had been treated with antibiotics for diverticulitis(Dr. Collene Mares).  03/21/12- 60 year old female former smoker followed for asthma complicated by hypertension and history of 3 lung nodules followed by Dr. Cyndia Bent. PCP Dr Darcus Austin Increased SOB since last visit; wheezing, was recently given abx and pred; still coughing-yellow in color but not as thick now; Would like to get Tdap shot today. Gets flu vaccine at school Peak flow running around 200. A good day is 300. Office spirometry 03/21/2012-FEV1 1.50/59%, FVC 2.67/84%, FEV1/FVC 0.56 FEF 25-75% 0.54. Moderate obstructive airways disease. CXR 09/06/11 IMPRESSION:  No active lung disease. Small lung nodules noted on CT of the  chest are not visible on chest x-ray. Thoracolumbar scoliosis.  Original Report Authenticated By: Joretta Bachelor, M.D.   04/24/12- 60 year old female former smoker followed for asthma complicated by hypertension and history of 3 lung nodules followed by Dr. Cyndia Bent. PCP Dr Darcus Austin Denies any problems with her breathing. Cough has cleared up. Occasional palpitations, jaw pain, tingling right arm. Asks cardiology evaluation. Easy dyspnea on exertion lifting. Dizzy spells. Family history of heart disease. Took Xanax before coming today. Works 7 days per week, much stress, tired. Aware of heart burn. Continues Advair 500, Spiriva. Finishing prednisone taper. 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 Allergy profile 03/22/2012- total IgE 255.3 with significant elevations especially for dust mites, animal danders, grass and tree pollens EKG 04/24/12-sinus rhythm, NSSTTW changes.  05/23/12- 60 year old female former smoker followed for asthma complicated by hypertension and history of 3 lung nodules followed by Dr. Cyndia Bent. PCP Dr Darcus Austin No antihistamines, OTC cough syrups, or sleep meds in past 2 days  Sputum 03/23/12- Neg [employer], Nl [place] Hoarseness and raspy cough come and go- not different this Fall. She stopped aspirin she was taking daily for joint pains, and thinks her breathing might have improved. Discussed inhalers. She asks about re-trying theophylline- discussed. Wants to lose weight.  No change with fall weather. Frequent cough and hoarseness, usually not productive. Allergy skin test 05/23/12- strong puncture reactions for ragweed, tree pollens, dust mite, cat, dog, coarse. Also to beef pork and lamb. We discussed these reactions and I made distinction between environmental respiratory allergies and foods. She understands to avoid foods which cause symptoms but has not been aware of problems with any meats. PFT: 04/30/2012-moderate obstructive airways disease with response to bronchodilator, air trapping, diffusion mildly reduced. FEV1 1.92/81%, FEV1/FVC 0.59, RV 134%, DLCO 74%  07/05/12- 60 year old female former smoker followed for asthma complicated by hypertension and history of 3 lung nodules followed by Dr. Cyndia Bent. PCP Dr Darcus Austin FOLLOWS FOR: doing well with Dymista and Theophylline; has a tickle clearing throat alot right now. There've been a lot of colds going around her office. She continues theophylline which we discussed again. Alpha-galI IgE was normal, making allergy to mammalian meat unlikely  She asks about trying allergy vaccine again based on the new skin tests. CXR 09/06/11 IMPRESSION:  No active  lung  disease. Small lung nodules noted on CT of the  chest are not visible on chest x-ray. Thoracolumbar scoliosis.  Original Report Authenticated By: Joretta Bachelor, M.D.   09/04/12- 60 year old female former smoker followed for asthma complicated by hypertension and history of 3 lung nodules followed by Dr. Cyndia Bent. PCP Dr Darcus Austin FOLLOWS FOR: still having cough; insurance will not cover Advair -need new medication.  Coughing scant yellow, some shortness of breath, some frontal headache. Interested in [place]. PFTs did show significant reactive airways and she is allergy skin test positive. We will check IgE level.  10/11/11- 60 year old female former smoker followed for asthma/ COPD complicated by hypertension and history of 3 lung nodules followed by Dr. Cyndia Bent. PCP Dr Darcus Austin [facility] inhaler wasn't enough for control, also needed a prednisone taper. Finished prednisone yesterday and doing well today. We had a detailed discussion of Xolair including purpose, mechanism, risks, protocol. She is very interested. IgE 09/04/12- 217.4 CXR 09/05/12 IMPRESSION:  Hyperinflation configuration. No pulmonary edema, pneumonia, or  pleural effusion.  Original Report Authenticated By: Shanon Brow Call   11/08/2012 Acute OV  Complains of Severe cough, painful at times.  Has prod cough with light yellow mucus, hoarseness, some PND/sore throat, wheezing, chest tightness; currently taking omnicef 300mg  since 5.19.14.  hurt back this morning from coughing; in near-severe pain.  No hemoptysis , chest pain, edema, or orthopnea.  CXR last month with NAD.    04/11/13-  60 year old female former smoker followed for asthma/ COPD complicated by hypertension and history of 3 lung nodules followed by Dr. Cyndia Bent. PCP Dr Darcus Austin FOLLOWS FOR: cough-productive-yelllow(light) in color. Continues to do well with Xolair. Xolair 225/ 2 wks x 1 month so far Avoiding dairy and wheat products Daily cough productive white/yellow  thick mucus, better at the beach.  07/30/13- 60 year old female former smoker followed for asthma/ COPD complicated by hypertension and history of 3 lung nodules followed by Dr. Cyndia Bent. PCP Dr Darcus Austin FOLLOWS FOR: would like to discuss getting allergy vaccine vs Xolair(not affordable at this time) Wheezing more and using rescue inhaler more since last  Xolair shot one month ago. We discussed allergy vaccine as an option. Notices that she feels better at the beach and she feels better at home, compared with her work place. She says other workers also cough at work. She would like to explore working from home and asks support. Needs prior authorization for Dulera 100 although insurance asked her to change from Advair to [facility] within the past year.  01/30/14- 60 year old female former smoker followed for asthma/ COPD complicated by hypertension and history of 3 lung nodules followed by Dr. Cyndia Bent. PCP Dr Darcus Austin ACUTE VISIT: Increased cough, SOB and wheezing. Getting worse. No longer on Xolair/ too expensive,  and unsure if allergy vaccine 1:50 GH is truly helping. She only rarely notices some heartburn. Constant cough and chest tightness, white sputum, no fever or chest pain. Significant problem of chronic obstructive asthma. CXR 07/30/13 IMPRESSION:  No acute cardiopulmonary abnormality seen. Hyperexpansion of the  lungs is noted suggesting chronic obstructive pulmonary disease.  Electronically Signed  By: Sabino Dick M.D.  On: 07/30/2013 16:33  04/01/14- 60 year old female former smoker followed for asthma/ COPD complicated by hypertension and history of 3 lung nodules followed by Dr. Cyndia Bent, GERD.                   PCP Dr Darcus Austin FOLLOWS FOR: Still on allergy vaccine 1:50 GH and doing  well. Long course of prednisone during last flareup helped resolve her wheezing and she has been well since. Has had flu shot. Little rhinitis now using Astelin plus Flonase. We can advance allergy  vaccine to 1:10 [place] She "lost" samples of Anoro and Qvar 80 we gave last time but is interested in trying for comparison with Dulera.  08/05/14- 60 year old female former smoker followed for asthma/ COPD (quit Xolair-cost), complicated by hypertension and history of 3 lung nodules followed by Dr. Cyndia Bent, GERD.                   PCP Dr Darcus Austin FOLLOWS FOR: Pt still on allergy vaccine 1:10 GH and doing well. Never tried the samples of Anoro and Qvar we gave as alternative to compare with [facility]. Today needed to add albuterol to her morning Dulera, but otherwise doing well. Says she thinks allergy vaccine is helping. She suspects worse breathing yesterday because she had milk. Discussed potential candidacy for research studies here.  11/03/14- 60 year old female former smoker followed for asthma/ COPD (quit Xolair-cost), complicated by hypertension and history of 3 lung nodules followed by Dr. Cyndia Bent, GERD.                   PCP Dr Darcus Austin Allergy vaccine 1:10 [place] PFT 11/03/14-moderately severe obstructive airways disease with air trapping, normal diffusion capacity, slight response to bronchodilator. FEV1 1.58/64%, FEV1/FVC 0.59, RV 169%, DLCO 93%. Allergy labs 08/05/14- broadly positive mites, cat, dog, grass, tree, weed pollens Total IgE 243, elevated for many foods Eosinophils 6.8% She is convinced now that allergy vaccine is definitely helping her. She had failed to benefit from Xolair after 6 months and too expensive CXR 08/05/14 IMPRESSION: 1. Possible bronchitis. No definite pneumonia. 2. Thoracolumbar scoliosis with degenerative change. Electronically Signed  By: Ivar Drape M.D.  On: 08/05/2014 10:10  02/03/15- 60 year old female former smoker followed for asthma/ COPD (quit Xolair-cost), complicated by hypertension and history of 3 lung nodules followed by Dr. Cyndia Bent, GERD, glaucoma.                   PCP Dr Darcus Austin  FOLLOWS FOR: Still on  Allergy 1:10 [place] vaccine;Pt states  she is doing well with allergies and breathing at this time. This is a relatively quiet time of year. Less cough. We discussed again our considerations about Nucala and we'll watch over time to see how she does.  06/05/2015-60 year old female former smoker followed for asthma/COPD (quit Xolair-cost), allergic rhinitis, complicated by hypertension, history 3 lung nodules followed by Dr. Cyndia Bent, GERD Allergy vaccine 1:10 [place] FOLLOWS FOR: Still on vaccine; no change in breathing or allergies since last OV Routine chronic minimally productive cough for about an hour each morning and in the evening as she drives home from work. Not in sleep. Worst in fall and winter. Not aware of reflux. Had misplaced and never tried samples of Anoro and Qvar given last visit. Now recently diagnosed mild glaucoma so I told her not to use Anoro.  10/05/2015-60 year old female former smoker followed for asthma/COPD (quit Xolair-cost), allergic rhinitis, complicated by hypertension, history 3 lung nodules followed by Dr. Cyndia Bent, GERD,, glaucoma Allergy vaccine 1:10 [place] drop-off December 2016 Follows for: COPD/Asthma, Retail banker. Pt c/o some SOB with exertion, morning cough with white mucus and some wheeze. Pt reports that her allergy symptoms are doing well. She would like to discuss allergy vaccine, pt has not had an injection since 12/16. Pt has also been diagnosed with glaucoma.  Weight down  147-136 over the last year   ROS- see HPI Constitutional:   No-   weight loss, night sweats, fevers, chills, fatigue, lassitude. HEENT:    headaches, no-difficulty swallowing, tooth/dental problems, sore throat,     No-sneezing, no- itching, ear ache, nasal congestion, post nasal drip,  CV:  No-chest pain, orthopnea, PND, swelling in lower extremities, anasarca, dizziness, +palpitations Resp: +  shortness of breath with exertion or at rest.              productive cough,  + non-productive cough,  No-  coughing up of blood.              No-change in color of mucus.  +  wheezing.   Skin: No-   rash or lesions. GI:  No-  heartburn, indigestion, no-abdominal pain, no-nausea, vomiting,  GU:  MS:  No-   joint pain or swelling.   Neuro- nothing unusual Psych:  No- change in mood or affect. + depression or anxiety.  No memory loss.  OBJ General- Alert, Oriented, Affect-appropriate, Distress- none acute Skin- + dry eczematoid hands.  Lymphadenopathy- none Head- atraumatic            Eyes- Gross vision intact, PERRLA, conjunctivae clear secretions            Ears- Hearing, canals            Nose- Clear, Septal dev, mucus, polyps, erosion, perforation             Throat- Mallampati II , mucosa clear , drainage-none, tonsils- atrophic.  Neck- flexible , trachea midline, no stridor , thyroid nl, carotid no bruit Chest - symmetrical excursion , unlabored           Heart/CV- RRR , no murmur , no gallop  , no rub, nl s1 s2                           - JVD- none , edema- none, stasis changes- none, varices- none           Lung-   +distant breath sounds , dullness-none, rub- none, wheezing-none, cough +           Chest wall-  Abd-  Br/ Gen/ Rectal- Not done, not indicated Extrem- cyanosis- none, clubbing, none, atrophy- none, strength- nl Neuro- grossly intact to observation

## 2015-10-06 NOTE — Assessment & Plan Note (Signed)
Since she has been off of allergy vaccine now for months, we are going to leave her off and watch. She understands the allergy clinic here will be closing after this year.

## 2015-10-06 NOTE — Assessment & Plan Note (Addendum)
We reviewed her PFT and CXR. She has significant obstructive airways disease reflecting previous smoking and chronic asthma. She is under much better control currently. Nucala or one of the newer bio- similars may become an option in the future. We are going to let her try samples of Breo and Bevespi for comparison.

## 2015-11-05 ENCOUNTER — Encounter: Payer: Self-pay | Admitting: Internal Medicine

## 2015-12-18 ENCOUNTER — Encounter: Payer: Self-pay | Admitting: Internal Medicine

## 2016-02-01 ENCOUNTER — Telehealth: Payer: Self-pay | Admitting: Internal Medicine

## 2016-02-01 MED ORDER — PREDNISONE 10 MG PO TABS
ORAL_TABLET | ORAL | 2 refills | Status: DC
Start: 2016-02-01 — End: 2016-06-06

## 2016-02-01 NOTE — Telephone Encounter (Signed)
Spoke with pt. She is requesting a prescription for Prednisone. Reports cough, chest congestion, chest tightness. Cough is producing yellow mucus. Denies wheezing or SOB. Symptoms started on Thursday. CY - please advise. Thanks.  Allergies  Allergen Reactions  . Biaxin [Clarithromycin]     GI UPSET  . Penicillins   . Tetracycline    Current Outpatient Prescriptions on File Prior to Visit  Medication Sig Dispense Refill  . albuterol (PROAIR HFA) 108 (90 Base) MCG/ACT inhaler Inhale 2 puffs into the lungs every 6 (six) hours as needed for wheezing or shortness of breath. 3 each 3  . ALPRAZolam (XANAX) 0.5 MG tablet Take 1 tablet (0.5 mg total) by mouth 2 (two) times daily as needed. 60 tablet 3  . azelastine (ASTELIN) 0.1 % nasal spray 1-2 puffs each nostril once daily at bedtime 90 mL 4  . beclomethasone (QVAR) 80 MCG/ACT inhaler Inhale 2 puffs into the lungs 2 (two) times daily. RINSE MOUTH WELL AFTER USE 1 Inhaler 0  . BENICAR HCT 20-12.5 MG per tablet Take 1 tablet by mouth daily.    Marland Kitchen buPROPion (WELLBUTRIN XL) 150 MG 24 hr tablet Take 1 tablet by mouth daily.  1  . calcium carbonate (OS-CAL) 600 MG TABS tablet Take 600 mg by mouth 2 (two) times daily with a meal.    . clobetasol (TEMOVATE) 0.05 % cream Apply topically 2 (two) times daily. 69 g 4  . Cyanocobalamin (VITAMIN B 12 PO) Take by mouth.    . EPINEPHrine 0.3 mg/0.3 mL IJ SOAJ injection Inject 0.3 mLs (0.3 mg total) into the muscle once. 1 Device 11  . fluticasone (FLONASE) 50 MCG/ACT nasal spray USE 2 SPRAYS INTO THE NOSE DAILY 16 g 0  . magnesium oxide (MAG-OX) 400 MG tablet Take 400 mg by mouth daily.    . mometasone-formoterol (DULERA) 100-5 MCG/ACT AERO USE 2 INHALATION INTO THE LUNGS TWICE A DAY, RINSE MOUTH 3 Inhaler 3  . Multiple Vitamin (MULTIVITAMIN) tablet Take 1 tablet by mouth daily.    . NONFORMULARY OR COMPOUNDED ITEM Allergy Vaccine 1:10 Given at [facility]    . Omega-3 Fatty Acids (FISH OIL) 1000 MG  CPDR Take 1 capsule by mouth daily.    . TURMERIC PO Take by mouth.     No current facility-administered medications on file prior to visit.

## 2016-02-01 NOTE — Telephone Encounter (Signed)
Pt called back. Aware of recs. RX sent in. Nothing further needed

## 2016-02-01 NOTE — Telephone Encounter (Signed)
Offer prednisone 10 mg, 4 X 2 DAYS, 3 X 2 DAYS, 2 X 2 DAYS, 1 X 2 DAYS,     Refill x 2

## 2016-02-01 NOTE — Telephone Encounter (Signed)
lmtcb x1 for pt. 

## 2016-02-04 ENCOUNTER — Ambulatory Visit (INDEPENDENT_AMBULATORY_CARE_PROVIDER_SITE_OTHER): Payer: Managed Care, Other (non HMO) | Admitting: Internal Medicine

## 2016-02-04 ENCOUNTER — Encounter: Payer: Self-pay | Admitting: Internal Medicine

## 2016-02-04 VITALS — BP 120/64 | HR 76 | Ht 62.5 in | Wt 140.6 lb

## 2016-02-04 DIAGNOSIS — J449 Chronic obstructive pulmonary disease, unspecified: Secondary | ICD-10-CM

## 2016-02-04 DIAGNOSIS — J45909 Unspecified asthma, uncomplicated: Secondary | ICD-10-CM

## 2016-02-04 DIAGNOSIS — Z91018 Allergy to other foods: Secondary | ICD-10-CM

## 2016-02-04 MED ORDER — ZAFIRLUKAST 20 MG PO TABS: 20.0000 mg | ORAL_TABLET | Freq: Two times a day (BID) | ORAL | 4 refills | Status: DC

## 2016-02-04 NOTE — Progress Notes (Signed)
Female former smoker followed for asthma complicated by hypertension and history of 3 lung nodules followed by Dr. Cyndia Bent.   PFT 01/13/2003-FEV1 2.5 L/92%, FEV1/FVC 0.76 PFT 11/03/14-moderately severe obstructive airways disease with air trapping, normal diffusion capacity, slight response to bronchodilator. FEV1 1.58/64%, FEV1/FVC 0.59, RV 169%, DLCO 93%. Allergy labs 08/05/14- broadly positive mites, cat, dog, grass, tree, weed pollens Total IgE 243, elevated for many foods Eosinophils 6.8%  10/05/2015-60 year old female former smoker followed for asthma/COPD (quit Xolair-cost), allergic rhinitis, complicated by hypertension, history 3 lung nodules followed by Dr. Cyndia Bent, GERD,, glaucoma Allergy vaccine 1:10 [facility] dropped-off December 2016 Follows for: COPD/Asthma, Retail banker. Pt c/o some SOB with exertion, morning cough with white mucus and some wheeze. Pt reports that her allergy symptoms are doing well. She would like to discuss allergy vaccine, pt has not had an injection since 12/16. Pt has also been diagnosed with glaucoma.  Weight down  147-136 over the last year  02/04/2016-60 year old female former smoker followed for asthma/COPD (quit Xolair-cost), allergic rhinitis, complicated by hypertension, history 3 lung nodules followed by Dr. Cyndia Bent, GERD, glaucoma FOLLOWS FOR: Pt states her breathing is doing much better and has loosened up since being on Prednisone. We had called in prednisone taper 8/14 for exacerbation chest congestion/bronchitis She feels quite clear today.  ROS- see HPI Constitutional:   No-   weight loss, night sweats, fevers, chills, fatigue, lassitude. HEENT:    headaches, no-difficulty swallowing, tooth/dental problems, sore throat,     No-sneezing, no- itching, ear ache, nasal congestion, post nasal drip,  CV:  No-chest pain, orthopnea, PND, swelling in lower extremities, anasarca, dizziness, +palpitations Resp: +  shortness of breath with exertion or at rest.      productive cough,   non-productive cough,  No-  coughing up of blood.             No-change in color of mucus.    wheezing.   Skin: No-   rash or lesions. GI:  No-  heartburn, indigestion, no-abdominal pain, no-nausea, vomiting,  GU:  MS:  No-   joint pain or swelling.   Neuro- nothing unusual Psych:  No- change in mood or affect. + depression or anxiety.  No memory loss.  OBJ General- Alert, Oriented, Affect-appropriate, Distress- none acute Skin- + dry eczematoid hands.  Lymphadenopathy- none Head- atraumatic            Eyes- Gross vision intact, PERRLA, conjunctivae clear secretions            Ears- Hearing, canals            Nose- Clear, no-Septal dev, mucus, polyps, erosion, perforation             Throat- Mallampati II , mucosa clear , drainage-none, tonsils- atrophic.  Neck- flexible , trachea midline, no stridor , thyroid nl, carotid no bruit Chest - symmetrical excursion , unlabored           Heart/CV- RRR , no murmur , no gallop  , no rub, nl s1 s2                           - JVD- none , edema- none, stasis changes- none, varices- none           Lung-   +distant breath sounds , dullness-none, rub- none, wheezing-none, cough + light           Chest wall-  Abd-  Br/ Gen/ Rectal- Not done, not  indicated Extrem- cyanosis- none, clubbing, none, atrophy- none, strength- nl Neuro- grossly intact to observation

## 2016-02-04 NOTE — Patient Instructions (Signed)
Script sent for Accolate ( similar to singulair)   As you come off the prednisone, try Breo   Inhaling 1 puff then rinse mouth, once daily as a maintenance controller. If it doesn't seem to work well enough, then try Bevespi  2 puffs, twice daily  Please call as needed

## 2016-02-05 NOTE — Assessment & Plan Note (Signed)
No problems as long as she avoids trigger foods.

## 2016-02-05 NOTE — Assessment & Plan Note (Addendum)
Off allergy vaccine now. Xolair wasn't cost-effective. We have again discussed Nucala as an option which she has considered. Meanwhile we will try Accolate. Singulair years ago may have helped a little. Theophylline could be an option but she has a history of GERD, currently controlled. Plan-Accolate. Comparison samples of Breo LABA ICS vs Bevespi LABA LAMA

## 2016-02-11 ENCOUNTER — Telehealth: Payer: Self-pay | Admitting: Internal Medicine

## 2016-02-11 NOTE — Telephone Encounter (Signed)
Called and spoke with pt and she is aware of directions for meds. Nothing further is needed.

## 2016-02-18 ENCOUNTER — Telehealth: Payer: Self-pay | Admitting: Internal Medicine

## 2016-02-18 NOTE — Telephone Encounter (Signed)
Spoke with pt. She is requesting samples of Breo. This medication is not listed on her medication list but was mentioned in the AVS instructions at her last OV. She is going to check on this and call us back in AM to let us know. Will await her call back.

## 2016-02-19 MED ORDER — FLUTICASONE FUROATE-VILANTEROL 100-25 MCG/INH IN AEPB: 1.0000 | INHALATION_SPRAY | Freq: Every day | RESPIRATORY_TRACT | 0 refills | Status: DC

## 2016-02-19 NOTE — Telephone Encounter (Signed)
Patient called states that the [facility] she uses is 154mcg/25mcg. She can be reached at [phone]. -pr

## 2016-02-19 NOTE — Telephone Encounter (Signed)
Pt requesting samples of Breo - pt wants to try this for another 1-2 weeks to make sure before paying a co-pay on it hat she is going to be able to tolerate it long term. 2 samples placed up front. Nothing further needed.

## 2016-03-07 ENCOUNTER — Telehealth: Payer: Self-pay | Admitting: Internal Medicine

## 2016-03-07 MED ORDER — FLUTICASONE FUROATE-VILANTEROL 100-25 MCG/INH IN AEPB: 1.0000 | INHALATION_SPRAY | Freq: Every day | RESPIRATORY_TRACT | 3 refills | Status: DC

## 2016-03-07 NOTE — Telephone Encounter (Signed)
Called and spoke with pt and she stated that she was using a sample of the breo and she feels that this is really helping her.  She stated that she wants the rx for the 3 month supply sent in to her mail order pharmacy.  This has been done and nothing further is needed.

## 2016-03-21 ENCOUNTER — Ambulatory Visit (INDEPENDENT_AMBULATORY_CARE_PROVIDER_SITE_OTHER): Payer: Managed Care, Other (non HMO)

## 2016-03-21 DIAGNOSIS — Z23 Encounter for immunization: Secondary | ICD-10-CM

## 2016-03-22 DIAGNOSIS — Z23 Encounter for immunization: Secondary | ICD-10-CM

## 2016-06-06 ENCOUNTER — Ambulatory Visit (INDEPENDENT_AMBULATORY_CARE_PROVIDER_SITE_OTHER): Payer: Managed Care, Other (non HMO) | Admitting: Internal Medicine

## 2016-06-06 ENCOUNTER — Encounter: Payer: Self-pay | Admitting: Internal Medicine

## 2016-06-06 DIAGNOSIS — J3089 Other allergic rhinitis: Secondary | ICD-10-CM | POA: Diagnosis not present

## 2016-06-06 DIAGNOSIS — J449 Chronic obstructive pulmonary disease, unspecified: Secondary | ICD-10-CM

## 2016-06-06 DIAGNOSIS — R918 Other nonspecific abnormal finding of lung field: Secondary | ICD-10-CM | POA: Diagnosis not present

## 2016-06-06 DIAGNOSIS — J302 Other seasonal allergic rhinitis: Secondary | ICD-10-CM

## 2016-06-06 DIAGNOSIS — J4489 Other specified chronic obstructive pulmonary disease: Secondary | ICD-10-CM

## 2016-06-06 MED ORDER — AZELASTINE-FLUTICASONE 137-50 MCG/ACT NA SUSP: 1.0000 | Freq: Every day | NASAL | 3 refills | Status: DC

## 2016-06-06 NOTE — Progress Notes (Signed)
HPI Female former smoker followed for asthma complicated by hypertension and history of 3 lung nodules followed by Dr. Cyndia Bent.   PFT 01/13/2003-FEV1 2.5 L/92%, FEV1/FVC 0.76 PFT 11/03/14-moderately severe obstructive airways disease with air trapping, normal diffusion capacity, slight response to bronchodilator. FEV1 1.58/64%, FEV1/FVC 0.59, RV 169%, DLCO 93%. Allergy labs 08/05/14- broadly positive mites, cat, dog, grass, tree, weed pollens Total IgE 243, elevated for many foods Eosinophils 6.8%  -------------------------------------------------------   02/04/2016-60 year old female former smoker followed for asthma/COPD (quit Xolair-cost), allergic rhinitis, complicated by hypertension, history 3 lung nodules followed by Dr. Cyndia Bent, GERD, glaucoma FOLLOWS FOR: Pt states her breathing is doing much better and has loosened up since being on Prednisone. We had called in prednisone taper 8/14 for exacerbation chest congestion/bronchitis She feels quite clear today.  06/06/2016-60 year old female former smoker followed for asthma/COPD (quit Xolair-cost), allergic rhinitis, complicated by hypertension, history 3 lung nodules followed by Dr. Cyndia Bent, GERD, glaucoma Follows for Asthma follow up We were tapering off prednisone to use Accolate,  Breo as a maintenance inhaler. If that sample didn't work she was to try [employer]. "Forgot" to try Accolate or Bevespi inhaler, basically because she was doing so well on Breo that she didn't feel the need for anything else. Does notice hoarseness on Breo but says it is acceptable. Much less need for rescue inhaler.  ROS- see HPI Constitutional:   No-   weight loss, night sweats, fevers, chills, fatigue, lassitude. HEENT:    headaches, no-difficulty swallowing, tooth/dental problems, sore throat,     No-sneezing, no- itching, ear ache, nasal congestion, post nasal drip,  CV:  No-chest pain, orthopnea, PND, swelling in lower extremities, anasarca, dizziness,  +palpitations Resp: +  shortness of breath with exertion or at rest.              productive cough,   non-productive cough,  No-  coughing up of blood.             No-change in color of mucus.    wheezing.   Skin: No-   rash or lesions. GI:  No-  heartburn, indigestion, no-abdominal pain, no-nausea, vomiting,  GU:  MS:  No-   joint pain or swelling.   Neuro- nothing unusual Psych:  No- change in mood or affect. + depression or anxiety.  No memory loss.  OBJ General- Alert, Oriented, Affect-appropriate, Distress- none acute Skin- + dry eczematoid hands, + viral pattern "fever blister" left upper lip  Lymphadenopathy- none Head- atraumatic            Eyes- Gross vision intact, PERRLA, conjunctivae clear secretions            Ears- Hearing, canals            Nose- Clear, no-Septal dev, mucus, polyps, erosion, perforation             Throat- Mallampati II , mucosa clear , drainage-none, tonsils- atrophic.  Neck- flexible , trachea midline, no stridor , thyroid nl, carotid no bruit Chest - symmetrical excursion , unlabored           Heart/CV- RRR , no murmur , no gallop  , no rub, nl s1 s2                           - JVD- none , edema- none, stasis changes- none, varices- none           Lung-   +distant breath sounds , dullness-none, rub-  none, wheezing-none, cough-none           Chest wall-  Abd-  Br/ Gen/ Rectal- Not done, not indicated Extrem- cyanosis- none, clubbing, none, atrophy- none, strength- nl Neuro- grossly intact to observation

## 2016-06-06 NOTE — Patient Instructions (Signed)
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

## 2016-06-07 NOTE — Assessment & Plan Note (Signed)
Feels well controlled currently with Flonase plus azelastine nasal sprays but asks if her insurance now would cover [place] for convenience. Plan-prescription for Dymista nasal spray with discussion

## 2016-06-07 NOTE — Assessment & Plan Note (Signed)
Nodule was not apparent on CXR 2016. She had been followed by thoracic surgery with no progression. Should have surveillance chest x-ray every couple of years. Consider if a candidate for lung cancer CT screening at [facility] age.

## 2016-06-07 NOTE — Assessment & Plan Note (Signed)
Much better controlled recently. Whether this is just because of compliant use of Breo, or other reasons not yet clear. As long as she is doing this well she does not need additional therapies such as Accolate, or consideration of Nucala which we had been discussing.

## 2016-11-09 ENCOUNTER — Telehealth: Payer: Self-pay | Admitting: Internal Medicine

## 2016-11-09 MED ORDER — FLUTICASONE FUROATE-VILANTEROL 100-25 MCG/INH IN AEPB: 1.0000 | INHALATION_SPRAY | Freq: Every day | RESPIRATORY_TRACT | 0 refills | Status: DC

## 2016-11-09 MED ORDER — FLUTICASONE FUROATE-VILANTEROL 100-25 MCG/INH IN AEPB: 1.0000 | INHALATION_SPRAY | Freq: Every day | RESPIRATORY_TRACT | 3 refills | Status: DC

## 2016-11-09 NOTE — Telephone Encounter (Signed)
3 month supply of Breo sent  I left 1 sample up front for pick up  Pt made aware Nothing further needed

## 2016-12-05 ENCOUNTER — Encounter: Payer: Self-pay | Admitting: Internal Medicine

## 2016-12-05 ENCOUNTER — Ambulatory Visit (INDEPENDENT_AMBULATORY_CARE_PROVIDER_SITE_OTHER): Payer: 59 | Admitting: Internal Medicine

## 2016-12-05 VITALS — BP 120/72 | HR 75 | Ht 62.5 in | Wt 145.0 lb

## 2016-12-05 DIAGNOSIS — J449 Chronic obstructive pulmonary disease, unspecified: Secondary | ICD-10-CM

## 2016-12-05 DIAGNOSIS — J45901 Unspecified asthma with (acute) exacerbation: Secondary | ICD-10-CM

## 2016-12-05 MED ORDER — LEVALBUTEROL HCL 0.63 MG/3ML IN NEBU
0.6300 mg | INHALATION_SOLUTION | Freq: Once | RESPIRATORY_TRACT | Status: AC
  Administered 2016-12-05: 0.63 mg via RESPIRATORY_TRACT

## 2016-12-05 MED ORDER — AZITHROMYCIN 250 MG PO TABS: ORAL_TABLET | ORAL | 0 refills | Status: DC

## 2016-12-05 MED ORDER — METHYLPREDNISOLONE ACETATE 80 MG/ML IJ SUSP
80.0000 mg | Freq: Once | INTRAMUSCULAR | Status: AC
  Administered 2016-12-05: 80 mg via INTRAMUSCULAR

## 2016-12-05 MED ORDER — PREDNISONE 10 MG PO TABS: ORAL_TABLET | ORAL | 0 refills | Status: DC

## 2016-12-05 NOTE — Progress Notes (Signed)
HPI Female former smoker followed for asthma complicated by hypertension and history of 3 lung nodules followed by Dr. Cyndia Bent.   PFT 01/13/2003-FEV1 2.5 L/92%, FEV1/FVC 0.76 PFT 11/03/14-moderately severe obstructive airways disease with air trapping, normal diffusion capacity, slight response to bronchodilator. FEV1 1.58/64%, FEV1/FVC 0.59, RV 169%, DLCO 93%. Allergy labs 08/05/14- broadly positive mites, cat, dog, grass, tree, weed pollens Total IgE 243, elevated for many foods Eosinophils 6.8%  ------------------------------------------------------  06/06/2016-61 year old female former smoker followed for asthma/COPD (quit Xolair-cost), allergic rhinitis, complicated by hypertension, history 3 lung nodules followed by Dr. Cyndia Bent, GERD, glaucoma Follows for Asthma follow up We were tapering off prednisone to use Accolate,  Breo as a maintenance inhaler. If that sample didn't work she was to try [employer]. "Forgot" to try Accolate or Bevespi inhaler, basically because she was doing so well on Breo that she didn't feel the need for anything else. Does notice hoarseness on Breo but says it is acceptable. Much less need for rescue inhaler.  12/05/16- 61 year old female former smoker followed for asthma/COPD (quit Xolair-cost), allergic rhinitis, complicated by hypertension, history 3 lung nodules followed by Dr. Cyndia Bent, GERD, glaucoma Follows For: asthma with COPD, more coughing and congestion lately Has had an exacerbation over the last 2 weeks- viral or whether related. She has 2 dogs but has had a new rescue puppy in her home over the last 2 or 3 weeks at least. That will be leaving soon when her daughter moves out.  ROS- see HPI Constitutional:   No-   weight loss, night sweats, fevers, chills, fatigue, lassitude. HEENT:    headaches, no-difficulty swallowing, tooth/dental problems, sore throat,     No-sneezing, no- itching, ear ache, nasal congestion, post nasal drip,  CV:  No-chest pain,  orthopnea, PND, swelling in lower extremities, anasarca, dizziness, +palpitations Resp: +  shortness of breath with exertion or at rest.              productive cough,   + non-productive cough,  No-  coughing up of blood.             No-change in color of mucus.    wheezing.   Skin: No-   rash or lesions. GI:  No-  heartburn, indigestion, no-abdominal pain, no-nausea, vomiting,  GU:  MS:  No-   joint pain or swelling.   Neuro- nothing unusual Psych:  No- change in mood or affect. + depression or anxiety.  No memory loss.  OBJ General- Alert, Oriented, Affect-appropriate, Distress- none acute Skin- clear Lymphadenopathy- none Head- atraumatic            Eyes- Gross vision intact, PERRLA, conjunctivae clear secretions            Ears- Hearing, canals            Nose- Clear, no-Septal dev, mucus, polyps, erosion, perforation             Throat- Mallampati II , mucosa clear , drainage-none, tonsils- atrophic.  Neck- flexible , trachea midline, no stridor , thyroid nl, carotid no bruit Chest - symmetrical excursion , unlabored           Heart/CV- RRR , no murmur , no gallop  , no rub, nl s1 s2                           - JVD- none , edema- none, stasis changes- none, varices- none  Lung-   +distant breath sounds , dullness-none, rub- none, wheezing-none, cough-+deep           Chest wall-  Abd-  Br/ Gen/ Rectal- Not done, not indicated Extrem- cyanosis- none, clubbing, none, atrophy- none, strength- nl Neuro- grossly intact to observation

## 2016-12-05 NOTE — Patient Instructions (Signed)
Scripts sent for Zpak and prednisone taper  Order- neb xop 0.63     Dx asthma exacerbation  Depo 80  Please call as needed

## 2016-12-06 NOTE — Assessment & Plan Note (Signed)
We considered strategy. We can clear acute episode using prednisone, neb treatment, Depo-Medrol, Z-Pak. Depending how long remission holds, may affect It at biologic therapies. Had to stop Xolair in the past because of expense

## 2017-04-07 ENCOUNTER — Emergency Department (HOSPITAL_COMMUNITY)
Admission: EM | Admit: 2017-04-07 | Discharge: 2017-04-07 | Disposition: A | Discharge status: Home / Self Care | Payer: 59 | Source: Home / Self Care

## 2017-04-07 ENCOUNTER — Encounter (HOSPITAL_COMMUNITY): Payer: Self-pay | Admitting: Emergency Medicine

## 2017-04-07 ENCOUNTER — Emergency Department (HOSPITAL_COMMUNITY)
Admission: EM | Admit: 2017-04-07 | Discharge: 2017-04-08 | Disposition: A | Discharge status: Home / Self Care | Payer: 59 | Attending: Emergency Medicine | Admitting: Emergency Medicine

## 2017-04-07 DIAGNOSIS — Z5321 Procedure and treatment not carried out due to patient leaving prior to being seen by health care provider: Secondary | ICD-10-CM

## 2017-04-07 DIAGNOSIS — Z79899 Other long term (current) drug therapy: Secondary | ICD-10-CM | POA: Insufficient documentation

## 2017-04-07 DIAGNOSIS — Y9389 Activity, other specified: Secondary | ICD-10-CM | POA: Diagnosis not present

## 2017-04-07 DIAGNOSIS — J45909 Unspecified asthma, uncomplicated: Secondary | ICD-10-CM | POA: Diagnosis not present

## 2017-04-07 DIAGNOSIS — Y929 Unspecified place or not applicable: Secondary | ICD-10-CM | POA: Insufficient documentation

## 2017-04-07 DIAGNOSIS — I10 Essential (primary) hypertension: Secondary | ICD-10-CM | POA: Diagnosis not present

## 2017-04-07 DIAGNOSIS — Y999 Unspecified external cause status: Secondary | ICD-10-CM

## 2017-04-07 DIAGNOSIS — Z87891 Personal history of nicotine dependence: Secondary | ICD-10-CM | POA: Insufficient documentation

## 2017-04-07 DIAGNOSIS — S60511A Abrasion of right hand, initial encounter: Secondary | ICD-10-CM | POA: Insufficient documentation

## 2017-04-07 DIAGNOSIS — W540XXA Bitten by dog, initial encounter: Secondary | ICD-10-CM

## 2017-04-07 DIAGNOSIS — S61052A Open bite of left thumb without damage to nail, initial encounter: Secondary | ICD-10-CM | POA: Insufficient documentation

## 2017-04-07 DIAGNOSIS — Z23 Encounter for immunization: Secondary | ICD-10-CM | POA: Insufficient documentation

## 2017-04-07 DIAGNOSIS — Y9289 Other specified places as the place of occurrence of the external cause: Secondary | ICD-10-CM | POA: Insufficient documentation

## 2017-04-07 DIAGNOSIS — S61012A Laceration without foreign body of left thumb without damage to nail, initial encounter: Secondary | ICD-10-CM

## 2017-04-07 DIAGNOSIS — Y939 Activity, unspecified: Secondary | ICD-10-CM

## 2017-04-07 MED ORDER — BUPIVACAINE HCL 0.25 % IJ SOLN: 20.0000 mL | Freq: Once | INTRAMUSCULAR | Status: DC

## 2017-04-07 NOTE — ED Triage Notes (Signed)
Patient was breaking up her dogs when they were fighting and got several bit markings on left thumb that urgent care would not see and sent patient to ED. Patient has scratches on rigth hands and arm as well. Patient reports dogs are up to date on shots.

## 2017-04-07 NOTE — ED Provider Notes (Signed)
[place] EMERGENCY DEPARTMENT Provider Note   CSN: [id number] Arrival date & time: 04/07/17  1753     History   Chief Complaint Chief Complaint  Patient presents with  . Animal Bite    HPI  Kelsey Duran is a 61 y.o. Female Who presents to the ED for evaluation of left thumb injury from a dog bite wound around 11 AM this morning. Patient reports she was trying to break up a dog fight between 2 of her dogs, reports the dogs are both up-to-date on shots. Patient went to [facility] physicians today and they looked and were concerned for tendon damage and recommended she come here to the ED. Patient reports throbbing pain, she is able to move the thumb some with pain. Patient reports she cleaned the wounds at home with alcohol and soap.  Patient also has a few puncture wounds to the left hand, right forearm and left elbow. Patient reports she is unsure when her last tetanus vaccination was.       Past Medical History:  Diagnosis Date  . ASTHMA 09/01/2009  . HYPERTENSION 09/08/2009  . LUNG NODULE 09/08/2009    Patient Active Problem List   Diagnosis Date Noted  . Seasonal and perennial allergic rhinitis 05/23/2012  . Food allergy 05/23/2012  . GERD (gastroesophageal reflux disease) 05/06/2012  . Chest pain 04/25/2012  . Dyspnea 04/25/2012  . HYPERTENSION 09/08/2009  . Lung nodules 09/08/2009  . Asthma with COPD ([place]) 09/01/2009    Past Surgical History:  Procedure Laterality Date  . ABDOMINAL HYSTERECTOMY    . BUNIONECTOMY    . TONSILLECTOMY      OB History    No data available       Home Medications    Prior to Admission medications   Medication Sig Start Date End Date Taking? Authorizing Provider  albuterol (PROAIR HFA) 108 (90 Base) MCG/ACT inhaler Inhale 2 puffs into the lungs every 6 (six) hours as needed for wheezing or shortness of breath. 08/20/15   Deneise Lever, MD  ALPRAZolam Duanne Moron) 0.5 MG tablet Take 1 tablet (0.5 mg total) by  mouth 2 (two) times daily as needed. 10/19/10   Marletta Lor, MD  azelastine (ASTELIN) 0.1 % nasal spray 1-2 puffs each nostril once daily at bedtime 01/28/15 02/26/17  Baird Lyons D, MD  Azelastine-Fluticasone [facility]) 137-50 MCG/ACT SUSP Place 1-2 puffs into the nose at bedtime. 06/06/16 06/06/17  Deneise Lever, MD  azithromycin (ZITHROMAX) 250 MG tablet 2 today then one daily 12/05/16   Deneise Lever, MD  BENICAR HCT 20-12.5 MG per tablet Take 1 tablet by mouth daily. 08/22/12   [provider]  buPROPion (WELLBUTRIN XL) 300 MG 24 hr tablet Take 300 mg by mouth daily.    [provider]  calcium carbonate (OS-CAL) 600 MG TABS tablet Take 600 mg by mouth 2 (two) times daily with a meal.    [provider]  clobetasol (TEMOVATE) 0.05 % cream Apply topically 2 (two) times daily. 10/19/10   Marletta Lor, MD  Cyanocobalamin (VITAMIN B 12 PO) Take by mouth.    [provider]  EPINEPHrine 0.3 mg/0.3 mL IJ SOAJ injection Inject 0.3 mLs (0.3 mg total) into the muscle once. 01/30/14   Young, Tarri Fuller D, MD  fluticasone furoate-vilanterol (BREO ELLIPTA) 100-25 MCG/INH AEPB Inhale 1 puff into the lungs daily. 11/09/16   Deneise Lever, MD  latanoprost (XALATAN) 0.005 % ophthalmic solution  11/14/15   [provider]  magnesium oxide (MAG-OX) 400 MG tablet Take 400 mg by mouth daily.    [provider]  Multiple Vitamin (MULTIVITAMIN) tablet Take 1 tablet by mouth daily.    [provider]  NONFORMULARY OR COMPOUNDED ITEM Allergy Vaccine 1:10 Given at [facility] Pulmonary    [provider]  Omega-3 Fatty Acids (FISH OIL) 1000 MG CPDR Take 1 capsule by mouth daily.    [provider]  predniSONE (DELTASONE) 10 MG tablet 4 X 2 DAYS, 3 X 2 DAYS, 2 X 2 DAYS, 1 X 2 DAYS 12/05/16   Deneise Lever, MD  TURMERIC PO Take by mouth.    [provider]  zafirlukast (ACCOLATE) 20 MG tablet Take 1 tablet (20 mg total)  by mouth 2 (two) times daily before a meal. 02/04/16   Deneise Lever, MD    Family History Family History  Problem Relation Age of Onset  . Heart attack Mother   . Pancreatitis Father   . Stroke Sister     Social History Social History  Substance Use Topics  . Smoking status: Former Smoker    Packs/day: 1.00    Years: 8.00    Types: Cigarettes    Quit date: 06/21/1983  . Smokeless tobacco: Never Used  . Alcohol use Yes     Allergies   Biaxin [clarithromycin]; Penicillins; and Tetracycline   Review of Systems Review of Systems  Constitutional: Negative for chills and fever.  Gastrointestinal: Negative for nausea and vomiting.  Musculoskeletal: Positive for arthralgias and joint swelling.  Skin: Positive for wound.  Neurological: Negative for numbness.  All other systems reviewed and are negative.    Physical Exam Updated Vital Signs BP 140/89   Pulse 75   Temp 98 F (36.7 C) (Oral)   Resp 16   SpO2 100%   Physical Exam  Constitutional: She is oriented to person, place, and time. She appears well-developed and well-nourished. No distress.  HENT:  Head: Normocephalic and atraumatic.  Eyes: Right eye exhibits no discharge. Left eye exhibits no discharge.  Pulmonary/Chest: Effort normal. No respiratory distress.  Musculoskeletal:  Dog bite over her dorsal aspect of thumb MCP with exposure of the tendon, there appears to be partial tendon damage. Good strength with flexion, but some weakness noted with extension when compared to the right hand. Sensation intact distally, good radial pulse and capillary refill. There is swelling and tenderness over the other MCP joints, 2 small puncture wounds on dorsum of left hand.  Neurological: She is alert and oriented to person, place, and time. Coordination normal.  Skin: Skin is warm and dry. Capillary refill takes less than 2 seconds. She is not diaphoretic.  2 small puncture wounds and an abrasion to the right forearm 2  small puncture wounds on the dorsum of the left hand, several scratches to the left arm One small puncture wound on the back of the left elbow  Psychiatric: She has a normal mood and affect. Her behavior is normal.  Nursing note and vitals reviewed.         ED Treatments / Results  Labs (all labs ordered are listed, but only abnormal results are displayed) Labs Reviewed - No data to display  EKG  EKG Interpretation None       Radiology Dg Hand Complete Left  Result Date: 04/08/2017 CLINICAL DATA:  Left thumb injury after dog bite this morning. EXAM: LEFT HAND - COMPLETE 3+ VIEW COMPARISON:  None. FINDINGS: There soft tissue  swelling along the radial aspect of the thumb without radiopaque foreign body. A cutaneous bone is seen along the volar and ulnar aspect of the tip of the thumb possibly a skin lesion/wart. 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 No marginal erosions. Carpal bones are intact. IMPRESSION: 1. Negative for acute fracture or malalignment. Osteoarthritis as above described. 2. Soft tissue swelling along the base of the thumb without malalignment or joint dislocation. 3. Cutaneous nodule along the volar and ulnar aspect of the tip of the thumb may represent a skin lesion/wart. Electronically Signed   By: Ashley Royalty M.D.   On: 04/08/2017 00:48    Procedures .Marland KitchenLaceration Repair Date/Time: 04/08/2017 2:07 AM Performed by: Jacqlyn Larsen Authorized by: Jacqlyn Larsen   Consent:    Consent obtained:  Verbal   Consent given by:  Patient   Risks discussed:  Infection, pain, need for additional repair and tendon damage   Alternatives discussed:  No treatment Anesthesia (see MAR for exact dosages):    Anesthesia method:  Nerve block   Block needle gauge:  25 G   Block anesthetic:  Bupivacaine 0.5% w/o epi   Block injection procedure:  Anatomic landmarks  identified, introduced needle and incremental injection   Block outcome:  Anesthesia achieved Laceration details:    Location:  Finger   Finger location:  L thumb   Length (cm):  3   Depth (mm):  5 Repair type:    Repair type:  Complex Pre-procedure details:    Preparation:  Imaging obtained to evaluate for foreign bodies Exploration:    Hemostasis achieved with:  Direct pressure   Wound exploration: wound explored through full range of motion and entire depth of wound probed and visualized     Wound extent: tendon damage     Tendon repair plan:  Refer for evaluation   Contaminated: yes   Treatment:    Area cleansed with:  Saline   Amount of cleaning:  Extensive   Irrigation solution:  Sterile saline   Irrigation method:  Syringe Skin repair:    Repair method:  Sutures   Suture size:  4-0   Suture material:  Prolene   Suture technique:  Simple interrupted   Number of sutures:  3 Approximation:    Approximation:  Loose Post-procedure details:    Dressing:  Sterile dressing and splint for protection   Patient tolerance of procedure:  Tolerated well, no immediate complications   (including critical care time)  Medications Ordered in ED Medications  bupivacaine (MARCAINE) 0.5 % injection 20 mL (not administered)  Tdap (BOOSTRIX) injection 0.5 mL (0.5 mLs Intramuscular Given 04/08/17 0133)  fentaNYL (SUBLIMAZE) injection 50 mcg (50 mcg Intravenous Given 04/08/17 0133)  clindamycin (CLEOCIN) capsule 300 mg (300 mg Oral Given 04/08/17 0132)  sulfamethoxazole-trimethoprim (BACTRIM DS,SEPTRA DS) 800-160 MG per tablet 1 tablet (1 tablet Oral Given 04/08/17 0133)     Initial Impression / Assessment and Plan / ED Course  I have reviewed the triage vital signs and the nursing notes.  Pertinent labs & imaging results that were available during my care of the patient were reviewed by me and considered in my medical decision making (see chart for details).  Patient presents with  dog bite and laceration to the left thumb, with several small puncture wounds and scratches. Patient reports dogs are both up-to-date on rabies vaccination, patient unsure when  her last tetanus was, updated here in the ED. Patient is otherwise well-appearing, and afebrile. A digital block was performed with Marcaine, good anesthesia achieved. Next laceration of the wound, tendon is exposed and there appears to be a partial laceration, some weakness with extension against resistance. Patient also has swelling over her MCP joints in the hand, x-ray shows no evidence of fracture.  Patient also seen and evaluated by Dr. Christy Gentles, who consult. Dr. Burney Gauze with hand surgery. Dr. Burney Gauze reviewed photos and imaging, and was informed of tendon laceration from the dog bite, he requests washout in the ED with loose closure and splinting, patient to be placed on antibiotics and follow-up with him in the office on Monday morning.  Wound washed out thoroughly in the ED with sterile saline, loosely approximated with 2 sutures, small third suture added for help with bleeding. Patient tolerated the procedure well with no immediate complications. Wound dressed and patient placed in thumb spica splint. Pain treated here in the ED patient received first doses of antibiotics. Patient discharged with Bactrim and clindamycin for antibiotic coverage, Percocet for pain. Patient to follow up with hand surgery on Monday, discussed strict return precautions. Patient and daughter express understanding and are in agreement with plan.  Patient discussed with Dr. Christy Gentles, who saw patient as well and agrees with plan.   Final Clinical Impressions(s) / ED Diagnoses   Final diagnoses:  Dog bite, initial encounter  Laceration of left thumb without foreign body without damage to nail, initial encounter    New Prescriptions Discharge Medication List as of 04/08/2017  1:49 AM    START taking these medications   Details    clindamycin (CLEOCIN) 300 MG capsule Take 1 capsule (300 mg total) by mouth 3 (three) times daily. X 7 days, Starting Sat 04/08/2017, Until Sat 04/15/2017, Print    ondansetron (ZOFRAN ODT) 4 MG disintegrating tablet 4mg  ODT q4 hours prn nausea/vomit, Print    oxyCODONE-acetaminophen (PERCOCET) 5-325 MG tablet Take 1 tablet by mouth every 4 (four) hours as needed., Starting Sat 04/08/2017, Print    sulfamethoxazole-trimethoprim (BACTRIM DS,SEPTRA DS) 800-160 MG tablet Take 1 tablet by mouth 2 (two) times daily., Starting Sat 04/08/2017, Until Sat 04/15/2017, Print            Jacqlyn Larsen, PA-C 04/08/17 Ronnald Ramp, MD 04/09/17 [phone]

## 2017-04-07 NOTE — ED Triage Notes (Signed)
Pt to ED for evaluation of left thumb injury from dog bite around 1100 this morning. Sts dogs are up to date on shots. Went to [employer] today and reports they were concerned for tendon damage and recommended ED visit. Reports throbbing pain but able to move with pain. Pt reports cleaning wound with alcohol and soap. Wound currently wrapped prior to arrival at [facility] with gauze and Kurlex. Radial pulse present.

## 2017-04-08 ENCOUNTER — Emergency Department (HOSPITAL_COMMUNITY): Payer: 59

## 2017-04-08 MED ORDER — SULFAMETHOXAZOLE-TRIMETHOPRIM 800-160 MG PO TABS: 1.0000 | ORAL_TABLET | Freq: Two times a day (BID) | ORAL | 0 refills | Status: AC

## 2017-04-08 MED ORDER — OXYCODONE-ACETAMINOPHEN 5-325 MG PO TABS: 1.0000 | ORAL_TABLET | ORAL | 0 refills | Status: DC | PRN

## 2017-04-08 MED ORDER — BUPIVACAINE HCL (PF) 0.5 % IJ SOLN
20.0000 mL | Freq: Once | INTRAMUSCULAR | Status: DC
  Filled 2017-04-08: qty 20

## 2017-04-08 MED ORDER — FENTANYL CITRATE (PF) 100 MCG/2ML IJ SOLN
50.0000 ug | Freq: Once | INTRAMUSCULAR | Status: AC
  Administered 2017-04-08: 50 ug via INTRAVENOUS
  Filled 2017-04-08: qty 2

## 2017-04-08 MED ORDER — CLINDAMYCIN HCL 300 MG PO CAPS: 300.0000 mg | ORAL_CAPSULE | Freq: Three times a day (TID) | ORAL | 0 refills | Status: AC

## 2017-04-08 MED ORDER — SULFAMETHOXAZOLE-TRIMETHOPRIM 800-160 MG PO TABS
1.0000 | ORAL_TABLET | Freq: Once | ORAL | Status: AC
  Administered 2017-04-08: 1 via ORAL
  Filled 2017-04-08: qty 1

## 2017-04-08 MED ORDER — CLINDAMYCIN HCL 150 MG PO CAPS
300.0000 mg | ORAL_CAPSULE | Freq: Once | ORAL | Status: AC
  Administered 2017-04-08: 300 mg via ORAL
  Filled 2017-04-08: qty 2

## 2017-04-08 MED ORDER — TETANUS-DIPHTH-ACELL PERTUSSIS 5-2.5-18.5 LF-MCG/0.5 IM SUSP
0.5000 mL | Freq: Once | INTRAMUSCULAR | Status: AC
  Administered 2017-04-08: 0.5 mL via INTRAMUSCULAR
  Filled 2017-04-08: qty 0.5

## 2017-04-08 MED ORDER — CLINDAMYCIN PHOSPHATE 600 MG/50ML IV SOLN
600.0000 mg | Freq: Once | INTRAVENOUS | Status: DC
  Filled 2017-04-08: qty 50

## 2017-04-08 MED ORDER — ONDANSETRON 4 MG PO TBDP: ORAL_TABLET | ORAL | 0 refills | Status: DC

## 2017-04-08 NOTE — Discharge Instructions (Signed)
Please take both antibiotics as prescribed. You can use Percocet as well as ibuprofen for pain, Zofran provided for any nausea. Remain in splint and keep wound dressed and dry until you see Dr. Burney Gauze. Call Dr. Bertis Ruddy office at 8:30 on Monday morning and tell them you need to be scheduled for an emergent ED follow-up. If you have worsening pain, redness, swelling, or drainage from the site, developed fevers or chills, or have worsening inflammation around other puncture wounds please return to the ED immediately.

## 2017-04-08 NOTE — ED Provider Notes (Signed)
Discussed case with Dr Burney Gauze He has reviewed imaging and photos He was informed of tendon laceration from dog bite He requests washout in the ED, loose closure with 1-2 sutures, splint, he will see in f/u on 10/22 Will place on bactrim/clindamycin for antibiotic coverage He does not feel she requires operative management at this time    Ripley Fraise, MD 04/08/17 0102

## 2017-04-08 NOTE — ED Provider Notes (Signed)
Patient gave verbal permission to utilize photo for medical documentation only The image was not stored on any personal device      Plan to consult Hand surgery for concern for tendon injury    Ripley Fraise, MD 04/08/17 [phone]

## 2017-04-08 NOTE — ED Notes (Signed)
Attempted IV x 2.  Second attempt appeared successful, positive flash, successful flush, no swelling, however pt was unable to tolerate d/t pain.  Catheter removed. IV team consult page per pt request.

## 2017-06-07 ENCOUNTER — Ambulatory Visit (INDEPENDENT_AMBULATORY_CARE_PROVIDER_SITE_OTHER): Payer: 59 | Admitting: Internal Medicine

## 2017-06-07 ENCOUNTER — Encounter: Payer: Self-pay | Admitting: Internal Medicine

## 2017-06-07 ENCOUNTER — Ambulatory Visit (INDEPENDENT_AMBULATORY_CARE_PROVIDER_SITE_OTHER)
Admission: RE | Admit: 2017-06-07 | Discharge: 2017-06-07 | Disposition: A | Discharge status: Home / Self Care | Payer: 59 | Source: Ambulatory Visit | Attending: Internal Medicine | Admitting: Internal Medicine

## 2017-06-07 VITALS — BP 114/70 | HR 86 | Ht 62.5 in | Wt 146.2 lb

## 2017-06-07 DIAGNOSIS — R918 Other nonspecific abnormal finding of lung field: Secondary | ICD-10-CM | POA: Diagnosis not present

## 2017-06-07 DIAGNOSIS — J449 Chronic obstructive pulmonary disease, unspecified: Secondary | ICD-10-CM | POA: Diagnosis not present

## 2017-06-07 NOTE — Assessment & Plan Note (Signed)
No longer being followed by thoracic surgery.  She asks status of small lung nodules. Plan-CXR

## 2017-06-07 NOTE — Progress Notes (Signed)
HPI Female former smoker followed for asthma complicated by hypertension and history of 3 lung nodules followed by Dr. Cyndia Bent.   PFT 01/13/2003-FEV1 2.5 L/92%, FEV1/FVC 0.76 PFT 11/03/14-moderately severe obstructive airways disease with air trapping, normal diffusion capacity, slight response to bronchodilator. FEV1 1.58/64%, FEV1/FVC 0.59, RV 169%, DLCO 93%. Allergy labs 08/05/14- broadly positive mites, cat, dog, grass, tree, weed pollens Total IgE 243, elevated for many foods Eosinophils 6.8%  -----------------------------------------------------  12/05/16- 61 year old female former smoker followed for asthma/COPD (quit Xolair-cost), allergic rhinitis, complicated by hypertension, history 3 lung nodules , GERD, glaucoma Follows For: asthma with COPD, more coughing and congestion lately Has had an exacerbation over the last 2 weeks- viral or whether related. She has 2 dogs but has had a new rescue puppy in her home over the last 2 or 3 weeks at least. That will be leaving soon when her daughter moves out.  06/07/17- 61 year old female former smoker followed for asthma/COPD (quit Xolair-cost), allergic rhinitis, complicated by hypertension, history 3 lung nodules followed by Dr. Cyndia Bent, GERD, glaucoma Asthma; Pt states Breo has been helping alot; has slight wheeze, cough, and/or congestion at times but not untolerable. No longer using Accolate.  Uses rescue inhaler 2 or 3 times per week.  Denies sleep disturbance from asthma and minimizes cough.  We discussed timing for pneumonia vaccines.  She will check with her PCP about previous vaccination but wants to hold off until she has recovered from thumb surgery.  ROS- see HPI + = positive Constitutional:   No-   weight loss, night sweats, fevers, chills, fatigue, lassitude. HEENT:    headaches, no-difficulty swallowing, tooth/dental problems, sore throat,     No-sneezing, no- itching, ear ache, nasal congestion, post nasal drip,  CV:  No-chest  pain, orthopnea, PND, swelling in lower extremities, anasarca, dizziness, +palpitations Resp: +  shortness of breath with exertion or at rest.              productive cough,   + non-productive cough,  No-  coughing up of blood.             No-change in color of mucus.    wheezing.   Skin: No-   rash or lesions. GI:  No-  heartburn, indigestion, no-abdominal pain, no-nausea, vomiting,  GU:  MS:  No-   joint pain or swelling.   Neuro- nothing unusual Psych:  No- change in mood or affect. + depression or anxiety.  No memory loss.  OBJ General- Alert, Oriented, Affect-appropriate, Distress- none acute Skin- clear Lymphadenopathy- none Head- atraumatic            Eyes- Gross vision intact, PERRLA, conjunctivae clear secretions            Ears- Hearing, canals            Nose- Clear, no-Septal dev, mucus, polyps, erosion, perforation             Throat- Mallampati II , mucosa clear , drainage-none, tonsils- atrophic.  Neck- flexible , trachea midline, no stridor , thyroid nl, carotid no bruit Chest - symmetrical excursion , unlabored           Heart/CV- RRR , no murmur , no gallop  , no rub, nl s1 s2                           - JVD- none , edema- none, stasis changes- none, varices- none  Lung-   +distant breath sounds , dullness-none, rub- none, wheezing-none, cough-+ raspy           Chest wall-  Abd-  Br/ Gen/ Rectal- Not done, not indicated Extrem- cyanosis- none, clubbing, none, atrophy- none, strength- nl Neuro- grossly intact to observation

## 2017-06-07 NOTE — Patient Instructions (Addendum)
We can continue Breo 100, using rescue inhaler if needed  Ok to get Pneumovax-23 pneumonia vaccine when ready- you can callus  Order- CXR    Dx lung nodules, COPD mixed type  Please call if we can help  My nurse will call Dr Nelly Laurence office to see if they have records of previous pneumonia vaccines.

## 2017-06-07 NOTE — Assessment & Plan Note (Signed)
She feels much more stable with Breo and will continue.  Discussed use of rescue inhaler.

## 2017-06-16 ENCOUNTER — Telehealth: Payer: Self-pay | Admitting: Internal Medicine

## 2017-06-16 NOTE — Telephone Encounter (Signed)
Thank you, noted by triage Pt would not specify what office she will go to for the pneumonia vaccine Will sign off and await word from pt if she would like to get vaccine here

## 2017-06-16 NOTE — Telephone Encounter (Signed)
Ok to order Pneumovax-23 for her to come and get.

## 2017-06-16 NOTE — Telephone Encounter (Signed)
Pt last seen 12.19.18 by CY: Patient Instructions  We can continue Breo 100, using rescue inhaler if needed   Ok to get Pneumovax-23 pneumonia vaccine when ready- you can callus   Order- CXR    Dx lung nodules, COPD mixed type   Please call if we can help   My nurse will call Dr Nelly Laurence office to see if they have records of previous pneumonia vaccines.    Called spoke with patient who stated that she was calling to check on the status where our office was to call her PCP to see if they have records of previous vaccines.  She stated she would also like the results of her most recent cxr >> results reviewed with patient (per result note we have attempted to call her multiple times with no success and letter was sent on 12.26.18).  Called Dr Darcus Austin' office and spoke with [facility] who checked patient's chart and show no records of pt having a pneumonia vaccine in there office.  Called spoke with patient and informed her of the above.  Pt voiced her understanding and reported she will try to get the pneumonia vaccine after the beginning of the year.  She will call if needed.  Will sign and route to CY as FYI.

## 2017-11-06 ENCOUNTER — Other Ambulatory Visit: Payer: Self-pay | Admitting: Internal Medicine

## 2017-12-06 ENCOUNTER — Telehealth: Payer: Self-pay | Admitting: Internal Medicine

## 2017-12-06 ENCOUNTER — Ambulatory Visit (INDEPENDENT_AMBULATORY_CARE_PROVIDER_SITE_OTHER): Payer: 59 | Admitting: Internal Medicine

## 2017-12-06 ENCOUNTER — Encounter: Payer: Self-pay | Admitting: Internal Medicine

## 2017-12-06 DIAGNOSIS — J302 Other seasonal allergic rhinitis: Secondary | ICD-10-CM | POA: Diagnosis not present

## 2017-12-06 DIAGNOSIS — K219 Gastro-esophageal reflux disease without esophagitis: Secondary | ICD-10-CM | POA: Diagnosis not present

## 2017-12-06 DIAGNOSIS — J449 Chronic obstructive pulmonary disease, unspecified: Secondary | ICD-10-CM

## 2017-12-06 DIAGNOSIS — J3089 Other allergic rhinitis: Secondary | ICD-10-CM | POA: Diagnosis not present

## 2017-12-06 MED ORDER — AZELASTINE HCL 0.1 % NA SOLN: NASAL | 4 refills | Status: DC

## 2017-12-06 MED ORDER — FLUTICASONE FUROATE-VILANTEROL 200-25 MCG/INH IN AEPB: 1.0000 | INHALATION_SPRAY | Freq: Every day | RESPIRATORY_TRACT | 0 refills | Status: DC

## 2017-12-06 MED ORDER — FLUTICASONE FUROATE-VILANTEROL 200-25 MCG/INH IN AEPB: 1.0000 | INHALATION_SPRAY | Freq: Every day | RESPIRATORY_TRACT | 4 refills | Status: DC

## 2017-12-06 MED ORDER — FLUTICASONE FUROATE-VILANTEROL 100-25 MCG/INH IN AEPB: 1.0000 | INHALATION_SPRAY | Freq: Every day | RESPIRATORY_TRACT | 0 refills | Status: DC

## 2017-12-06 NOTE — Patient Instructions (Addendum)
Scripts sent for Breo 200 and azelastine nasal spray  You can copy Dymista nasal spray by using otc Flonase with azelastine  Sample Breo 200     Inhale 1 puff, once daily, then rinse mouth

## 2017-12-06 NOTE — Telephone Encounter (Signed)
Called and spoke with pt who stated she was wanting a refill of theophylline due to having problems with acid reflux and stated she read that it could help with both her asthma and acid reflux.  Dr. Annamaria Boots, please advise if you are fine with a script being sent in for pt. If so, please advise dosage, quantity, and if any refills.  Thanks!  Allergies  Allergen Reactions  . Biaxin [Clarithromycin]     GI UPSET  . Penicillins   . Tetracycline      Current Outpatient Medications:  .  albuterol (PROAIR HFA) 108 (90 Base) MCG/ACT inhaler, Inhale 2 puffs into the lungs every 6 (six) hours as needed for wheezing or shortness of breath., Disp: 3 each, Rfl: 3 .  ALPRAZolam (XANAX) 0.5 MG tablet, Take 1 tablet (0.5 mg total) by mouth 2 (two) times daily as needed., Disp: 60 tablet, Rfl: 3 .  azelastine (ASTELIN) 0.1 % nasal spray, 1-2 puffs each nostril once daily at bedtime, Disp: 90 mL, Rfl: 4 .  azelastine (ASTELIN) 0.1 % nasal spray, 1 puff each nostril twice daily as needed, Disp: 90 mL, Rfl: 4 .  buPROPion (WELLBUTRIN XL) 300 MG 24 hr tablet, Take 300 mg by mouth daily., Disp: , Rfl:  .  calcium carbonate (OS-CAL) 600 MG TABS tablet, Take 600 mg by mouth 2 (two) times daily with a meal., Disp: , Rfl:  .  clobetasol (TEMOVATE) 0.05 % cream, Apply topically 2 (two) times daily., Disp: 69 g, Rfl: 4 .  Cyanocobalamin (VITAMIN B 12 PO), Take by mouth., Disp: , Rfl:  .  EPINEPHrine 0.3 mg/0.3 mL IJ SOAJ injection, Inject 0.3 mLs (0.3 mg total) into the muscle once., Disp: 1 Device, Rfl: 11 .  fluticasone furoate-vilanterol (BREO ELLIPTA) 200-25 MCG/INH AEPB, Inhale 1 puff into the lungs daily. Rinse mouth, Disp: 180 each, Rfl: 4 .  fluticasone furoate-vilanterol (BREO ELLIPTA) 200-25 MCG/INH AEPB, Inhale 1 puff into the lungs daily., Disp: 1 each, Rfl: 0 .  latanoprost (XALATAN) 0.005 % ophthalmic solution, , Disp: , Rfl:  .  magnesium oxide (MAG-OX) 400 MG tablet, Take 400 mg by mouth daily., Disp:  , Rfl:  .  Multiple Vitamin (MULTIVITAMIN) tablet, Take 1 tablet by mouth daily., Disp: , Rfl:  .  NONFORMULARY OR COMPOUNDED ITEM, Allergy Vaccine 1:10 Given at [facility], Disp: , Rfl:  .  Omega-3 Fatty Acids (FISH OIL) 1000 MG CPDR, Take 1 capsule by mouth daily., Disp: , Rfl:  .  ondansetron (ZOFRAN ODT) 4 MG disintegrating tablet, 4mg  ODT q4 hours prn nausea/vomit, Disp: 10 tablet, Rfl: 0 .  oxyCODONE-acetaminophen (PERCOCET) 5-325 MG tablet, Take 1 tablet by mouth every 4 (four) hours as needed., Disp: 10 tablet, Rfl: 0 .  predniSONE (DELTASONE) 10 MG tablet, 4 X 2 DAYS, 3 X 2 DAYS, 2 X 2 DAYS, 1 X 2 DAYS, Disp: 20 tablet, Rfl: 0 .  TURMERIC PO, Take by mouth., Disp: , Rfl:

## 2017-12-06 NOTE — Progress Notes (Signed)
HPI Female former smoker followed for asthma complicated by hypertension and history of 3 lung nodules followed by Dr. Cyndia Bent.   PFT 01/13/2003-FEV1 2.5 L/92%, FEV1/FVC 0.76 PFT 11/03/14-moderately severe obstructive airways disease with air trapping, normal diffusion capacity, slight response to bronchodilator. FEV1 1.58/64%, FEV1/FVC 0.59, RV 169%, DLCO 93%. Allergy labs 08/05/14- broadly positive mites, cat, dog, grass, tree, weed pollens Total IgE 243, elevated for many foods Eosinophils 6.8%  -----------------------------------------------------  06/07/17- 62 year old female former smoker followed for asthma/COPD (quit Xolair-cost), allergic rhinitis, complicated by hypertension, history 3 lung nodules followed by Dr. Cyndia Bent, GERD, glaucoma Asthma; Pt states Breo has been helping alot; has slight wheeze, cough, and/or congestion at times but not untolerable. No longer using Accolate.  Uses rescue inhaler 2 or 3 times per week.  Denies sleep disturbance from asthma and minimizes cough.  We discussed timing for pneumonia vaccines.  She will check with her PCP about previous vaccination but wants to hold off until she has recovered from thumb surgery.  12/06/2017-  62 year old female former smoker followed for asthma/COPD (quit Xolair-cost), allergic rhinitis, complicated by hypertension, history 3 lung nodules followed by Dr. Cyndia Bent, GERD, glaucoma -----COPD mixed type: Pt states she is doing well-no more than the usual cough, congestion, and SOB. Pt would like refill of Breo and sample to cover her. Pt will 2 generics sent to pharmacy as Insurance will no longer cover [place].  At baseline now, using less rescue inhaler which she is regular with Breo.  Dymista controlled rhinitis but is not covered by insurance so we discussed component prescriptions. She realizes chest congestion is worse when she is having more heartburn.  We again discussed an action to GERD. CXR 06/07/2017 IMPRESSION: No  active cardiopulmonary disease.  ROS- see HPI + = positive Constitutional:   No-   weight loss, night sweats, fevers, chills, fatigue, lassitude. HEENT:    headaches, no-difficulty swallowing, tooth/dental problems, sore throat,     No-sneezing, no- itching, ear ache, nasal congestion, post nasal drip,  CV:  No-chest pain, orthopnea, PND, swelling in lower extremities, anasarca, dizziness, +palpitations Resp: +  shortness of breath with exertion or at rest.              productive cough,   + non-productive cough,  No-  coughing up of blood.             No-change in color of mucus.    +wheezing.   Skin: No-   rash or lesions. GI:  No-  heartburn, indigestion, no-abdominal pain, no-nausea, vomiting,  GU:  MS:  No-   joint pain or swelling.   Neuro- nothing unusual Psych:  No- change in mood or affect. + depression or anxiety.  No memory loss.  OBJ General- Alert, Oriented, Affect-appropriate, Distress- none acute Skin- clear Lymphadenopathy- none Head- atraumatic            Eyes- Gross vision intact, PERRLA, conjunctivae clear secretions            Ears- Hearing, canals            Nose- Clear, no-Septal dev, mucus, polyps, erosion, perforation             Throat- Mallampati II , mucosa clear , drainage-none, tonsils- atrophic.  Neck- flexible , trachea midline, no stridor , thyroid nl, carotid no bruit Chest - symmetrical excursion , unlabored           Heart/CV- RRR , no murmur , no gallop  , no rub,  nl s1 s2                           - JVD- none , edema- none, stasis changes- none, varices- none           Lung-   +distant breath sounds , dullness-none, rub- none, wheezing-none,             cough-+ light           Chest wall-  Abd-  Br/ Gen/ Rectal- Not done, not indicated Extrem- cyanosis- none, clubbing, none, atrophy- none, strength- nl Neuro- grossly intact to observation

## 2017-12-06 NOTE — Assessment & Plan Note (Signed)
Emphasized strict attention to reflux precautions.

## 2017-12-06 NOTE — Assessment & Plan Note (Signed)
I would like to try for a little better control by changing to Breo 200, also emphasize GERD precautions.

## 2017-12-06 NOTE — Assessment & Plan Note (Signed)
Discussed component prescriptions alternative to Dymista nasal spray-azelastine and Flonase

## 2017-12-07 MED ORDER — THEOPHYLLINE ER 200 MG PO CP24: 200.0000 mg | ORAL_CAPSULE | Freq: Every day | ORAL | 1 refills | Status: DC

## 2017-12-07 NOTE — Telephone Encounter (Signed)
Theophyline is a relative of caffeine and increased acid reflux is a potential side effect of this medicine. I wouldn't expect it to make reflux better.

## 2017-12-07 NOTE — Telephone Encounter (Signed)
Spoke with Kelsey Duran. She is aware of Dr. Janee Morn response. Kelsey Duran is still wanting to try Theophylline.  Dr. Annamaria Boots - please advise. Thanks.

## 2017-12-07 NOTE — Telephone Encounter (Signed)
Ok to order theophylline ( theodur, theo-24)  200 mg daily   # 30, ref x 1

## 2017-12-07 NOTE — Telephone Encounter (Signed)
Spoke with pt. Rx has been sent in. Nothing further was needed. 

## 2018-01-19 ENCOUNTER — Other Ambulatory Visit: Payer: Self-pay

## 2018-03-16 ENCOUNTER — Telehealth: Payer: Self-pay | Admitting: Internal Medicine

## 2018-03-16 MED ORDER — FLUTICASONE FUROATE-VILANTEROL 200-25 MCG/INH IN AEPB: 1.0000 | INHALATION_SPRAY | Freq: Every day | RESPIRATORY_TRACT | 3 refills | Status: DC

## 2018-03-16 MED ORDER — ALBUTEROL SULFATE HFA 108 (90 BASE) MCG/ACT IN AERS: 2.0000 | INHALATION_SPRAY | Freq: Four times a day (QID) | RESPIRATORY_TRACT | 3 refills | Status: DC | PRN

## 2018-03-16 NOTE — Telephone Encounter (Signed)
Spoke with pt. She is needing a sample of Breo 200. Pt also needs refills on Proair and Breo 200 sent to [facility]. Both prescriptions have been sent in. Sample has been left up front for pick up. Nothing further was needed.

## 2018-05-28 ENCOUNTER — Encounter: Payer: Self-pay | Admitting: Internal Medicine

## 2018-05-28 ENCOUNTER — Ambulatory Visit (INDEPENDENT_AMBULATORY_CARE_PROVIDER_SITE_OTHER): Payer: 59 | Admitting: Internal Medicine

## 2018-05-28 DIAGNOSIS — J449 Chronic obstructive pulmonary disease, unspecified: Secondary | ICD-10-CM | POA: Diagnosis not present

## 2018-05-28 DIAGNOSIS — K219 Gastro-esophageal reflux disease without esophagitis: Secondary | ICD-10-CM

## 2018-05-28 MED ORDER — FLUTICASONE FUROATE-VILANTEROL 100-25 MCG/INH IN AEPB: 1.0000 | INHALATION_SPRAY | Freq: Every day | RESPIRATORY_TRACT | 0 refills | Status: DC

## 2018-05-28 NOTE — Progress Notes (Signed)
HPI Female former smoker followed for asthma complicated by hypertension and history of 3 lung nodules followed by Dr. Cyndia Bent.   PFT 01/13/2003-FEV1 2.5 L/92%, FEV1/FVC 0.76 PFT 11/03/14-moderately severe obstructive airways disease with air trapping, normal diffusion capacity, slight response to bronchodilator. FEV1 1.58/64%, FEV1/FVC 0.59, RV 169%, DLCO 93%. Allergy labs 08/05/14- broadly positive mites, cat, dog, grass, tree, weed pollens Total IgE 243, elevated for many foods Eosinophils 6.8%  ----------------------------------------------------- 12/06/2017-  62 year old female former smoker followed for asthma/COPD (quit Xolair-cost), allergic rhinitis, complicated by hypertension, history 3 lung nodules followed by Dr. Cyndia Bent, GERD, glaucoma -----COPD mixed type: Pt states she is doing well-no more than the usual cough, congestion, and SOB. Pt would like refill of Breo and sample to cover her. Pt will 2 generics sent to pharmacy as Insurance will no longer cover [place].  At baseline now, using less rescue inhaler which she is regular with Breo.  Dymista controlled rhinitis but is not covered by insurance so we discussed component prescriptions. She realizes chest congestion is worse when she is having more heartburn.  We again discussed an action to GERD. CXR 06/07/2017 IMPRESSION: No active cardiopulmonary disease.  05/28/2018- 62 year old female former smoker followed for asthma/COPD (quit Xolair-cost), allergic rhinitis, complicated by hypertension, history 3 lung nodules followed by Dr. Cyndia Bent, GERD, glaucoma -----pt states she is doing well, denies any current breathing complaints.  Breo 200, albuterol HFA, Astelin nasal spray, theophylline 200 mg daily No recent prednisone but she had a stress fracture in right ankle this spring so she is concerned about steroid exposure.  Asks about going back to Breo 100.  No exacerbations all summer and not needing to use her rescue inhaler.  Has had  flu shot.  ROS- see HPI + = positive Constitutional:   No-   weight loss, night sweats, fevers, chills, fatigue, lassitude. HEENT:    headaches, no-difficulty swallowing, tooth/dental problems, sore throat,     No-sneezing, no- itching, ear ache, nasal congestion, post nasal drip,  CV:  No-chest pain, orthopnea, PND, swelling in lower extremities, anasarca, dizziness, +palpitations Resp: +  shortness of breath with exertion or at rest.              productive cough,   + non-productive cough,  No-  coughing up of blood.             No-change in color of mucus.    +wheezing.   Skin: No-   rash or lesions. GI:  No-  heartburn, indigestion, no-abdominal pain, no-nausea, vomiting,  GU:  MS:  No-   joint pain or swelling.   Neuro- nothing unusual Psych:  No- change in mood or affect. + depression or anxiety.  No memory loss.  OBJ General- Alert, Oriented, Affect-appropriate, Distress- none acute Skin- clear Lymphadenopathy- none Head- atraumatic            Eyes- Gross vision intact, PERRLA, conjunctivae clear secretions            Ears- Hearing, canals            Nose- Clear, no-Septal dev, mucus, polyps, erosion, perforation             Throat- Mallampati II , mucosa clear , drainage-none, tonsils- atrophic.  + Somewhat raspy vocal quality without cough or stridor. Neck- flexible , trachea midline, no stridor , thyroid nl, carotid no bruit Chest - symmetrical excursion , unlabored           Heart/CV- RRR , no murmur ,  no gallop  , no rub, nl s1 s2                           - JVD- none , edema- none, stasis changes- none, varices- none           Lung-   +distant breath sounds , dullness-none, rub- none, wheezing-none,             cough-+ light           Chest wall-  Abd-  Br/ Gen/ Rectal- Not done, not indicated Extrem- cyanosis- none, clubbing, none, atrophy- none, strength- nl Neuro- grossly intact to observation

## 2018-05-28 NOTE — Patient Instructions (Signed)
Sample x 2 Breo 100   Inhale 1 puff then rinse mouth well, once daily  Try this instead of the Breo 200, for comparison.  Please call if we can help

## 2018-05-28 NOTE — Assessment & Plan Note (Signed)
Continue active reflux prevention methods previously discussed.  She feels that theophylline helps her some, but if GERD flares that should be dropped.

## 2018-05-28 NOTE — Assessment & Plan Note (Signed)
She is doing quite well this year.  We are going to see how she does with samples of Breo 100 before decision to drop back from Breo 200 to reduce steroid exposure.  She does have a fixed obstructive component. Plan-consider possible trial of another biologic in the future if insurance will cover.  Samples today for Breo 100.  If stable after 2 weeks on that she can request prescription for Breo 100.

## 2018-06-19 ENCOUNTER — Telehealth: Payer: Self-pay | Admitting: Internal Medicine

## 2018-06-19 MED ORDER — FLUTICASONE FUROATE-VILANTEROL 100-25 MCG/INH IN AEPB: 1.0000 | INHALATION_SPRAY | Freq: Every day | RESPIRATORY_TRACT | 3 refills | Status: DC

## 2018-06-19 NOTE — Telephone Encounter (Signed)
Called and spoke with patient, she stated that the [place] sample worked well for her and she is needing a refill sent to her pharmacy. Refill sent. Nothing further needed.

## 2018-07-02 ENCOUNTER — Telehealth: Payer: Self-pay | Admitting: Internal Medicine

## 2018-07-02 MED ORDER — FLUTICASONE FUROATE-VILANTEROL 200-25 MCG/INH IN AEPB: 1.0000 | INHALATION_SPRAY | Freq: Every day | RESPIRATORY_TRACT | 1 refills | Status: AC

## 2018-07-02 NOTE — Telephone Encounter (Signed)
Called and spoke to pt.  Pt is requesting to switch back to Breo 200, as she felt that her asthma had worsen with Breo 100.  Rx for Breo 200 has been sent to preferred pharmacy based of of last office note.  Routing to CY as an [place]. Thanks

## 2018-07-04 NOTE — Telephone Encounter (Signed)
Prescription sent to Patient's preferred pharmacy, 07/02/18.  Nothing further at this time.

## 2018-08-16 ENCOUNTER — Telehealth: Payer: Self-pay | Admitting: Internal Medicine

## 2018-08-16 NOTE — Telephone Encounter (Signed)
Called and spoke with patient. She stated that she that she felt like she was going to have a asthma flare.  She stated that she has starting to have coughing and some wheezing. She has been using her inhaler, and that has helped. She denies any distress or SHOB.  Patient is requested a prednisone taper to be sent to [facility]. Patient stated that we can call her tomorrow 08/17/18, at [phone].  Message routed to Dr Annamaria Boots  Allergies  Allergen Reactions  . Biaxin [Clarithromycin]     GI UPSET  . Penicillins   . Tetracycline    Current Outpatient Medications on File Prior to Visit  Medication Sig Dispense Refill  . albuterol (PROAIR HFA) 108 (90 Base) MCG/ACT inhaler Inhale 2 puffs into the lungs every 6 (six) hours as needed for wheezing or shortness of breath. 3 each 3  . ALPRAZolam (XANAX) 0.5 MG tablet Take 1 tablet (0.5 mg total) by mouth 2 (two) times daily as needed. 60 tablet 3  . azelastine (ASTELIN) 0.1 % nasal spray 1-2 puffs each nostril once daily at bedtime 90 mL 4  . azelastine (ASTELIN) 0.1 % nasal spray 1 puff each nostril twice daily as needed 90 mL 4  . buPROPion (WELLBUTRIN XL) 300 MG 24 hr tablet Take 300 mg by mouth daily.    . calcium carbonate (OS-CAL) 600 MG TABS tablet Take 600 mg by mouth 2 (two) times daily with a meal.    . clobetasol (TEMOVATE) 0.05 % cream Apply topically 2 (two) times daily. 69 g 4  . Cyanocobalamin (VITAMIN B 12 PO) Take by mouth.    . EPINEPHrine 0.3 mg/0.3 mL IJ SOAJ injection Inject 0.3 mLs (0.3 mg total) into the muscle once. 1 Device 11  . latanoprost (XALATAN) 0.005 % ophthalmic solution     . magnesium oxide (MAG-OX) 400 MG tablet Take 400 mg by mouth daily.    . Multiple Vitamin (MULTIVITAMIN) tablet Take 1 tablet by mouth daily.    . NONFORMULARY OR COMPOUNDED ITEM Allergy Vaccine 1:10 Given at [facility]    . Omega-3 Fatty Acids (FISH OIL) 1000 MG CPDR Take 1 capsule by mouth daily.    . ondansetron (ZOFRAN  ODT) 4 MG disintegrating tablet 4mg  ODT q4 hours prn nausea/vomit 10 tablet 0  . oxyCODONE-acetaminophen (PERCOCET) 5-325 MG tablet Take 1 tablet by mouth every 4 (four) hours as needed. 10 tablet 0  . predniSONE (DELTASONE) 10 MG tablet 4 X 2 DAYS, 3 X 2 DAYS, 2 X 2 DAYS, 1 X 2 DAYS 20 tablet 0  . theophylline (THEO-24) 200 MG 24 hr capsule Take 1 capsule (200 mg total) by mouth daily. 30 capsule 1  . TURMERIC PO Take by mouth.     No current facility-administered medications on file prior to visit.

## 2018-08-17 ENCOUNTER — Other Ambulatory Visit: Payer: Self-pay | Admitting: Internal Medicine

## 2018-08-17 NOTE — Telephone Encounter (Signed)
LMTCB x1 for pt.  

## 2018-08-17 NOTE — Telephone Encounter (Signed)
Please send prednisone 10 mg, # 20, 4 X 2 DAYS, 3 X 2 DAYS, 2 X 2 DAYS, 1 X 2 DAYS

## 2018-08-20 MED ORDER — PREDNISONE 10 MG PO TABS: ORAL_TABLET | ORAL | 0 refills | Status: DC

## 2018-08-20 NOTE — Telephone Encounter (Signed)
Spoke with pt. She is aware of CY's recommendation. Rx has been sent in. Nothing further was needed.  

## 2018-08-30 ENCOUNTER — Telehealth: Payer: Self-pay | Admitting: Internal Medicine

## 2018-08-30 NOTE — Telephone Encounter (Signed)
Pt came into the office to drop off work forms. I have placed forms in provider's box.

## 2018-08-30 NOTE — Telephone Encounter (Signed)
Will route this over to [place] box to follow up on

## 2018-08-31 NOTE — Telephone Encounter (Signed)
Paper work has been given to [employer] for review. Will update chart once paper work has been signed.

## 2018-09-09 IMAGING — CR DG HAND COMPLETE 3+V*L*
3 series · 3 of 3 positions shown · non-contrast
Comparison: None.

CLINICAL DATA: Left thumb injury after dog bite this morning.

EXAM:
LEFT HAND - COMPLETE 3+ VIEW

[hand pa]
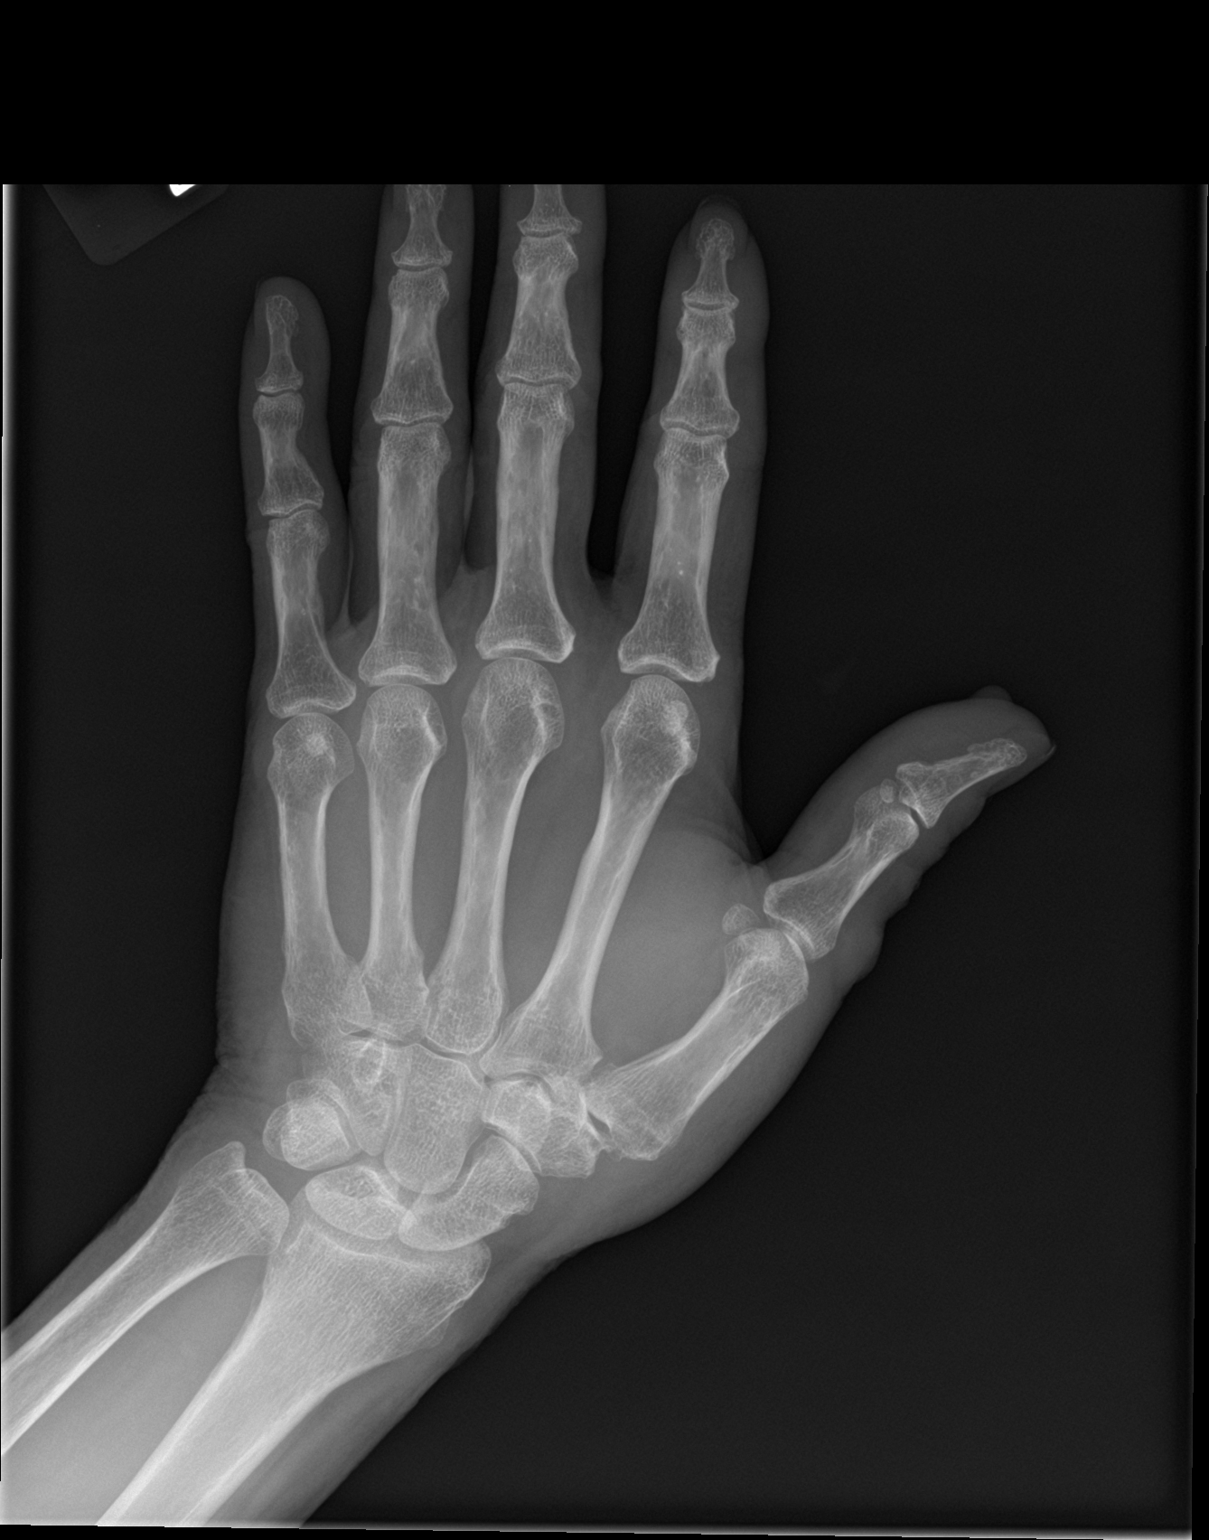

[hand obl]
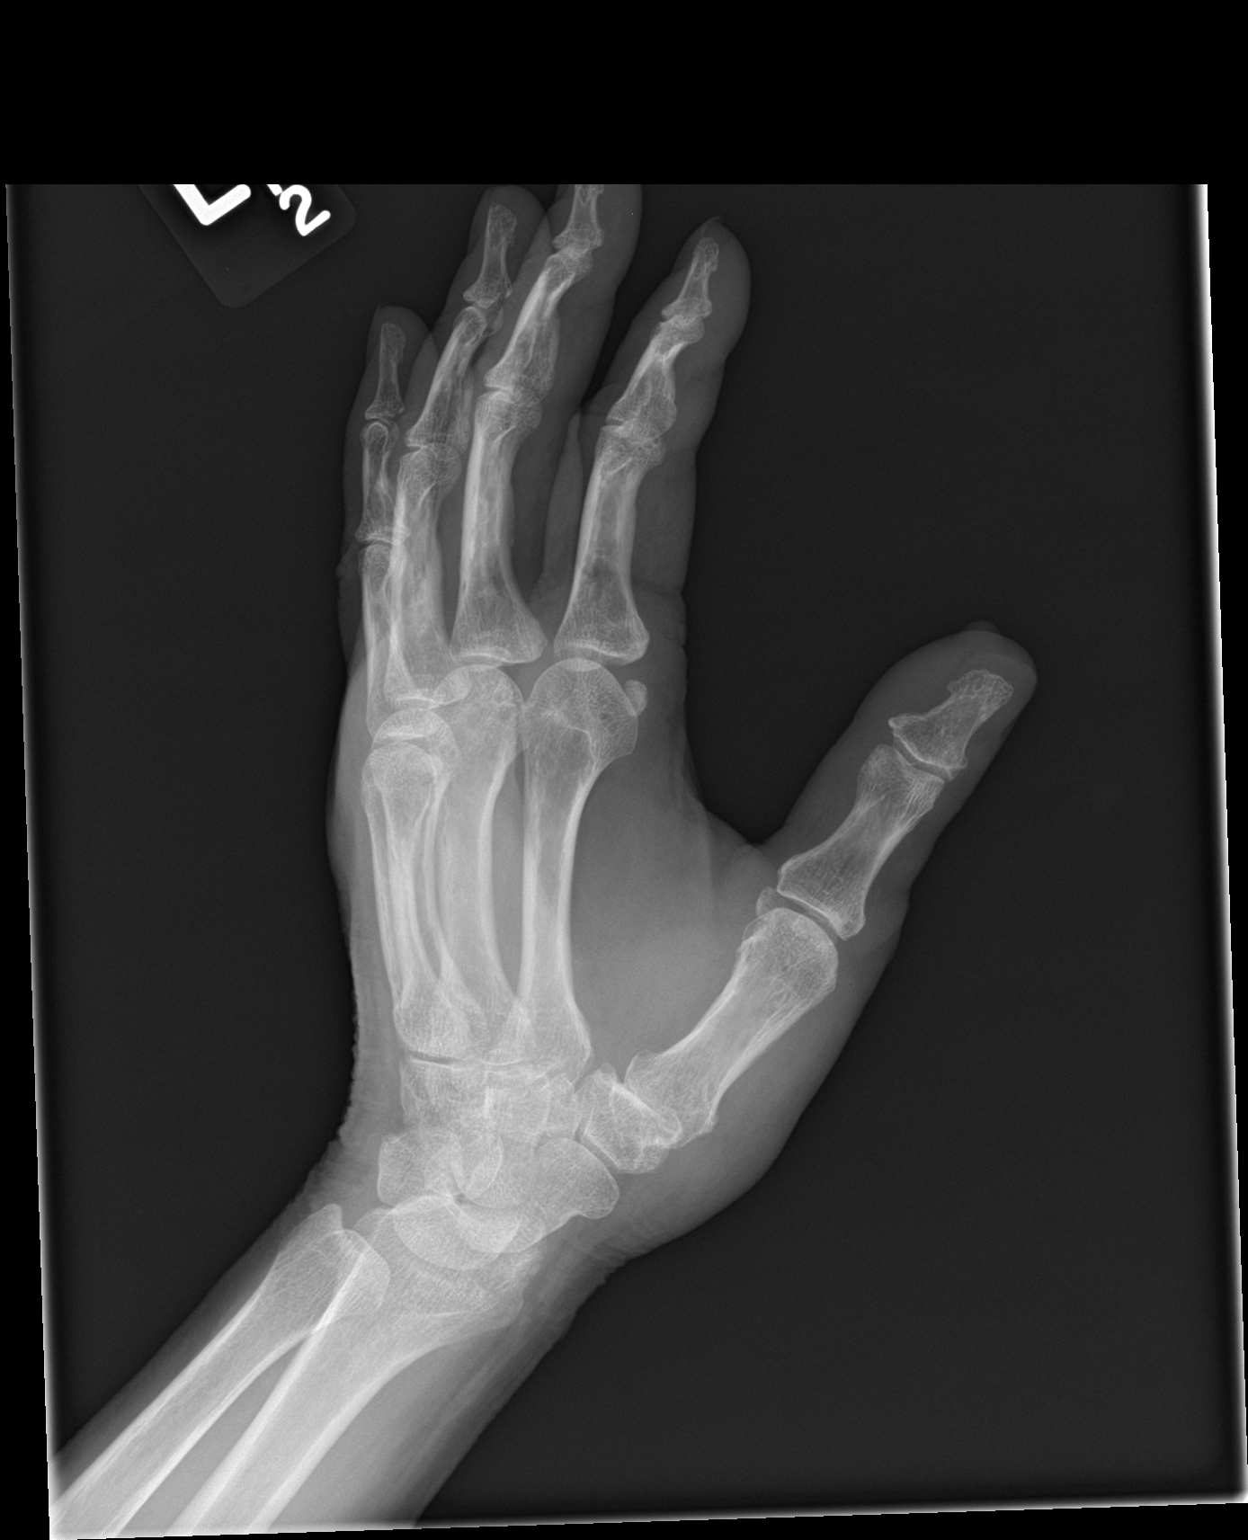

[hand lat]
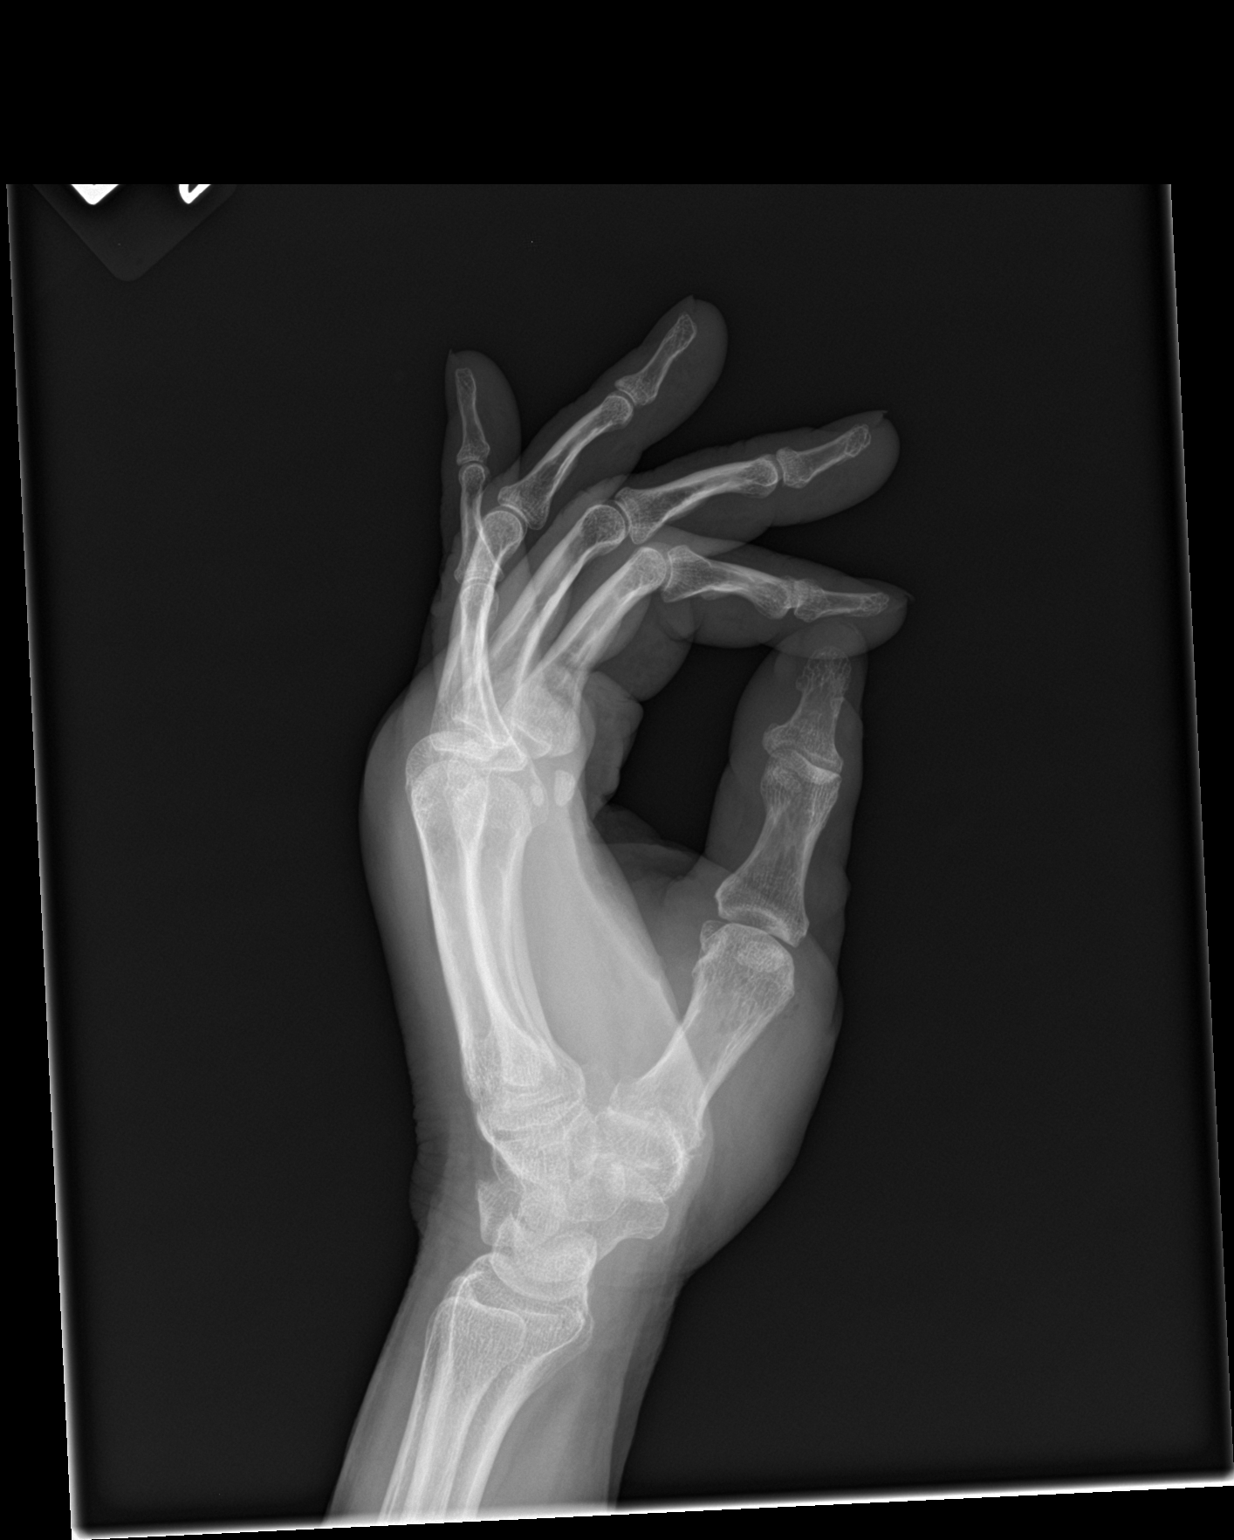

[3 of 3 positions shown; findings below may reference images not displayed]

FINDINGS: There soft tissue swelling along the radial aspect of the thumb
without radiopaque foreign body. A cutaneous bone is seen along the
volar and ulnar aspect of the tip of the thumb possibly a skin
lesion/wart. There is osteoarthritis at the base of the thumb
metacarpal with lesser degrees of joint space narrowing of the
second through fifth DIP and PIP joints. No marginal erosions.
Carpal bones are intact.
IMPRESSION: 1. Negative for acute fracture or malalignment. Osteoarthritis as
above described.
2. Soft tissue swelling along the base of the thumb without
malalignment or joint dislocation.
3. Cutaneous nodule along the volar and ulnar aspect of the tip of
the thumb may represent a skin lesion/wart.

## 2018-11-29 ENCOUNTER — Ambulatory Visit: Payer: 59 | Admitting: Internal Medicine

## 2019-03-01 ENCOUNTER — Other Ambulatory Visit: Payer: Self-pay

## 2019-03-01 ENCOUNTER — Encounter: Payer: Self-pay | Admitting: Internal Medicine

## 2019-03-01 ENCOUNTER — Ambulatory Visit (INDEPENDENT_AMBULATORY_CARE_PROVIDER_SITE_OTHER): Payer: 59 | Admitting: Internal Medicine

## 2019-03-01 ENCOUNTER — Ambulatory Visit (INDEPENDENT_AMBULATORY_CARE_PROVIDER_SITE_OTHER): Payer: 59

## 2019-03-01 VITALS — BP 118/70 | HR 78 | Temp 96.4°F | Ht 62.5 in | Wt 151.0 lb

## 2019-03-01 DIAGNOSIS — R918 Other nonspecific abnormal finding of lung field: Secondary | ICD-10-CM

## 2019-03-01 DIAGNOSIS — J449 Chronic obstructive pulmonary disease, unspecified: Secondary | ICD-10-CM

## 2019-03-01 DIAGNOSIS — Z23 Encounter for immunization: Secondary | ICD-10-CM | POA: Diagnosis not present

## 2019-03-01 MED ORDER — ALBUTEROL SULFATE (2.5 MG/3ML) 0.083% IN NEBU: 2.5000 mg | INHALATION_SOLUTION | Freq: Four times a day (QID) | RESPIRATORY_TRACT | 12 refills | Status: DC | PRN

## 2019-03-01 MED ORDER — COMPRESSOR/NEBULIZER MISC: 0 refills | Status: DC

## 2019-03-01 NOTE — Assessment & Plan Note (Signed)
Stable persistent uncomplicated with no recent exacerbation on daily Breo 200. We will get her a new nebulizer machine to hold. Plan- nebulizer/ albuterol, Pneumovax, Flu vax

## 2019-03-01 NOTE — Patient Instructions (Addendum)
Order- new nebulizer machine and albuterol neb solution through DME   Scripts printed  Order- Flu vax standard  Order- Pneumovax-23  Order- CXR   Dx Asthma/ COPD, lung nodules  Please call if we can help

## 2019-03-01 NOTE — Progress Notes (Signed)
HPI Female former smoker followed for asthma complicated by hypertension and history of 3 lung nodules followed by Dr. Cyndia Bent.   PFT 01/13/2003-FEV1 2.5 L/92%, FEV1/FVC 0.76 PFT 11/03/14-moderately severe obstructive airways disease with air trapping, normal diffusion capacity, slight response to bronchodilator. FEV1 1.58/64%, FEV1/FVC 0.59, RV 169%, DLCO 93%. Allergy labs 08/05/14- broadly positive mites, cat, dog, grass, tree, weed pollens Total IgE 243, elevated for many foods Eosinophils 6.8%  -----------------------------------------------------  05/28/2018- 63 year old female former smoker followed for asthma/COPD (quit Xolair-cost), allergic rhinitis, complicated by hypertension, history 3 lung nodules followed by Dr. Cyndia Bent, GERD, glaucoma -----pt states she is doing well, denies any current breathing complaints.  Breo 200, albuterol HFA, Astelin nasal spray, theophylline 200 mg daily No recent prednisone but she had a stress fracture in right ankle this spring so she is concerned about steroid exposure.  Asks about going back to Breo 100.  No exacerbations all summer and not needing to use her rescue inhaler.  Has had flu shot.  39/8871- 63 year old female former smoker followed for asthma/COPD (quit Xolair-cost), allergic rhinitis, complicated by hypertension, history 3 lung nodules - saw Dr. Cyndia Bent, GERD, glaucoma Last CXR 2018   -----pt states breathing is at baseline, pt states she is currently taking Breo 200 Proair hfa, Breo 200, Theo-24 200 mg qd Daily morning cough occ productive white is "normal baseline" for her. She feels well. No need for rescue inhaler in long time. Nebulizer is 63 yrs old. Denies other medical concerns. Covid careful working from home.   ROS- see HPI + = positive Constitutional:   No-   weight loss, night sweats, fevers, chills, fatigue, lassitude. HEENT:    headaches, no-difficulty swallowing, tooth/dental problems, sore throat,     No-sneezing, no-  itching, ear ache, nasal congestion, post nasal drip,  CV:  No-chest pain, orthopnea, PND, swelling in lower extremities, anasarca, dizziness, +palpitations Resp: +  shortness of breath with exertion or at rest.              productive cough,   + non-productive cough,  No-  coughing up of blood.             No-change in color of mucus.    +wheezing.   Skin: No-   rash or lesions. GI:  No-  heartburn, indigestion, no-abdominal pain, no-nausea, vomiting,  GU:  MS:  No-   joint pain or swelling.   Neuro- nothing unusual Psych:  No- change in mood or affect. + depression or anxiety.  No memory loss.  OBJ General- Alert, Oriented, Affect-appropriate, Distress- none acute Skin- clear Lymphadenopathy- none Head- atraumatic            Eyes- Gross vision intact, PERRLA, conjunctivae clear secretions            Ears- Hearing, canals            Nose- Clear, no-Septal dev, mucus, polyps, erosion, perforation             Throat- Mallampati II , mucosa clear , drainage-none, tonsils- atrophic.  Neck- flexible , trachea midline, no stridor , thyroid nl, carotid no bruit Chest - symmetrical excursion , unlabored           Heart/CV- RRR , no murmur , no gallop  , no rub, nl s1 s2                           - JVD- none , edema- none, stasis  changes- none, varices- none           Lung-   +distant breath sounds , dullness-none, rub- none, wheezing-none,             cough-+ light cough once           Chest wall-  Abd-  Br/ Gen/ Rectal- Not done, not indicated Extrem- cyanosis- none, clubbing, none, atrophy- none, strength- nl Neuro- grossly intact to observation

## 2019-03-01 NOTE — Assessment & Plan Note (Signed)
Acted benign. Former smoker Plan- update CXR

## 2019-06-05 ENCOUNTER — Encounter: Payer: Self-pay | Admitting: General Practice

## 2019-06-10 ENCOUNTER — Other Ambulatory Visit: Payer: Self-pay | Admitting: Internal Medicine

## 2019-08-29 ENCOUNTER — Other Ambulatory Visit: Payer: Self-pay

## 2019-08-29 ENCOUNTER — Ambulatory Visit (INDEPENDENT_AMBULATORY_CARE_PROVIDER_SITE_OTHER): Payer: 59 | Admitting: Internal Medicine

## 2019-08-29 ENCOUNTER — Encounter: Payer: Self-pay | Admitting: Internal Medicine

## 2019-08-29 DIAGNOSIS — R918 Other nonspecific abnormal finding of lung field: Secondary | ICD-10-CM

## 2019-08-29 DIAGNOSIS — K219 Gastro-esophageal reflux disease without esophagitis: Secondary | ICD-10-CM

## 2019-08-29 DIAGNOSIS — J302 Other seasonal allergic rhinitis: Secondary | ICD-10-CM

## 2019-08-29 DIAGNOSIS — J3089 Other allergic rhinitis: Secondary | ICD-10-CM | POA: Diagnosis not present

## 2019-08-29 DIAGNOSIS — J849 Interstitial pulmonary disease, unspecified: Secondary | ICD-10-CM

## 2019-08-29 DIAGNOSIS — J449 Chronic obstructive pulmonary disease, unspecified: Secondary | ICD-10-CM

## 2019-08-29 MED ORDER — FLUTICASONE PROPIONATE 50 MCG/ACT NA SUSP: NASAL | 12 refills | Status: DC

## 2019-08-29 MED ORDER — ALBUTEROL SULFATE HFA 108 (90 BASE) MCG/ACT IN AERS: INHALATION_SPRAY | RESPIRATORY_TRACT | 12 refills | Status: DC

## 2019-08-29 MED ORDER — BREO ELLIPTA 200-25 MCG/INH IN AEPB: 1.0000 | INHALATION_SPRAY | Freq: Every day | RESPIRATORY_TRACT | 3 refills | Status: DC

## 2019-08-29 MED ORDER — AZELASTINE HCL 0.1 % NA SOLN: NASAL | 4 refills | Status: DC

## 2019-08-29 NOTE — Patient Instructions (Signed)
Scripts sent refilling azelastine and flonase nasal sprays, and Breo 200  Order- schedule CT chest High Resolution, no contrast, ILD protocol    Dx interstitial lung disease  Please call as needed

## 2019-08-29 NOTE — Assessment & Plan Note (Signed)
Now with interstitial changes likely from COPD and microaspiration Plan- HRCT

## 2019-08-29 NOTE — Assessment & Plan Note (Signed)
Pollen season is starting Plan- refill flonase, and azelastine

## 2019-08-29 NOTE — Assessment & Plan Note (Signed)
She is aware of reflux and occasional LPR. Sleeps w HOB elevated. Plan- continue antireflux measures and acid blocker. Discuss with PCP.

## 2019-08-29 NOTE — Assessment & Plan Note (Signed)
Chronic bronchitis pattern without infection.  Plan- Continue Breo

## 2019-08-29 NOTE — Progress Notes (Signed)
HPI Female former smoker followed for asthma complicated by hypertension and history of 3 lung nodules followed by Dr. Cyndia Bent.   PFT 01/13/2003-FEV1 2.5 L/92%, FEV1/FVC 0.76 PFT 11/03/14-moderately severe obstructive airways disease with air trapping, normal diffusion capacity, slight response to bronchodilator. FEV1 1.58/64%, FEV1/FVC 0.59, RV 169%, DLCO 93%. Allergy labs 08/05/14- broadly positive mites, cat, dog, grass, tree, weed pollens Total IgE 243, elevated for many foods Eosinophils 6.8%  -----------------------------------------------------   75/1653- 64 year old female former smoker followed for asthma/COPD (quit Xolair-cost), allergic rhinitis, complicated by hypertension, history 3 lung nodules - saw Dr. Cyndia Bent, GERD, glaucoma Last CXR 2018   -----pt states breathing is at baseline, pt states she is currently taking Breo 200 Proair hfa, Breo 200, Theo-24 200 mg qd Daily morning cough occ productive white is "normal baseline" for her. She feels well. No need for rescue inhaler in long time. Nebulizer is 64 yrs old. Denies other medical concerns. Covid careful working from home.   08/29/19- 64 year old female former smoker followed for asthma/COPD (quit Xolair-cost), allergic rhinitis, complicated by hypertension, history 3 lung nodules - saw Dr. Cyndia Bent, GERD, glaucoma  Proair hfa, Breo 200,  Quit theophylline per prior discussion. Still notes occ reflux and has awakened choking, c/w aspiration/ LPR, on Nexium. Realizes reflux makes her asthma worse. Daily productive cough thick white. No blood or fever. Pollen season expected to increase nasal congestion.  Pending Covid vax. We reviewed Xray imaging to discuss interstitial markings.  No longer being followed by [facility] for hx lung nodules.  CXR 03/01/2019- IMPRESSION: 1. Mild diffuse interstitial pulmonary opacity, which may reflect edema and/or chronic interstitial change. No focal airspace opacity. 2. No pulmonary nodules  appreciated by radiograph. Recommend CT to follow known or suspected pulmonary nodules.   ROS- see HPI + = positive Constitutional:   No-   weight loss, night sweats, fevers, chills, fatigue, lassitude. HEENT:    headaches, no-difficulty swallowing, tooth/dental problems, sore throat,     No-sneezing, no- itching, ear ache, nasal congestion, post nasal drip,  CV:  No-chest pain, orthopnea, PND, swelling in lower extremities, anasarca, dizziness, +palpitations Resp: +  shortness of breath with exertion or at rest.            +pproductive cough,    non-productive cough,  No-  coughing up of blood.             No-change in color of mucus.    +wheezing.   Skin: No-   rash or lesions. GI:  No-  heartburn, indigestion, no-abdominal pain, no-nausea, vomiting,  GU:  MS:  No-   joint pain or swelling.   Neuro- nothing unusual Psych:  No- change in mood or affect. + depression or anxiety.  No memory loss.  OBJ General- Alert, Oriented, Affect-appropriate, Distress- none acute Skin- clear Lymphadenopathy- none Head- atraumatic            Eyes- Gross vision intact, PERRLA, conjunctivae clear secretions            Ears- Hearing, canals            Nose- Clear, no-Septal dev, mucus, polyps, erosion, perforation             Throat- Mallampati II , mucosa clear , drainage-none, tonsils- atrophic.  Neck- flexible , trachea midline, no stridor , thyroid nl, carotid no bruit Chest - symmetrical excursion , unlabored           Heart/CV- RRR , no murmur , no gallop  , no  rub, nl s1 s2                           - JVD- none , edema- none, stasis changes- none, varices- none           Lung-   +distant breath sounds , dullness-none, rub- none, wheezing-none,             cough-none           Chest wall-  Abd-  Br/ Gen/ Rectal- Not done, not indicated Extrem- cyanosis- none, clubbing, none, atrophy- none, strength- nl Neuro- grossly intact to observation

## 2019-08-30 ENCOUNTER — Other Ambulatory Visit: Payer: Self-pay

## 2019-08-30 ENCOUNTER — Telehealth: Payer: Self-pay

## 2019-08-30 MED ORDER — ALBUTEROL SULFATE HFA 108 (90 BASE) MCG/ACT IN AERS: INHALATION_SPRAY | RESPIRATORY_TRACT | 12 refills | Status: DC

## 2019-08-30 NOTE — Telephone Encounter (Signed)
Called and spoke to patient regarding her medication. [facility] [place] [facility] for Albuterol HHFA inhaler. Patient stated she wants [employer] it works better. Patient wants script sent to [facility] pharmacy. Will send in script for patient.

## 2019-09-05 ENCOUNTER — Telehealth: Payer: Self-pay | Admitting: Internal Medicine

## 2019-09-05 NOTE — Telephone Encounter (Signed)
Called [facility] and spoke with pharmacist Birdsong. Stated to [facility] that we can change pt's proair rx to the generic albuterol since it is the one that is covered by pt's insurance. Nothing further needed.

## 2019-09-06 ENCOUNTER — Inpatient Hospital Stay: Admission: RE | Admit: 2019-09-06 | Payer: 59 | Source: Ambulatory Visit

## 2019-09-12 ENCOUNTER — Ambulatory Visit (INDEPENDENT_AMBULATORY_CARE_PROVIDER_SITE_OTHER)
Admission: RE | Admit: 2019-09-12 | Discharge: 2019-09-12 | Disposition: A | Discharge status: Home / Self Care | Payer: 59 | Source: Ambulatory Visit | Attending: Internal Medicine | Admitting: Internal Medicine

## 2019-09-12 ENCOUNTER — Other Ambulatory Visit: Payer: Self-pay

## 2019-09-12 DIAGNOSIS — J849 Interstitial pulmonary disease, unspecified: Secondary | ICD-10-CM

## 2019-09-16 ENCOUNTER — Other Ambulatory Visit: Payer: Self-pay | Admitting: Internal Medicine

## 2019-09-16 DIAGNOSIS — R918 Other nonspecific abnormal finding of lung field: Secondary | ICD-10-CM

## 2019-09-16 NOTE — Progress Notes (Signed)
Patient identification verified. Results of recent chest CT reviewed. Per Dr. Annamaria Boots, small airway branches show signs of the asthmatic bronchitis we know about. The small lung nodules appear to be benign. Dr. Annamaria Boots agrees with the radiologist that we should schedule a future CT of chest, no contrast. Patient verbalized understanding of results and plan of care. Orders for CT placed.

## 2019-12-20 ENCOUNTER — Telehealth: Payer: Self-pay | Admitting: Internal Medicine

## 2019-12-20 MED ORDER — BREO ELLIPTA 200-25 MCG/INH IN AEPB: 1.0000 | INHALATION_SPRAY | Freq: Every day | RESPIRATORY_TRACT | 0 refills | Status: DC

## 2019-12-20 NOTE — Telephone Encounter (Signed)
Patient is out of Breo 200, one sample left for patient to pick up. Also, Breo 200 refill sent to [facility]. [facility] prefers [facility] so [facility] is a one time refill.

## 2020-03-02 ENCOUNTER — Other Ambulatory Visit: Payer: Self-pay

## 2020-03-02 ENCOUNTER — Encounter: Payer: Self-pay | Admitting: Internal Medicine

## 2020-03-02 ENCOUNTER — Ambulatory Visit (INDEPENDENT_AMBULATORY_CARE_PROVIDER_SITE_OTHER): Payer: 59 | Admitting: Internal Medicine

## 2020-03-02 VITALS — BP 118/72 | HR 80 | Temp 97.7°F | Wt 146.4 lb

## 2020-03-02 DIAGNOSIS — R918 Other nonspecific abnormal finding of lung field: Secondary | ICD-10-CM | POA: Diagnosis not present

## 2020-03-02 DIAGNOSIS — K219 Gastro-esophageal reflux disease without esophagitis: Secondary | ICD-10-CM

## 2020-03-02 DIAGNOSIS — J449 Chronic obstructive pulmonary disease, unspecified: Secondary | ICD-10-CM

## 2020-03-02 DIAGNOSIS — Z23 Encounter for immunization: Secondary | ICD-10-CM

## 2020-03-02 MED ORDER — BREO ELLIPTA 200-25 MCG/INH IN AEPB: INHALATION_SPRAY | RESPIRATORY_TRACT | 4 refills | Status: DC

## 2020-03-02 NOTE — Progress Notes (Signed)
HPI Female former smoker followed for asthma complicated by hypertension and history of 3 lung nodules followed by Dr. Cyndia Bent.   PFT 01/13/2003-FEV1 2.5 L/92%, FEV1/FVC 0.76 PFT 11/03/14-moderately severe obstructive airways disease with air trapping, normal diffusion capacity, slight response to bronchodilator. FEV1 1.58/64%, FEV1/FVC 0.59, RV 169%, DLCO 93%. Allergy labs 08/05/14- broadly positive mites, cat, dog, grass, tree, weed pollens Total IgE 243, elevated for many foods Eosinophils 6.8%  -----------------------------------------------------  08/29/19- 64 year old female former smoker followed for asthma/COPD (quit Xolair-cost), allergic rhinitis, complicated by hypertension, history 3 lung nodules - saw Dr. Cyndia Bent, GERD, glaucoma  Proair hfa, Breo 200,  Quit theophylline per prior discussion. Still notes occ reflux and has awakened choking, c/w aspiration/ LPR, on Nexium. Realizes reflux makes her asthma worse. Daily productive cough thick white. No blood or fever. Pollen season expected to increase nasal congestion.  Pending Covid vax. We reviewed Xray imaging to discuss interstitial markings.  No longer being followed by [facility] for hx lung nodules.  CXR 03/01/2019- IMPRESSION: 1. Mild diffuse interstitial pulmonary opacity, which may reflect edema and/or chronic interstitial change. No focal airspace opacity. 2. No pulmonary nodules appreciated by radiograph. Recommend CT to follow known or suspected pulmonary nodules.   03/02/20- 64 year old female former smoker followed for asthma/COPD (quit Xolair-cost), allergic rhinitis, complicated by hypertension, history 3 lung nodules - saw Dr. Cyndia Bent, GERD, Glaucoma  Proair hfa, Breo 200, Flonase, Astelin,   -----6 month follow up.  needs refills of breo  Had 2 Phizer Covax Satisfied with her breathing, asking refill Breo. Treating active GERD with Tums and apple cider vinegar. Reviewed CT HRCT chest 09/13/19- IMPRESSION: 1. Mild  air trapping indicative of small airways disease. 2. Multiple small pulmonary nodules measuring 7 mm in diameter or less. The largest of these are stable dating back to 2012, considered benign. Some of the smaller nodules are new compared to the prior study, but are nonspecific in also favored to be benign. No follow-up needed if patient is low-risk (and has no known or suspected primary neoplasm). Non-contrast chest CT can be considered in 12 months if patient is high-risk. This recommendation follows the consensus statement: Guidelines for Management of Incidental Pulmonary Nodules Detected on CT Images: From the Fleischner Society 2017; Radiology 2017; 284:228-243.  ROS- see HPI + = positive Constitutional:   No-   weight loss, night sweats, fevers, chills, fatigue, lassitude. HEENT:    headaches, no-difficulty swallowing, tooth/dental problems, sore throat,     No-sneezing, no- itching, ear ache, nasal congestion, post nasal drip,  CV:  No-chest pain, orthopnea, PND, swelling in lower extremities, anasarca, dizziness, +palpitations Resp: +  shortness of breath with exertion or at rest.            +pproductive cough,    non-productive cough,  No-  coughing up of blood.             No-change in color of mucus.    +wheezing.   Skin: No-   rash or lesions. GI:  No-  heartburn, indigestion, no-abdominal pain, no-nausea, vomiting,  GU:  MS:  No-   joint pain or swelling.   Neuro- nothing unusual Psych:  No- change in mood or affect. + depression or anxiety.  No memory loss.  OBJ General- Alert, Oriented, Affect-appropriate, Distress- none acute Skin- clear Lymphadenopathy- none Head- atraumatic            Eyes- Gross vision intact, PERRLA, conjunctivae clear secretions  Ears- Hearing, canals            Nose- Clear, no-Septal dev, mucus, polyps, erosion, perforation             Throat- Mallampati II , mucosa clear , drainage-none, tonsils- atrophic.  Neck- flexible ,  trachea midline, no stridor , thyroid nl, carotid no bruit Chest - symmetrical excursion , unlabored           Heart/CV- RRR , no murmur , no gallop  , no rub, nl s1 s2                           - JVD- none , edema- none, stasis changes- none, varices- none           Lung-   +distant breath sounds , dullness-none, rub- none, wheezing-none,             cough-none           Chest wall-  Abd-  Br/ Gen/ Rectal- Not done, not indicated Extrem- cyanosis- none, clubbing, none, atrophy- none, strength- nl Neuro- grossly intact to observation

## 2020-03-02 NOTE — Patient Instructions (Signed)
Order- Flu vax- standard  Refill sent for [facility]  We discussed following with a yearly chest xray for now and we can do that next visit.   Please call if we can help

## 2020-03-14 ENCOUNTER — Encounter: Payer: Self-pay | Admitting: Internal Medicine

## 2020-03-14 NOTE — Assessment & Plan Note (Signed)
These are acting inflammatory. To reduce xray exposure and cost, we have agreed to follow with CXR  For the time being, starting with next visit.

## 2020-03-14 NOTE — Assessment & Plan Note (Signed)
Better control. Watch for refux triggered exacerbation. Plan- refill meds, Flu vax

## 2020-03-14 NOTE — Assessment & Plan Note (Signed)
I've asked her to discuss this with PCP. Long hx of symptomatic reflux needing tighter management and probably return to GI.

## 2020-08-01 IMAGING — DX DG CHEST 2V
2 series · 2 of 2 positions shown · non-contrast
Comparison: 06/07/2017

CLINICAL DATA: Asthma, COPD, lung nodules

EXAM:
CHEST - 2 VIEW

[chest pa]
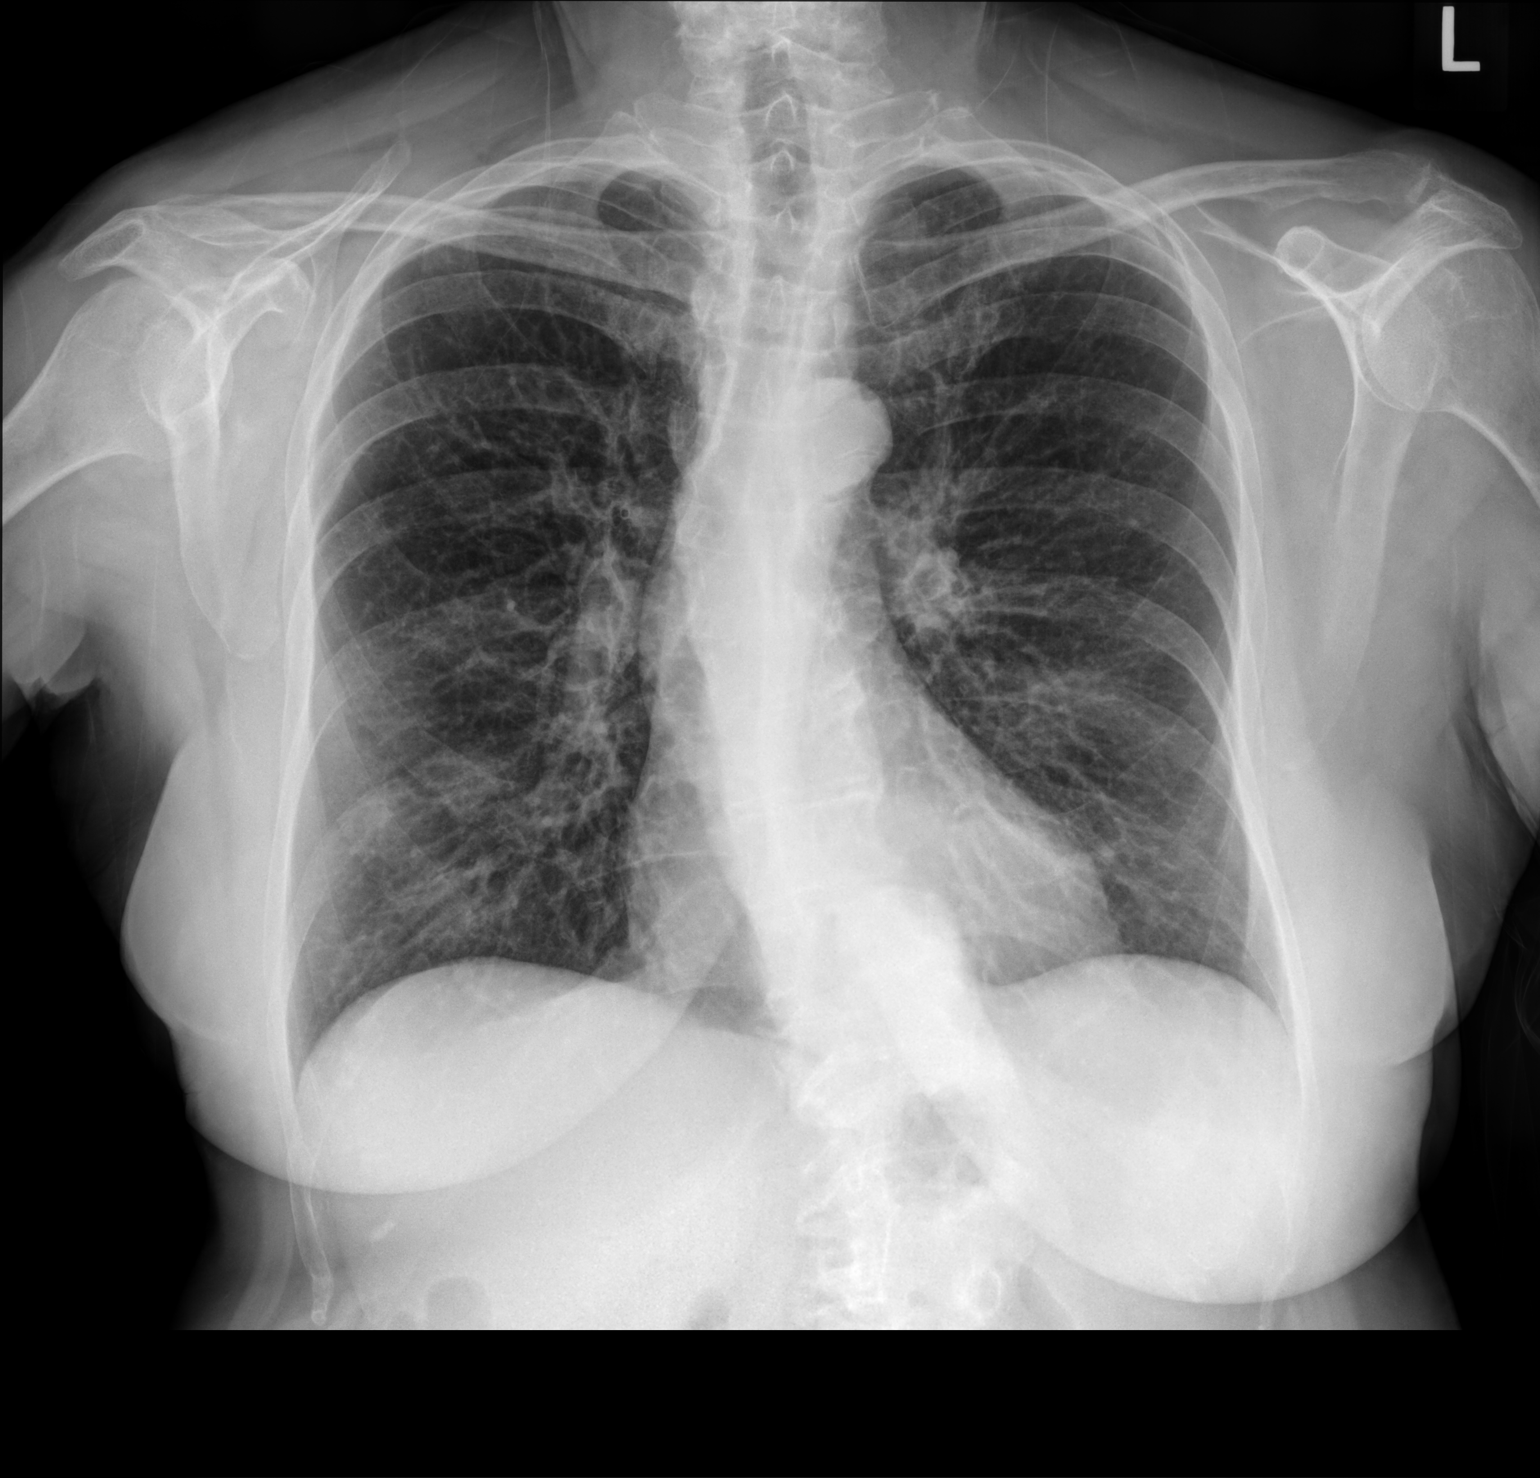

[chest lat]
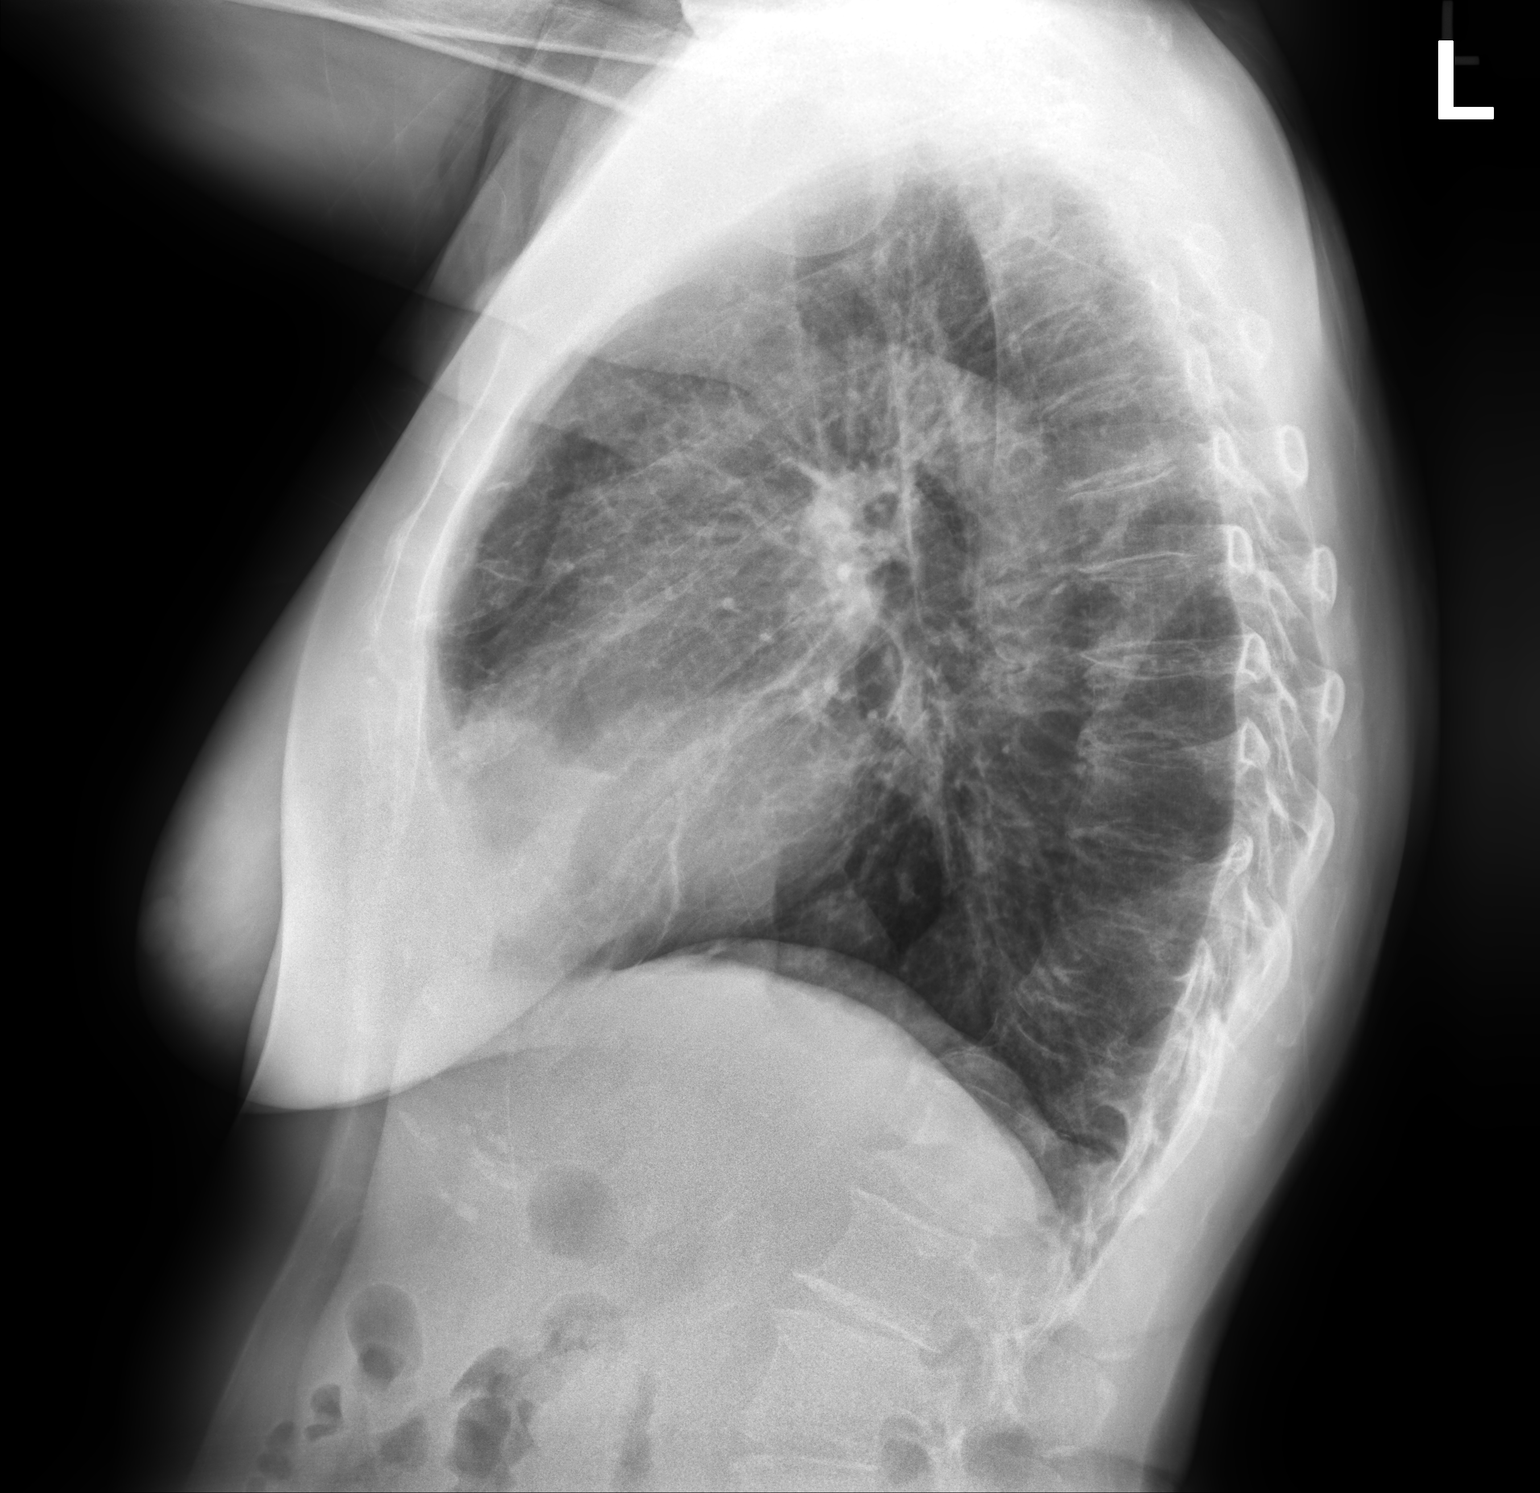

[2 of 2 positions shown; findings below may reference images not displayed]

FINDINGS: The heart size and mediastinal contours are within normal limits.
Mild diffuse interstitial pulmonary opacity. Disc degenerative
disease associated with S shaped scoliosis of the thoracolumbar
spine.
IMPRESSION: 1. Mild diffuse interstitial pulmonary opacity, which may reflect
edema and/or chronic interstitial change. No focal airspace opacity.

2. No pulmonary nodules appreciated by radiograph. Recommend CT to
follow known or suspected pulmonary nodules.

## 2020-08-05 ENCOUNTER — Other Ambulatory Visit: Payer: Self-pay | Admitting: Internal Medicine

## 2020-08-28 NOTE — Progress Notes (Signed)
HPI Female former smoker followed for asthma complicated by hypertension and history of 3 lung nodules followed by Dr. Cyndia Bent.   PFT 01/13/2003-FEV1 2.5 L/92%, FEV1/FVC 0.76 PFT 11/03/14-moderately severe obstructive airways disease with air trapping, normal diffusion capacity, slight response to bronchodilator. FEV1 1.58/64%, FEV1/FVC 0.59, RV 169%, DLCO 93%. Allergy labs 08/05/14- broadly positive mites, cat, dog, grass, tree, weed pollens Total IgE 243, elevated for many foods Eosinophils 6.8%  ----------------------------------------------------- 03/02/20- 65 year old female former smoker followed for asthma/COPD (quit Xolair-cost), allergic rhinitis, complicated by hypertension, history 3 lung nodules - saw Dr. Cyndia Bent, GERD, Glaucoma  Proair hfa, Breo 200, Flonase, Astelin,   -----6 month follow up.  needs refills of breo  Had 2 Phizer Covax Satisfied with her breathing, asking refill Breo. Treating active GERD with Tums and apple cider vinegar. Reviewed CT HRCT chest 09/13/19- IMPRESSION: 1. Mild air trapping indicative of small airways disease. 2. Multiple small pulmonary nodules measuring 7 mm in diameter or less. The largest of these are stable dating back to 2012, considered benign. Some of the smaller nodules are new compared to the prior study, but are nonspecific in also favored to be benign. No follow-up needed if patient is low-risk (and has no known or suspected primary neoplasm). Non-contrast chest CT can be considered in 12 months if patient is high-risk. This recommendation follows the consensus statement: Guidelines for Management of Incidental Pulmonary Nodules Detected on CT Images: From the Fleischner Society 2017; Radiology 2017; [phone].  08/31/20- 65 year old female former smoker followed for asthma/COPD (quit Xolair-cost), allergic Rhinitis, complicated by HTN, history 3 lung nodules - saw Dr. Cyndia Bent, GERD, Glaucoma  -Proair hfa, Breo 200, Flonase, Astelin,    Pending f/u CT chest 3/29 for lung nodules Covid vax-3 Phizer Flu vax-had Avoiding LAMAs due to glaucoma. -----Patient feels good overall, no concerns ACT score 21 No recent flares, little routine cough or wheeze. Using rescue inhaler 1x/ week. Asks refill scripts for [facility]o [facility].  ROS- see HPI + = positive Constitutional:   No-   weight loss, night sweats, fevers, chills, fatigue, lassitude. HEENT:    headaches, no-difficulty swallowing, tooth/dental problems, sore throat,     No-sneezing, no- itching, ear ache, nasal congestion, post nasal drip,  CV:  No-chest pain, orthopnea, PND, swelling in lower extremities, anasarca, dizziness, +palpitations Resp: +  shortness of breath with exertion or at rest.            productive cough,    non-productive cough,  No-  coughing up of blood.             No-change in color of mucus.    +wheezing.   Skin: No-   rash or lesions. GI:  No-  heartburn, indigestion, no-abdominal pain, no-nausea, vomiting,  GU:  MS:  No-   joint pain or swelling.   Neuro- nothing unusual Psych:  No- change in mood or affect. + depression or anxiety.  No memory loss.        //consider a1AT assay, f/u PFT//  OBJ General- Alert, Oriented, Affect-appropriate, Distress- none acute Skin- clear Lymphadenopathy- none Head- atraumatic            Eyes- Gross vision intact, PERRLA, conjunctivae clear secretions            Ears- Hearing, canals            Nose- Clear, no-Septal dev, mucus, polyps, erosion, perforation  Throat- Mallampati II , mucosa clear , drainage-none, tonsils- atrophic.  Neck- flexible , trachea midline, no stridor , thyroid nl, carotid no bruit Chest - symmetrical excursion , unlabored           Heart/CV- RRR , no murmur , no gallop  , no rub, nl s1 s2                           - JVD- none , edema- none, stasis changes- none, varices- none           Lung-   +distant breath sounds , clear, dullness-none,  rub- none, wheezing-none,             cough-none           Chest wall-  Abd-  Br/ Gen/ Rectal- Not done, not indicated Extrem- cyanosis- none, clubbing, none, atrophy- none, strength- nl Neuro- grossly intact to observation

## 2020-08-31 ENCOUNTER — Other Ambulatory Visit: Payer: Self-pay

## 2020-08-31 ENCOUNTER — Ambulatory Visit (INDEPENDENT_AMBULATORY_CARE_PROVIDER_SITE_OTHER): Payer: 59 | Admitting: Internal Medicine

## 2020-08-31 ENCOUNTER — Encounter: Payer: Self-pay | Admitting: Internal Medicine

## 2020-08-31 DIAGNOSIS — J449 Chronic obstructive pulmonary disease, unspecified: Secondary | ICD-10-CM

## 2020-08-31 DIAGNOSIS — R918 Other nonspecific abnormal finding of lung field: Secondary | ICD-10-CM | POA: Diagnosis not present

## 2020-08-31 MED ORDER — BREO ELLIPTA 200-25 MCG/INH IN AEPB: INHALATION_SPRAY | RESPIRATORY_TRACT | 12 refills | Status: DC

## 2020-08-31 MED ORDER — BREO ELLIPTA 200-25 MCG/INH IN AEPB: INHALATION_SPRAY | RESPIRATORY_TRACT | 4 refills | Status: DC

## 2020-08-31 NOTE — Assessment & Plan Note (Signed)
Some new small nodules noted on last check.  Plan- CT as scheduled 3/29. After that,if stable, we may be able to follow with CXR.

## 2020-08-31 NOTE — Assessment & Plan Note (Signed)
We are quite pleased with her control on Breo 200.  On review, may want to update PFT and check a1AT assay in future Plan- refilling Breo.

## 2020-08-31 NOTE — Patient Instructions (Signed)
Breo refills sent to [facility] and [facility]  We will calyou after we get the report of your upcoming CT scan, tracking the small lung nodules.  Please call if we can help

## 2020-09-02 ENCOUNTER — Other Ambulatory Visit: Payer: Self-pay | Admitting: Family Medicine

## 2020-09-02 DIAGNOSIS — Z1231 Encounter for screening mammogram for malignant neoplasm of breast: Secondary | ICD-10-CM

## 2020-09-15 ENCOUNTER — Ambulatory Visit (INDEPENDENT_AMBULATORY_CARE_PROVIDER_SITE_OTHER)
Admission: RE | Admit: 2020-09-15 | Discharge: 2020-09-15 | Disposition: A | Discharge status: Home / Self Care | Payer: 59 | Source: Ambulatory Visit | Attending: Internal Medicine | Admitting: Internal Medicine

## 2020-09-15 ENCOUNTER — Other Ambulatory Visit: Payer: Self-pay

## 2020-09-15 DIAGNOSIS — R918 Other nonspecific abnormal finding of lung field: Secondary | ICD-10-CM

## 2021-05-05 ENCOUNTER — Telehealth: Payer: Self-pay | Admitting: Internal Medicine

## 2021-05-05 NOTE — Telephone Encounter (Signed)
Please send prednisone 10 mg, # 20, 4 X 2 DAYS, 3 X 2 DAYS, 2 X 2 DAYS, 1 X 2 DAYS

## 2021-05-05 NOTE — Telephone Encounter (Signed)
LMTCB  Medication has been pended. Please confirm pharmacy.

## 2021-05-05 NOTE — Telephone Encounter (Signed)
Call made to patient, confirmed DOB. Patient reports increased SOB that started 2 days ago. She reports she is moving and the stirring up of the dust she thinks it is aggravating her asthma. She confirms she is using Breo daily. She is using her albuterol inhaler 2-3x/day. Reports a dry cough but just voices throat irritation. Denies fevers, sweats, chills, or body aches.   CY please advise. Patient requesting prednisone. Thanks :)

## 2021-05-06 MED ORDER — PREDNISONE 10 MG PO TABS: ORAL_TABLET | ORAL | 0 refills | Status: DC

## 2021-05-06 NOTE — Telephone Encounter (Signed)
Medication sent to pharmacy of choice.   Nothing further needed at this time.

## 2021-05-06 NOTE — Telephone Encounter (Signed)
Rx was not received by pharmacy. Rx was resent to [facility]. Left detailed message explaining where medication was called into (per DPR). Nothing further needed at this time.

## 2021-05-06 NOTE — Addendum Note (Signed)
Addended by: Gavin Potters R on: 05/06/2021 02:58 PM   Modules accepted: Orders

## 2021-05-06 NOTE — Telephone Encounter (Signed)
LMTCB

## 2021-07-29 ENCOUNTER — Other Ambulatory Visit: Payer: Self-pay | Admitting: Family Medicine

## 2021-07-29 DIAGNOSIS — Z803 Family history of malignant neoplasm of breast: Secondary | ICD-10-CM

## 2021-08-20 ENCOUNTER — Ambulatory Visit
Admission: RE | Admit: 2021-08-20 | Discharge: 2021-08-20 | Disposition: A | Discharge status: Home / Self Care | Payer: 59 | Source: Ambulatory Visit | Attending: Family Medicine | Admitting: Family Medicine

## 2021-08-20 DIAGNOSIS — Z803 Family history of malignant neoplasm of breast: Secondary | ICD-10-CM

## 2021-08-20 MED ORDER — GADOBUTROL 1 MMOL/ML IV SOLN
7.0000 mL | Freq: Once | INTRAVENOUS | Status: AC | PRN
  Administered 2021-08-20: 7 mL via INTRAVENOUS

## 2021-08-24 ENCOUNTER — Other Ambulatory Visit: Payer: Self-pay | Admitting: Family Medicine

## 2021-08-25 ENCOUNTER — Other Ambulatory Visit: Payer: Self-pay | Admitting: Family Medicine

## 2021-08-25 DIAGNOSIS — R9389 Abnormal findings on diagnostic imaging of other specified body structures: Secondary | ICD-10-CM

## 2021-08-29 NOTE — Progress Notes (Unsigned)
HPI Female former smoker followed for asthma complicated by hypertension and history of 3 lung nodules followed by Dr. Cyndia Bent.   PFT 01/13/2003-FEV1 2.5 L/92%, FEV1/FVC 0.76 PFT 11/03/14-moderately severe obstructive airways disease with air trapping, normal diffusion capacity, slight response to bronchodilator. FEV1 1.58/64%, FEV1/FVC 0.59, RV 169%, DLCO 93%. Allergy labs 08/05/14- broadly positive mites, cat, dog, grass, tree, weed pollens Total IgE 243, elevated for many foods Eosinophils 6.8%  -----------------------------------------------------   08/31/20- 66 year old female former smoker followed for asthma/COPD (quit Xolair-cost), allergic Rhinitis, complicated by HTN, history 3 lung nodules - saw Dr. Cyndia Bent, GERD, Glaucoma  -Proair hfa, Breo 200, Flonase, Astelin,   Pending f/u CT chest 3/29 for lung nodules Covid vax-3 Phizer Flu vax-had Avoiding LAMAs due to glaucoma. -----Patient feels good overall, no concerns ACT score 21 No recent flares, little routine cough or wheeze. Using rescue inhaler 1x/ week. Asks refill scripts for Breo 200 to both [facility] and [facility].  08/31/21- 66 year old female former smoker followed for asthma/COPD (quit Xolair-cost), allergic Rhinitis, complicated by HTN, history 3 lung nodules - saw Dr. Cyndia Bent, GERD, Glaucoma  -Proair hfa, Breo 200, Flonase, Astelin,   Covid vax-3 Phizer Flu vax- Avoiding LAMAs due to glaucoma  CT chest 09/15/20 IMPRESSION: 1. Hiatal hernia extending into the chest. 2. Stable nodules, largest of which are stable since 2012 in the smaller nodules which are unchanged over 1 year interval compatible with benign pulmonary nodules  ROS- see HPI + = positive Constitutional:   No-   weight loss, night sweats, fevers, chills, fatigue, lassitude. HEENT:    headaches, no-difficulty swallowing, tooth/dental problems, sore throat,     No-sneezing, no- itching, ear ache, nasal congestion, post nasal drip,  CV:  No-chest  pain, orthopnea, PND, swelling in lower extremities, anasarca, dizziness, +palpitations Resp: +  shortness of breath with exertion or at rest.            productive cough,    non-productive cough,  No-  coughing up of blood.             No-change in color of mucus.    +wheezing.   Skin: No-   rash or lesions. GI:  No-  heartburn, indigestion, no-abdominal pain, no-nausea, vomiting,  GU:  MS:  No-   joint pain or swelling.   Neuro- nothing unusual Psych:  No- change in mood or affect. + depression or anxiety.  No memory loss.        //consider a1AT assay, f/u PFT//  OBJ General- Alert, Oriented, Affect-appropriate, Distress- none acute Skin- clear Lymphadenopathy- none Head- atraumatic            Eyes- Gross vision intact, PERRLA, conjunctivae clear secretions            Ears- Hearing, canals            Nose- Clear, no-Septal dev, mucus, polyps, erosion, perforation             Throat- Mallampati II , mucosa clear , drainage-none, tonsils- atrophic.  Neck- flexible , trachea midline, no stridor , thyroid nl, carotid no bruit Chest - symmetrical excursion , unlabored           Heart/CV- RRR , no murmur , no gallop  , no rub, nl s1 s2                           - JVD- none , edema- none, stasis changes- none, varices- none  Lung-   +distant breath sounds , clear, dullness-none, rub- none, wheezing-none,             cough-none           Chest wall-  Abd-  Br/ Gen/ Rectal- Not done, not indicated Extrem- cyanosis- none, clubbing, none, atrophy- none, strength- nl Neuro- grossly intact to observation

## 2021-08-31 ENCOUNTER — Ambulatory Visit (INDEPENDENT_AMBULATORY_CARE_PROVIDER_SITE_OTHER): Payer: 59 | Admitting: Internal Medicine

## 2021-08-31 ENCOUNTER — Encounter: Payer: Self-pay | Admitting: Internal Medicine

## 2021-08-31 ENCOUNTER — Other Ambulatory Visit: Payer: Self-pay

## 2021-08-31 DIAGNOSIS — J449 Chronic obstructive pulmonary disease, unspecified: Secondary | ICD-10-CM

## 2021-08-31 DIAGNOSIS — K219 Gastro-esophageal reflux disease without esophagitis: Secondary | ICD-10-CM | POA: Diagnosis not present

## 2021-08-31 DIAGNOSIS — R918 Other nonspecific abnormal finding of lung field: Secondary | ICD-10-CM | POA: Diagnosis not present

## 2021-08-31 MED ORDER — FLUTICASONE FUROATE-VILANTEROL 100-25 MCG/ACT IN AEPB: 1.0000 | INHALATION_SPRAY | Freq: Every day | RESPIRATORY_TRACT | 4 refills | Status: DC

## 2021-08-31 MED ORDER — FLUTICASONE FUROATE-VILANTEROL 100-25 MCG/ACT IN AEPB: 1.0000 | INHALATION_SPRAY | Freq: Every day | RESPIRATORY_TRACT | 0 refills | Status: DC

## 2021-08-31 MED ORDER — FLUTICASONE FUROATE-VILANTEROL 100-25 MCG/ACT IN AEPB: 1.0000 | INHALATION_SPRAY | Freq: Every day | RESPIRATORY_TRACT | 12 refills | Status: DC

## 2021-08-31 NOTE — Assessment & Plan Note (Signed)
Moderate persistent uncomplicated, well controlled.  We can continue current therapy. ?Plan-refill Breo ?

## 2021-08-31 NOTE — Assessment & Plan Note (Signed)
Chronic reflux.  We discussed management, reflux precautions.  Do not know if she would be a surgical candidate for repair of hiatal hernia and suggested she bring the issue up with her primary physician to discuss. ?

## 2021-08-31 NOTE — Assessment & Plan Note (Signed)
She may be a candidate for low-dose chest CT surveillance program ?Plan-referral for consideration ?

## 2021-08-31 NOTE — Patient Instructions (Signed)
Breo refills sent to [facility] and to [facility] ? ?You can ask Dr Lindell Noe about management of your hiatal hernia. ? ?Order- referral to Low Dose Chest CT screening program   Groce NP ? ?Please call if we can help ?

## 2021-09-01 ENCOUNTER — Ambulatory Visit
Admission: RE | Admit: 2021-09-01 | Discharge: 2021-09-01 | Disposition: A | Discharge status: Home / Self Care | Payer: 59 | Source: Ambulatory Visit | Attending: Family Medicine | Admitting: Family Medicine

## 2021-09-01 ENCOUNTER — Other Ambulatory Visit (HOSPITAL_COMMUNITY): Payer: Self-pay | Admitting: Diagnostic Radiology

## 2021-09-01 ENCOUNTER — Other Ambulatory Visit (HOSPITAL_COMMUNITY): Payer: Self-pay

## 2021-09-01 DIAGNOSIS — R9389 Abnormal findings on diagnostic imaging of other specified body structures: Secondary | ICD-10-CM

## 2021-09-01 HISTORY — PX: BREAST BIOPSY: SHX20

## 2021-09-01 MED ORDER — GADOBUTROL 1 MMOL/ML IV SOLN
7.0000 mL | Freq: Once | INTRAVENOUS | Status: AC | PRN
  Administered 2021-09-01: 7 mL via INTRAVENOUS

## 2021-09-03 ENCOUNTER — Other Ambulatory Visit: Payer: 59

## 2021-09-08 ENCOUNTER — Other Ambulatory Visit (HOSPITAL_COMMUNITY): Payer: Self-pay | Admitting: Diagnostic Radiology

## 2021-09-08 ENCOUNTER — Ambulatory Visit
Admission: RE | Admit: 2021-09-08 | Discharge: 2021-09-08 | Disposition: A | Discharge status: Home / Self Care | Payer: 59 | Source: Ambulatory Visit | Attending: Family Medicine | Admitting: Family Medicine

## 2021-09-08 ENCOUNTER — Other Ambulatory Visit: Payer: Self-pay

## 2021-09-08 DIAGNOSIS — R9389 Abnormal findings on diagnostic imaging of other specified body structures: Secondary | ICD-10-CM

## 2021-09-08 MED ORDER — GADOBUTROL 1 MMOL/ML IV SOLN
6.0000 mL | Freq: Once | INTRAVENOUS | Status: AC | PRN
  Administered 2021-09-08: 6 mL via INTRAVENOUS

## 2021-09-10 ENCOUNTER — Other Ambulatory Visit: Payer: Self-pay | Admitting: Surgery

## 2021-09-14 ENCOUNTER — Telehealth: Payer: Self-pay | Admitting: Hematology and Oncology

## 2021-09-14 ENCOUNTER — Telehealth: Payer: Self-pay | Admitting: Genetic Counselor

## 2021-09-14 NOTE — Progress Notes (Incomplete)
New Breast Cancer Diagnosis: Bilateral Breast ? ?Did patient present with symptoms (if so, please note symptoms) or screening mammography?: Patient reports she was having increasing breast pain in the left breast.  Screening MRI showed 2 masses in the right breast and 2 masses in the left breast.  ? ?Location and Extent of disease :Bilateral breast.  ? 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 ?Receptor Status: ER(positive), PR (positive), Her2-neu (negative), Ki-(5%) ? ? ?Left Breast ?Histology per Pathology Report: grade 2, Invasive Mammary Carcinoma 09/01/2021 ? ?Receptor Status: ER(positive), PR (positive), Her2-neu (negative), Ki-(20%) ? ? ? ?Surgeon and surgical plan, if any: ?Dr. Ninfa Linden 09/10/2021 ? -Although I do not know the pathology on the right breast it appears she has bilateral breast cancer.  ?-We discussed both breast conservation and mastectomies. We discussed the reasons for this of both. We discussed radiation therapy if breast conservation is chosen. Unfortunately, I need to know the final pathology in the right breast before deciding whether or not she is a candidate for lumpectomy on that side.  ?-She will need bilateral sentinel node biopsies as well. We had a long discussion regarding all the surgical options.  ?-Should she choose mastectomy bilaterally, she would like to meet with plastic surgeons preoperatively to discuss whether or not she would have reconstruction at the moment.  ?-I will call her back as it is under the final pathology on the right breast and will continue to determine how to proceed. Referrals will be placed. ? ?Plastic Surgeon ?Dr. Claudia Desanctis 09/15/2021 ? ?Medical oncologist, treatment if any:   ?Dr. Lindi Adie 09/23/2021 ? ? ?Family History of Breast/Ovarian/Prostate Cancer: Paternal aunt. ? ? ?Lymphedema issues, if any:  {:18581} {t:21944}  ? ?Pain issues, if any:  {:18581} {PAIN DESCRIPTION:[id number]} ? ?SAFETY  ISSUES: ?Prior radiation? {:18581} ?Pacemaker/ICD? {:18581} ?Possible current pregnancy?{:18581} ?Is the patient on methotrexate? {:18581} ? ?Current Complaints / other details:   ?Genetics 09/15/2021 ? ?

## 2021-09-14 NOTE — Telephone Encounter (Signed)
Scheduled appt per 3/27 referral. Pt is aware of appt date and time. Pt is aware to arrive 15 mins prior to appt time and to bring and updated insurance card. Pt is aware of appt location.   ?

## 2021-09-14 NOTE — Telephone Encounter (Signed)
Scheduled appt per 3/28 referral. Pt is aware of appt date and time. Pt is aware to arrive 15 mins prior to appt time and to bring and updated insurance card. Pt is aware of appt location.   ?

## 2021-09-15 ENCOUNTER — Encounter: Payer: Self-pay | Admitting: Physical Therapy

## 2021-09-15 ENCOUNTER — Other Ambulatory Visit: Payer: Self-pay

## 2021-09-15 ENCOUNTER — Ambulatory Visit: Payer: 59 | Attending: Surgery | Admitting: Physical Therapy

## 2021-09-15 ENCOUNTER — Ambulatory Visit (INDEPENDENT_AMBULATORY_CARE_PROVIDER_SITE_OTHER): Payer: 59 | Admitting: Plastic Surgery

## 2021-09-15 DIAGNOSIS — C50911 Malignant neoplasm of unspecified site of right female breast: Secondary | ICD-10-CM | POA: Diagnosis present

## 2021-09-15 DIAGNOSIS — C50912 Malignant neoplasm of unspecified site of left female breast: Secondary | ICD-10-CM | POA: Insufficient documentation

## 2021-09-15 DIAGNOSIS — R293 Abnormal posture: Secondary | ICD-10-CM | POA: Diagnosis present

## 2021-09-15 NOTE — Therapy (Signed)
?OUTPATIENT PHYSICAL THERAPY BREAST CANCER BASELINE EVALUATION ? ? ?Patient Name: Kelsey Duran ?MRN: [id number] ?DOB:11-Feb-1956, 66 y.o., female ?Today's Date: 09/15/2021 ? ? PT End of Session - 09/15/21 0854   ? ? Visit Number 1   ? Number of Visits 2   ? Date for PT Re-Evaluation 11/10/21   ? PT Start Time 0813   pt arrived late  ? PT Stop Time 0853   ? PT Time Calculation (min) 40 min   ? Activity Tolerance Patient tolerated treatment well   ? Behavior During Therapy [facility] for tasks assessed/performed   ? ?  ?  ? ?  ? ? ?Past Medical History:  ?Diagnosis Date  ? ASTHMA 09/01/2009  ? HYPERTENSION 09/08/2009  ? LUNG NODULE 09/08/2009  ? ?Past Surgical History:  ?Procedure Laterality Date  ? ABDOMINAL HYSTERECTOMY    ? BREAST BIOPSY Left 09/01/2021  ? BUNIONECTOMY    ? TONSILLECTOMY    ? ?Patient Active Problem List  ? Diagnosis Date Noted  ? Seasonal and perennial allergic rhinitis 05/23/2012  ? Food allergy 05/23/2012  ? GERD (gastroesophageal reflux disease) 05/06/2012  ? Chest pain 04/25/2012  ? Dyspnea 04/25/2012  ? HYPERTENSION 09/08/2009  ? Lung nodules 09/08/2009  ? Asthma with COPD ([place]) 09/01/2009  ? ? ?PCP: Glenis Smoker, MD ? ?REFERRING PROVIDER: Coralie Keens, MD ? ?REFERRING DIAG: C50.911 (ICD-10-CM) - Malignant neoplasm of unspecified site of right female breast C50.912 (ICD-10-CM) - Malignant neoplasm of unspecified site of left female breast  ? ?THERAPY DIAG:  ?Abnormal posture ? ?Malignant neoplasm of right female breast, unspecified estrogen receptor status, unspecified site of breast ([place]) ? ?Malignant neoplasm of left female breast, unspecified estrogen receptor status, unspecified site of breast ([place]) ? ?ONSET DATE: 09/01/21 ? ?SUBJECTIVE                                                                                                                                                                                          ? ?SUBJECTIVE STATEMENT: ?Patient reports she is here today to be seen  by her medical team for her newly diagnosed bilateral breast cancer.  ? ?PERTINENT HISTORY:  ?Patient was diagnosed on 09/01/21 with bilateral breast cancer. There are 2 masses in the right breast and 2 masses in the left breast. The larger mass in the left breast measured 2 cm and the smaller was 4 mm. The left breast mass was biopsied showing an invasive ductal carcinoma the larger mass which was 100% ER positive, 95% PR positive, HER2 negative, and had a Ki-67 of 20%. The other mass in the left breast demonstrated fibrocystic changes. Still waiting  on pathology from the R breast biopsy.  ? ?PATIENT GOALS   reduce lymphedema risk and learn post op HEP.  ? ?PAIN:  ?Are you having pain? No ? ? ?PRECAUTIONS: Active CA  ? ?HAND DOMINANCE: right ? ?WEIGHT BEARING RESTRICTIONS No ? ?FALLS:  ?Has patient fallen in last 6 months? No ? ?LIVING ENVIRONMENT: ?Patient lives with: alone ?Lives in: House/apartment ?Has following equipment at home: None ? 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 ?LEISURE: pt does not exercise ? ?PRIOR LEVEL OF FUNCTION: Independent ? ? ?OBJECTIVE ? ?COGNITION: ? Overall cognitive status: Within functional limits for tasks assessed   ? ?POSTURE:  ?Forward head and rounded shoulders posture ? ?UPPER EXTREMITY AROM/PROM: ? ?A/PROM RIGHT  09/15/2021 ?  ?Shoulder extension 69  ?Shoulder flexion 159  ?Shoulder abduction 172  ?Shoulder internal rotation 72  ?Shoulder external rotation 89  ?  (Blank rows = not tested) ? ?A/PROM LEFT  09/15/2021  ?Shoulder extension 65  ?Shoulder flexion 170  ?Shoulder abduction 174  ?Shoulder internal rotation 78  ?Shoulder external rotation 90  ?  (Blank rows = not tested) ? ? ?CERVICAL AROM: ?All within normal limits:  ? ? Percent limited  ?Flexion WFL  ?Extension WFL  ?Right lateral flexion WFL  ?Left lateral flexion WFL  ?Right rotation WFL  ?Left rotation WFL  ? ? ? ?UPPER EXTREMITY STRENGTH: grossly 5/5 ? ? ?LYMPHEDEMA ASSESSMENTS:   ? ?LANDMARK RIGHT  09/15/2021  ?10 cm proximal to olecranon process 24.5  ?Olecranon process 23  ?10 cm proximal to ulnar styloid process 20.5  ?Just proximal to ulnar styloid process 15  ?Across hand at thumb web space 17.5  ?At base of 2nd digit 5.9  ?(Blank rows = not tested) ? ?[place] LEFT  09/15/2021  ?10 cm proximal to olecranon process 24.6  ?Olecranon process 22.5  ?10 cm proximal to ulnar styloid process 20  ?Just proximal to ulnar styloid process 14.7  ?Across hand at thumb web space 18.8  ?At base of 2nd digit 5.7  ?(Blank rows = not tested) ? ? ?L-DEX LYMPHEDEMA SCREENING: ? ?The patient was assessed using the L-Dex machine today to produce a lymphedema index baseline score. The patient will be reassessed on a regular basis (typically every 3 months) to obtain new L-Dex scores. If the score is > 6.5 points away from his/her baseline score indicating onset of subclinical lymphedema, it will be recommended to wear a compression garment for 4 weeks, 12 hours per day and then be reassessed. If the score continues to be > 6.5 points from baseline at reassessment, we will initiate lymphedema treatment. Assessing in this manner has a 95% rate of preventing clinically significant lymphedema. ? ? L-DEX FLOWSHEETS - 09/15/21 0800   ? ?  ? L-DEX LYMPHEDEMA SCREENING  ? Measurement Type Bilateral   ? L-DEX MEASUREMENT EXTREMITY Upper Extremity   ? POSITION  Standing   ? DOMINANT SIDE Right   ? At Risk Side Right   and L  ? BASELINE RIGHT 2.8   ? BASELINE LEFT 0.1   ? ?  ?  ? ?  ? ? ? ?QUICK DASH SURVEY:  ? Katina Dung - 09/15/21 0001   ? ? Open a tight or new jar Mild difficulty   ? Do heavy household chores (wash walls, wash floors) Mild difficulty   ? Carry a shopping bag or briefcase No difficulty   ? Wash your back No difficulty   ?  Use a knife to cut food No difficulty   ? Recreational activities in which you take some force or impact through your arm, shoulder, or hand (golf, hammering, tennis) No difficulty    ? During the past week, to what extent has your arm, shoulder or hand problem interfered with your normal social activities with family, friends, neighbors, or groups? Not at all   ? During the past week, to what extent has your arm, shoulder or hand problem limited your work or other regular daily activities Not at all   ? Arm, shoulder, or hand pain. None   ? Tingling (pins and needles) in your arm, shoulder, or hand None   ? Difficulty Sleeping No difficulty   ? DASH Score 4.55 %   ? ?  ?  ? ?  ? ? ? ? ?PATIENT EDUCATION:  ?Education details: Lymphedema risk reduction and post op shoulder/posture HEP ?Person educated: Patient ?Education method: Explanation, Demonstration, Handout ?Education comprehension: Patient verbalized understanding and returned demonstration ? ? ?HOME EXERCISE PROGRAM: ?Patient was instructed today in a home exercise program today for post op shoulder range of motion. These included active assist shoulder flexion in sitting, scapular retraction, wall walking with shoulder abduction, and hands behind head external rotation.  She was encouraged to do these twice a day, holding 3 seconds and repeating 5 times when permitted by her physician. ? ? ?ASSESSMENT: ? ?CLINICAL IMPRESSION: ?Pt presents to PT with recently diagnosed bilateral breast cancer. She is unsure if she will pursue bilateral lumpectomies or mastectomies. Currently she is planning to get a second opinion before deciding. Her shoulder and neck ROM are WFL. She will benefit from a post op PT reassessment to determine needs and from L-Dex screens every 3 months for 2 years to detect subclinical lymphedema. ? ?Pt will benefit from skilled therapeutic intervention to improve on the following deficits: Decreased knowledge of precautions, impaired UE functional use, pain, decreased ROM, postural dysfunction.  ? ?PT treatment/interventions: ADL/self-care home management, pt/family education, therapeutic exercise ? ?REHAB POTENTIAL:  Good ? ?CLINICAL DECISION MAKING: Stable/uncomplicated ? ?EVALUATION COMPLEXITY: Low ? ? ?GOALS: ?Goals reviewed with patient? YES ? ?LONG TERM GOALS: (STG=LTG) ? ? Name Target Date Goal status  ?1 Pt w

## 2021-09-15 NOTE — Progress Notes (Signed)
? ?Referring Provider ?Glenis Smoker, MD ?[address] ?[place],   [id number]  ? ?CC:  ?Chief Complaint  ?Patient presents with  ? Advice Only  ?   ? ?Kelsey Duran is an 66 y.o. female.  ?HPI: Patient presents to discuss breast reconstruction.  She was recently found to have some suspicious lesions seen on screening MRI.  She has been diagnosed with bilateral breast cancer with a mass on the left side being 2 cm in size.  She is still working out the exact surgical plan but has been leaning towards bilateral mastectomies to avoid need for future biopsies.  She is still has upcoming appointments with radiation oncology.  She would like to discuss her reconstructive options. ? ?Allergies  ?Allergen Reactions  ? Biaxin [Clarithromycin]   ?  GI UPSET  ? Codeine Other (See Comments)  ? Penicillins   ? Tetracycline   ? ? ?Outpatient Encounter Medications as of 09/15/2021  ?Medication Sig Note  ? albuterol (PROAIR HFA) 108 (90 Base) MCG/ACT inhaler Inhale 2 puffs every 6 hours as needed   ? ALPRAZolam (XANAX) 0.5 MG tablet Take 1 tablet (0.5 mg total) by mouth 2 (two) times daily as needed.   ? azelastine (ASTELIN) 0.1 % nasal spray USE 1 TO 2 SPRAYS IN EACH NOSTRIL ONCE DAILY AT BEDTIME   ? calcium carbonate (OS-CAL) 600 MG TABS tablet Take 600 mg by mouth 2 (two) times daily with a meal.   ? clobetasol (TEMOVATE) 0.05 % cream Apply topically 2 (two) times daily.   ? Cyanocobalamin (VITAMIN B 12 PO) Take by mouth.   ? fluticasone (FLONASE) 50 MCG/ACT nasal spray 1 puff each nostril twice daiy   ? fluticasone furoate-vilanterol (BREO ELLIPTA) 100-25 MCG/ACT AEPB Inhale 1 puff into the lungs daily.   ? latanoprost (XALATAN) 0.005 % ophthalmic solution  02/04/2016: Received from: External Pharmacy  ? magnesium oxide (MAG-OX) 400 MG tablet Take 400 mg by mouth daily.   ? Multiple Vitamin (MULTIVITAMIN) tablet Take 1 tablet by mouth daily.   ? NONFORMULARY OR COMPOUNDED ITEM Allergy Vaccine ?1:10 ?Given at  [facility]   ? olmesartan-hydrochlorothiazide (BENICAR HCT) 20-12.5 MG tablet    ? Omega-3 Fatty Acids (FISH OIL) 1000 MG CPDR Take 1 capsule by mouth daily.   ? [DISCONTINUED] fluticasone furoate-vilanterol (BREO ELLIPTA) 100-25 MCG/ACT AEPB Inhale 1 puff into the lungs daily. (Patient not taking: Reported on 09/15/2021)   ? ?No facility-administered encounter medications on file as of 09/15/2021.  ?  ? ?Past Medical History:  ?Diagnosis Date  ? ASTHMA 09/01/2009  ? HYPERTENSION 09/08/2009  ? LUNG NODULE 09/08/2009  ? ? ?Past Surgical History:  ?Procedure Laterality Date  ? ABDOMINAL HYSTERECTOMY    ? BREAST BIOPSY Left 09/01/2021  ? BUNIONECTOMY    ? TONSILLECTOMY    ? ? ?Family History  ?Problem Relation Age of Onset  ? Heart attack Mother   ? Pancreatitis Father   ? Stroke Sister   ? ? ?Social History  ? ?Social History Narrative  ? Not on file  ?  ? ?Review of Systems ?General: Denies fevers, chills, weight loss ?CV: Denies chest pain, shortness of breath, palpitations ? ?Physical Exam ? ?  08/31/2021  ?  9:33 AM 08/31/2020  ?  9:15 AM 03/02/2020  ?  9:39 AM  ?Vitals with BMI  ?Height '5\' 2"'$  '5\' 3"'$    ?Weight 143 lbs 144 lbs 146 lbs 6 oz  ?BMI 26.15 25.51   ?Systolic 812  110 118  ?Diastolic 68 62 72  ?Pulse 71 78 80  ?  ?General:  No acute distress,  Alert and oriented, Non-Toxic, Normal speech and affect ?Breast: She has grade 3 ptosis.  She is reasonably symmetric.  No obvious scars.  Base width 12 cm ? ?Assessment/Plan ?Patient is a good candidate for immediate breast reconstruction.  We discussed autologous and implant-based but ultimately focused on implant-based due to my recommendations and her preferences.  Due to her ptosis I do not believe that she is a good candidate for nipple sparing mastectomy.  For the reconstruction we discussed staged reconstruction with placement of a tissue expander which was subsequently bits would be switched out to a gel implant.  We discussed risks include bleeding,  infection, damage to surrounding structures and need for additional procedures.  We discussed potential complications and wound healing or infection that would result in loss of the expander or implant.  We discussed the need for drains postoperatively.  All of her questions were answered.  She would still like to think about her exact approach and I believe she will talk to Dr. Ninfa Linden again.  I will be available to help with reconstruction if she ends up wanting to go that direction. ? ?Cindra Presume ?09/15/2021, 6:04 PM  ? ? ?  ?

## 2021-09-16 ENCOUNTER — Telehealth: Payer: Self-pay | Admitting: Radiation Oncology

## 2021-09-16 ENCOUNTER — Ambulatory Visit: Payer: 59

## 2021-09-16 ENCOUNTER — Inpatient Hospital Stay: Payer: 59

## 2021-09-16 ENCOUNTER — Telehealth: Payer: Self-pay | Admitting: Genetic Counselor

## 2021-09-16 ENCOUNTER — Ambulatory Visit: Payer: 59 | Admitting: Radiation Oncology

## 2021-09-16 ENCOUNTER — Inpatient Hospital Stay: Payer: 59 | Admitting: Genetic Counselor

## 2021-09-16 NOTE — Telephone Encounter (Signed)
3/30 @ 8:16 am Left voicemail for patient to call our office to reschedule her appointment.   ?

## 2021-09-16 NOTE — Telephone Encounter (Signed)
R/s pt's genetics appt per 3/30 secure chat with Roma Kayser - pt has some car trouble this morning. Called pt, no answer. Left msg with new appt date and time. Requested for pt to call back to confirm appt change.  ?

## 2021-09-20 DIAGNOSIS — C50812 Malignant neoplasm of overlapping sites of left female breast: Secondary | ICD-10-CM | POA: Insufficient documentation

## 2021-09-20 DIAGNOSIS — C50811 Malignant neoplasm of overlapping sites of right female breast: Secondary | ICD-10-CM | POA: Insufficient documentation

## 2021-09-20 NOTE — Progress Notes (Incomplete)
?Radiation Oncology         [phone] ?________________________________ ? ?Name: Kelsey Duran        MRN: [id number]  ?Date of Service: 09/23/2021 DOB: 12-31-1955 ? ?NL:[id number], Anastasia Pall, MD  Coralie Keens, MD    ? ?REFERRING PHYSICIAN: Coralie Keens, MD ? ? ?DIAGNOSIS: There were no encounter diagnoses. ? ? ?HISTORY OF PRESENT ILLNESS: Kelsey Duran is a 66 y.o. female seen at the request of Dr. Ninfa Linden for a new diagnosis of breast cancer.  She has a strong family history of breast cancer and was having screening MRI due to this history which was performed on 08/20/2021, this revealed a 2.1 cm mass in the upper outer quadrant of the left breast and an anterior to this an indeterminate mass measuring 4 mm.  Simultaneously in the right breast there was a 1 cm enhancing mass in the posterior aspect of the lateral right breast and a second mass measuring 7 mm in the central right breast.  No evidence of axillary adenopathy of either side was identified.  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  Her second left breast biopsy in the upper outer quadrant labeled with a cylinder shaped clip showed fibrocystic change negative for carcinoma.  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 Her cancer on the right was ER/PR positive, HER2 negative with a Ki 67 of 5%. With these findings and her family history she is contemplating her surgical options.  She is leaning towards bilateral mastectomies but considering all of her available intervention.  She met with Dr. Claudia Desanctis last week who offered  her immediate reconstruction with implant if she undergoes mastectomy.  She is seen today to discuss possible treatment with radiotherapy if she had breast conservation. ? ? ? ?PREVIOUS RADIATION THERAPY: {EXAM; YES/NO:19492::"No"} ? ? ?PAST MEDICAL HISTORY:  ?Past Medical History:  ?Diagnosis Date  ? ASTHMA 09/01/2009  ? HYPERTENSION 09/08/2009  ? LUNG NODULE 09/08/2009  ?   ? ? ?PAST SURGICAL HISTORY: ?Past Surgical History:  ?Procedure Laterality Date  ? ABDOMINAL HYSTERECTOMY    ? BREAST BIOPSY Left 09/01/2021  ? BUNIONECTOMY    ? TONSILLECTOMY    ? ? ? ?FAMILY HISTORY:  ?Family History  ?Problem Relation Age of Onset  ? Heart attack Mother   ? Pancreatitis Father   ? Stroke Sister   ? ? ? ?SOCIAL HISTORY:  reports that she quit smoking about 38 years ago. Her smoking use included cigarettes. She has a 8.00 pack-year smoking history. She has never used smokeless tobacco. She reports current alcohol use. She reports current drug use.  The patient is married and lives in [place].  She works for a [employer]. ? ? ?ALLERGIES: Biaxin [clarithromycin], Codeine, Penicillins, and Tetracycline ? ? ?MEDICATIONS:  ?Current Outpatient Medications  ?Medication Sig Dispense Refill  ? albuterol (PROAIR HFA) 108 (90 Base) MCG/ACT inhaler Inhale 2 puffs every 6 hours as needed 18 g 12  ? ALPRAZolam (XANAX) 0.5 MG tablet Take 1 tablet (0.5 mg total) by mouth 2 (two) times daily as needed. 60 tablet 3  ?  azelastine (ASTELIN) 0.1 % nasal spray USE 1 TO 2 SPRAYS IN EACH NOSTRIL ONCE DAILY AT BEDTIME 90 mL 3  ? calcium carbonate (OS-CAL) 600 MG TABS tablet Take 600 mg by mouth 2 (two) times daily with a meal.    ? clobetasol (TEMOVATE) 0.05 % cream Apply topically 2 (two) times daily. 69 g 4  ? Cyanocobalamin (VITAMIN B 12 PO) Take by mouth.    ? fluticasone (FLONASE) 50 MCG/ACT nasal spray 1 puff each nostril twice daiy 16 g 12  ? fluticasone furoate-vilanterol (BREO ELLIPTA) 100-25 MCG/ACT AEPB Inhale  1 puff into the lungs daily. 60 each 0  ? latanoprost (XALATAN) 0.005 % ophthalmic solution     ? magnesium oxide (MAG-OX) 400 MG tablet Take 400 mg by mouth daily.    ? Multiple Vitamin (MULTIVITAMIN) tablet Take 1 tablet by mouth daily.    ? NONFORMULARY OR COMPOUNDED ITEM Allergy Vaccine ?1:10 ?Given at [facility]    ? olmesartan-hydrochlorothiazide (BENICAR HCT) 20-12.5 MG tablet     ? Omega-3 Fatty Acids (FISH OIL) 1000 MG CPDR Take 1 capsule by mouth daily.    ? ?No current facility-administered medications for this visit.  ? ? ? ?REVIEW OF SYSTEMS: On review of systems, the patient reports that she is doing *** ? ?  ? ?PHYSICAL EXAM:  ?Wt Readings from Last 3 Encounters:  ?08/31/21 143 lb (64.9 kg)  ?08/31/20 144 lb (65.3 kg)  ?03/02/20 146 lb 6 oz (66.4 kg)  ? ?Temp Readings from Last 3 Encounters:  ?08/31/21 97.9 ?F (36.6 ?C) (Oral)  ?08/31/20 (!) 97.2 ?F (36.2 ?C) (Temporal)  ?03/02/20 97.7 ?F (36.5 ?C) (Oral)  ? ?BP Readings from Last 3 Encounters:  ?08/31/21 100/68  ?08/31/20 110/62  ?03/02/20 118/72  ? ?Pulse Readings from Last 3 Encounters:  ?08/31/21 71  ?08/31/20 78  ?03/02/20 80  ? ? ?In general this is a well appearing Caucasian female in no acute distress. She's alert and oriented x4 and appropriate throughout the examination. Cardiopulmonary assessment is negative for acute distress and she exhibits normal effort. Bilateral breast exam is deferred. ? ? ? ?ECOG = *** ? ?0 - Asymptomatic (Fully active, able to carry on all predisease activities without restriction) ? ?1 - Symptomatic but completely ambulatory (Restricted in physically strenuous activity but ambulatory and able to carry out work of a light or sedentary nature. For example, light housework, office work) ? ?2 - Symptomatic, <50% in bed during the day (Ambulatory and capable of all self care but unable to carry out any work activities. Up and about more than 50% of waking hours) ? ?3 - Symptomatic, >50% in bed, but not bedbound  (Capable of only limited self-care, confined to bed or chair 50% or more of waking hours) ? ?4 - Bedbound (Completely disabled. Cannot carry on any self-care. Totally confined to bed or chair) ? ?5 - Death ? ? Oken MM, Creech RH, Tormey DC, et al. [phone]). "Toxicity and response criteria of the [facility]". [place] Oncol. 5 (6): 649-55 ? ? ? ?LABORATORY DATA:  ?Lab Results  ?Component Value Date  ? WBC 7.3 08/05/2014  ? HGB 14.1 08/05/2014  ? HCT 40.3 08/05/2014  ? MCV 96.9 08/05/2014  ? PLT 272.0 08/05/2014  ? ?No results found for: NA, K, CL, CO2 ?No results found for: ALT, AST, GGT, ALKPHOS, BILITOT ?  ? ?RADIOGRAPHY: MM CLIP PLACEMENT LEFT ? ?Result Date: 09/01/2021 ?CLINICAL DATA:  Status post MR guided  core biopsy of 2 sites in the LEFT breast. EXAM: 3D DIAGNOSTIC LEFT MAMMOGRAM POST MRI BIOPSY x2 COMPARISON:  Previous exam(s). FINDINGS: 3D Mammographic images were obtained following MRI guided biopsy of enhancing mass in the posterior UPPER OUTER QUADRANT of the LEFT breast and placement of a barbell clip. The biopsy marking clip is along the anterior aspect of the mass in the posterior UPPER OUTER QUADRANT of the LEFT breast. Following biopsy of smaller mass in the anterior UPPER OUTER QUADRANT of the LEFT breast, a cylinder-shaped clip was placed. The clip is 1.2 centimeters MEDIAL to the biopsy site. IMPRESSION: 1. Barbell shaped clip is identified along the anterior aspect of the mass in the UPPER-OUTER QUADRANT of the LEFT breast. 2. Cylinder-shaped clip is 1.2 centimeters MEDIAL to the biopsy site. Final Assessment: Post Procedure Mammograms for Marker Placement Electronically Signed   By: Nolon Nations M.D.   On: 09/01/2021 10:28 ? ?MM CLIP PLACEMENT RIGHT ? ?Result Date: 09/09/2021 ?CLINICAL DATA:  Confirmation of clip placement after MRI guided core needle biopsies of 2 indeterminate RIGHT breast masses. EXAM: 2D and 3D DIAGNOSTIC RIGHT MAMMOGRAM POST MRI BIOPSY  COMPARISON:  Previous exam(s). FINDINGS: 2D and 3D full field CC and mediolateral images were obtained following MRI guided biopsy of 2 indeterminate RIGHT breast masses. The cylinder shaped tissue marking clip

## 2021-09-21 ENCOUNTER — Telehealth: Payer: Self-pay | Admitting: Genetic Counselor

## 2021-09-21 NOTE — Telephone Encounter (Signed)
Pt called in to cancel genetics appt. Called pt, no answer. Left msg for pt to call back to r/s appt.  ?

## 2021-09-22 NOTE — Progress Notes (Incomplete)
[facility] ?CONSULT NOTE ? ?Patient Care Team: ?Glenis Smoker, MD as PCP - General (Family Medicine) ? ?CHIEF COMPLAINTS/PURPOSE OF CONSULTATION:  ?Newly diagnosed breast cancer ? ?HISTORY OF PRESENTING ILLNESS:  ?Kelsey Duran 66 y.o. female is here because of recent diagnosis of {left/right:311354} breast cancer. She presents to the clinic today for a consult. ? ?I reviewed her records extensively and collaborated the history with the patient. ? ?SUMMARY OF ONCOLOGIC HISTORY: ?Oncology History  ? No history exists.  ? ? ? ?MEDICAL HISTORY:  ?Past Medical History:  ?Diagnosis Date  ? ASTHMA 09/01/2009  ? HYPERTENSION 09/08/2009  ? LUNG NODULE 09/08/2009  ? ? ?SURGICAL HISTORY: ?Past Surgical History:  ?Procedure Laterality Date  ? ABDOMINAL HYSTERECTOMY    ? BREAST BIOPSY Left 09/01/2021  ? BUNIONECTOMY    ? TONSILLECTOMY    ? ? ?SOCIAL HISTORY: ?Social History  ? ?Socioeconomic History  ? Marital status: Married  ?  Spouse name: Not on file  ? Number of children: 1  ? Years of education: Not on file  ? Highest education level: Not on file  ?Occupational History  ?  Employer: Civil engineer, contracting  ?Tobacco Use  ? Smoking status: Former  ?  Packs/day: 1.00  ?  Years: 8.00  ?  Pack years: 8.00  ?  Types: Cigarettes  ?  Quit date: 06/21/1983  ?  Years since quitting: 38.2  ? Smokeless tobacco: Never  ?Vaping Use  ? Vaping Use: Never used  ?Substance and Sexual Activity  ? Alcohol use: Yes  ? Drug use: Yes  ? Sexual activity: Not on file  ?Other Topics Concern  ? Not on file  ?Social History Narrative  ? Not on file  ? ?Social Determinants of Health  ? ?Financial Resource Strain: Not on file  ?Food Insecurity: Not on file  ?Transportation Needs: Not on file  ?Physical Activity: Not on file  ?Stress: Not on file  ?Social Connections: Not on file  ?Intimate Partner Violence: Not on file  ? ? ?FAMILY HISTORY: ?Family History  ?Problem Relation Age of Onset  ? Heart attack Mother   ? Pancreatitis Father    ? Stroke Sister   ? ? ?ALLERGIES:  is allergic to biaxin [clarithromycin], codeine, penicillins, and tetracycline. ? ?MEDICATIONS:  ?Current Outpatient Medications  ?Medication Sig Dispense Refill  ? albuterol (PROAIR HFA) 108 (90 Base) MCG/ACT inhaler Inhale 2 puffs every 6 hours as needed 18 g 12  ? ALPRAZolam (XANAX) 0.5 MG tablet Take 1 tablet (0.5 mg total) by mouth 2 (two) times daily as needed. 60 tablet 3  ? azelastine (ASTELIN) 0.1 % nasal spray USE 1 TO 2 SPRAYS IN EACH NOSTRIL ONCE DAILY AT BEDTIME 90 mL 3  ? calcium carbonate (OS-CAL) 600 MG TABS tablet Take 600 mg by mouth 2 (two) times daily with a meal.    ? clobetasol (TEMOVATE) 0.05 % cream Apply topically 2 (two) times daily. 69 g 4  ? Cyanocobalamin (VITAMIN B 12 PO) Take by mouth.    ? fluticasone (FLONASE) 50 MCG/ACT nasal spray 1 puff each nostril twice daiy 16 g 12  ? fluticasone furoate-vilanterol (BREO ELLIPTA) 100-25 MCG/ACT AEPB Inhale 1 puff into the lungs daily. 60 each 0  ? latanoprost (XALATAN) 0.005 % ophthalmic solution     ? magnesium oxide (MAG-OX) 400 MG tablet Take 400 mg by mouth daily.    ? Multiple Vitamin (MULTIVITAMIN) tablet Take 1 tablet by mouth daily.    ?  NONFORMULARY OR COMPOUNDED ITEM Allergy Vaccine ?1:10 ?Given at [facility]    ? olmesartan-hydrochlorothiazide (BENICAR HCT) 20-12.5 MG tablet     ? Omega-3 Fatty Acids (FISH OIL) 1000 MG CPDR Take 1 capsule by mouth daily.    ? ?No current facility-administered medications for this visit.  ? ? ?REVIEW OF SYSTEMS:   ?Constitutional: Denies fevers, chills or abnormal night sweats ?Eyes: Denies blurriness of vision, double vision or watery eyes ?Ears, nose, mouth, throat, and face: Denies mucositis or sore throat ?Respiratory: Denies cough, dyspnea or wheezes ?Cardiovascular: Denies palpitation, chest discomfort or lower extremity swelling ?Gastrointestinal:  Denies nausea, heartburn or change in bowel habits ?Skin: Denies abnormal skin rashes ?Lymphatics: Denies  new lymphadenopathy or easy bruising ?Neurological:Denies numbness, tingling or new weaknesses ?Behavioral/Psych: Mood is stable, no new changes  ?Breast: *** Denies any palpable lumps or discharge ?All other systems were reviewed with the patient and are negative. ? ?PHYSICAL EXAMINATION: ?ECOG PERFORMANCE STATUS: {CHL ONC ECOG GE:[id number]} ? ?There were no vitals filed for this visit. ?There were no vitals filed for this visit. ? ?GENERAL:alert, no distress and comfortable ?SKIN: skin color, texture, turgor are normal, no rashes or significant lesions ?EYES: normal, conjunctiva are pink and non-injected, sclera clear ?OROPHARYNX:no exudate, no erythema and lips, buccal mucosa, and tongue normal  ?NECK: supple, thyroid normal size, non-tender, without nodularity ?LYMPH:  no palpable lymphadenopathy in the cervical, axillary or inguinal ?LUNGS: clear to auscultation and percussion with normal breathing effort ?HEART: regular rate & rhythm and no murmurs and no lower extremity edema ?ABDOMEN:abdomen soft, non-tender and normal bowel sounds ?Musculoskeletal:no cyanosis of digits and no clubbing  ?PSYCH: alert & oriented x 3 with fluent speech ?NEURO: no focal motor/sensory deficits ?BREAST:*** No palpable nodules in breast. No palpable axillary or supraclavicular lymphadenopathy (exam performed in the presence of a chaperone)  ? ?LABORATORY DATA:  ?I have reviewed the data as listed ?Lab Results  ?Component Value Date  ? WBC 7.3 08/05/2014  ? HGB 14.1 08/05/2014  ? HCT 40.3 08/05/2014  ? MCV 96.9 08/05/2014  ? PLT 272.0 08/05/2014  ? ?No results found for: NA, K, CL, CO2 ? ?RADIOGRAPHIC STUDIES: ?I have personally reviewed the radiological reports and agreed with the findings in the report. ? ?ASSESSMENT AND PLAN:  ?No problem-specific Assessment & Plan notes found for this encounter. ? ? ?All questions were answered. The patient knows to call the clinic with any problems, questions or concerns. ?  ? Suzzette Righter, CMA ?09/22/21 ? I Gardiner Coins am scribing for Dr. Lindi Adie ? ?***  ?

## 2021-09-22 NOTE — Progress Notes (Incomplete)
New Breast Cancer Diagnosis: Bilateral Breast ? ?Did patient present with symptoms (if so, please note symptoms) or screening mammography?: Patient reports she was having increasing breast pain in the left breast.  Screening MRI showed 2 masses in the right breast and 2 masses in the left breast.  ? ?Location and Extent of disease :Bilateral breast.  ? 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 ?Receptor Status: ER(positive), PR (positive), Her2-neu (negative), Ki-(5%) ? ? ?Left Breast ?Histology per Pathology Report: grade 2, Invasive Mammary Carcinoma 09/01/2021 ? ?Receptor Status: ER(positive), PR (positive), Her2-neu (negative), Ki-(20%) ? ? ? ?Surgeon and surgical plan, if any: ?Dr. Ninfa Linden 09/10/2021 ?-Although I do not know the pathology on the right breast it appears she has bilateral breast cancer.  ?-We discussed both breast conservation and mastectomies. We discussed the reasons for this of both. We discussed radiation therapy if breast conservation is chosen. Unfortunately, I need to know the final pathology in the right breast before deciding whether or not she is a candidate for lumpectomy on that side.  ?-She will need bilateral sentinel node biopsies as well. We had a long discussion regarding all the surgical options.  ?-Should she choose mastectomy bilaterally, she would like to meet with plastic surgeons preoperatively to discuss whether or not she would have reconstruction at the moment.  ?-I will call her back as it is under the final pathology on the right breast and will continue to determine how to proceed. Referrals will be placed. ? ? ?Plastic Surgical plan, if any: ?Dr. Claudia Desanctis 09/15/2021 ?-Patient is a good candidate for immediate breast reconstruction. ?- Due to her ptosis I do not believe that she is a good candidate for nipple sparing mastectomy.   ?-For the reconstruction we discussed staged reconstruction with placement of a  tissue expander which was subsequently bits would be switched out to a gel implant.  We discussed risks include bleeding, infection, damage to surrounding structures and need for additional procedures. ?-She would still like to think about her exact approach and I believe she will talk to Dr. Ninfa Linden again.  I will be available to help with reconstruction if she ends up wanting to go that direction. ? ? ?Medical oncologist, treatment if any:   ?Dr. Lindi Adie 09/23/2021 ? ? ?Family History of Breast/Ovarian/Prostate Cancer: Paternal aunt. ? ? ?Lymphedema issues, if any:  {:18581} {t:21944}  ? ?Pain issues, if any:  {:18581} {PAIN DESCRIPTION:21022940} ? ?SAFETY ISSUES: ?Prior radiation? {:18581} ?Pacemaker/ICD? {:18581} ?Possible current pregnancy?{:18581} ?Is the patient on methotrexate? {:18581} ? ?Current Complaints / other details:   ?Genetics 09/15/2021 ? ? ?

## 2021-09-23 ENCOUNTER — Ambulatory Visit
Admission: RE | Admit: 2021-09-23 | Discharge: 2021-09-23 | Disposition: A | Discharge status: Home / Self Care | Payer: 59 | Source: Ambulatory Visit | Attending: Radiation Oncology | Admitting: Radiation Oncology

## 2021-09-23 ENCOUNTER — Ambulatory Visit: Admission: RE | Admit: 2021-09-23 | Payer: 59 | Source: Ambulatory Visit

## 2021-09-23 ENCOUNTER — Telehealth: Payer: Self-pay | Admitting: Radiation Oncology

## 2021-09-23 ENCOUNTER — Inpatient Hospital Stay: Payer: 59 | Attending: Hematology and Oncology | Admitting: Hematology and Oncology

## 2021-09-23 ENCOUNTER — Other Ambulatory Visit: Payer: 59

## 2021-09-23 DIAGNOSIS — C50812 Malignant neoplasm of overlapping sites of left female breast: Secondary | ICD-10-CM

## 2021-09-23 DIAGNOSIS — Z170 Estrogen receptor positive status [ER+]: Secondary | ICD-10-CM

## 2021-09-23 NOTE — Telephone Encounter (Signed)
4/6 @ 2:47 pm Left voicemail for patient to call our office to be reschedule for her missed appointment today. ?

## 2021-09-23 NOTE — Assessment & Plan Note (Deleted)
Diffuse left breast pain: Breast MRI 08/23/2021: Left breast highly suspicious mass UOQ 2.1 cm and a smaller mass 4 mm, no abnormal lymph nodes.  Right breast indeterminate masses measuring 7 mm and 10 mm ? ?Right breast biopsy 09/08/2021: Grade 2 IDC with DCIS ER 95%, PR 30%, Ki-67 5%, HER2 0 negative; posterior biopsy: DCIS (T1b N0 stage Ia) ?Left breast biopsy: 09/01/2021: Grade 3 IDC with DCIS ER 100%, PR 95%, Ki-67 20%, HER2 1+ negative, anterior UOQ mass biopsy: Fibrocystic change (T2N0 stage 2A)  ? ?Pathology and radiology counseling:Discussed with the patient, the details of pathology including the type of breast cancer,the clinical staging, the significance of ER, PR and HER-2/neu receptors and the implications for treatment. After reviewing the pathology in detail, we proceeded to discuss the different treatment options between surgery, radiation, chemotherapy, antiestrogen therapies. ? ?Recommendations: ?1.  Bilateral breast conserving surgeries followed by ?2. Oncotype DX testing to determine if chemotherapy would be of any benefit followed by ?3. Adjuvant radiation therapy followed by ?4. Adjuvant antiestrogen therapy ? ?Oncotype counseling: I discussed Oncotype DX test. I explained to the patient that this is a 21 gene panel to evaluate patient tumors DNA to calculate recurrence score. This would help determine whether patient has high risk or low risk breast cancer. She understands that if her tumor was found to be high risk, she would benefit from systemic chemotherapy. If low risk, no need of chemotherapy. ? ?Return to clinic after surgery to discuss final pathology report and then determine if Oncotype DX testing will need to be sent. ? ? ? ? ?

## 2021-09-24 ENCOUNTER — Telehealth: Payer: Self-pay | Admitting: Radiation Oncology

## 2021-09-24 NOTE — Telephone Encounter (Signed)
4/7 @ 3 pm Spoke to patient.  She has confirm that she has decided to have her consult and follow up care closer to home. Her referral has be closed. ?

## 2021-09-28 ENCOUNTER — Telehealth: Payer: Self-pay | Admitting: Internal Medicine

## 2021-09-29 MED ORDER — PREDNISONE 20 MG PO TABS: 20.0000 mg | ORAL_TABLET | Freq: Every day | ORAL | 0 refills | Status: DC

## 2021-09-29 MED ORDER — ALBUTEROL SULFATE HFA 108 (90 BASE) MCG/ACT IN AERS: INHALATION_SPRAY | RESPIRATORY_TRACT | 12 refills | Status: DC

## 2021-09-29 MED ORDER — ALBUTEROL SULFATE HFA 108 (90 BASE) MCG/ACT IN AERS: INHALATION_SPRAY | RESPIRATORY_TRACT | 3 refills | Status: DC

## 2021-09-29 MED ORDER — AZITHROMYCIN 250 MG PO TABS: ORAL_TABLET | ORAL | 0 refills | Status: DC

## 2021-09-29 NOTE — Telephone Encounter (Signed)
Called and spoke with pt who states her asthma began to get worse 2 days ago. States that she has a lot of congestion and is coughing up brown phlegm. States she is also wheezing and has increased SOB due to the coughing. ? ?Pt has had to use her rescue inhaler at least 3-4 times within the past couple days. Pt has not tried any OTC meds but is taking all her prescribed meds as directed. ? ?Pt stated that she is scheduled to have bilateral mastectomy surgery Monday, 4/17 and wants to try to get the cough under control before she has her surgery. ? ?Pt wants any recommendations we may be able to give her to help with her symptoms. ? ?Dr. Annamaria Boots, please advise. ? ? ? ?Allergies  ?Allergen Reactions  ? Biaxin [Clarithromycin]   ?  GI UPSET  ? Codeine Other (See Comments)  ? Penicillins   ? Tetracycline   ? ? ? ?Current Outpatient Medications:  ?  albuterol (PROAIR HFA) 108 (90 Base) MCG/ACT inhaler, Inhale 2 puffs every 6 hours as needed, Disp: 18 g, Rfl: 12 ?  ALPRAZolam (XANAX) 0.5 MG tablet, Take 1 tablet (0.5 mg total) by mouth 2 (two) times daily as needed., Disp: 60 tablet, Rfl: 3 ?  azelastine (ASTELIN) 0.1 % nasal spray, USE 1 TO 2 SPRAYS IN EACH NOSTRIL ONCE DAILY AT BEDTIME, Disp: 90 mL, Rfl: 3 ?  calcium carbonate (OS-CAL) 600 MG TABS tablet, Take 600 mg by mouth 2 (two) times daily with a meal., Disp: , Rfl:  ?  clobetasol (TEMOVATE) 0.05 % cream, Apply topically 2 (two) times daily., Disp: 69 g, Rfl: 4 ?  Cyanocobalamin (VITAMIN B 12 PO), Take by mouth., Disp: , Rfl:  ?  fluticasone (FLONASE) 50 MCG/ACT nasal spray, 1 puff each nostril twice daiy, Disp: 16 g, Rfl: 12 ?  fluticasone furoate-vilanterol (BREO ELLIPTA) 100-25 MCG/ACT AEPB, Inhale 1 puff into the lungs daily., Disp: 60 each, Rfl: 0 ?  latanoprost (XALATAN) 0.005 % ophthalmic solution, , Disp: , Rfl:  ?  magnesium oxide (MAG-OX) 400 MG tablet, Take 400 mg by mouth daily., Disp: , Rfl:  ?  Multiple Vitamin (MULTIVITAMIN) tablet, Take 1 tablet by  mouth daily., Disp: , Rfl:  ?  NONFORMULARY OR COMPOUNDED ITEM, Allergy Vaccine 1:10 Given at [facility], Disp: , Rfl:  ?  olmesartan-hydrochlorothiazide (BENICAR HCT) 20-12.5 MG tablet, , Disp: , Rfl:  ?  Omega-3 Fatty Acids (FISH OIL) 1000 MG CPDR, Take 1 capsule by mouth daily., Disp: , Rfl:  ? ? ? ? ? ?

## 2021-09-29 NOTE — Telephone Encounter (Signed)
Patient called in stating that her asthma has gotten worse. She has surgery on Monday, and is wanting advice on what she should do to clear up the bad cough, mucus, and congestion. Pt also needs refill of albuterol inhaler-hers is expired. Please advise [phone] ?Pharmacy-[facility] ?

## 2021-09-29 NOTE — Telephone Encounter (Signed)
Called and spoke with pt letting her know recs per CY and she verbalized understanding. Also stated to pt that she could try taking OTC cough med to see if that would help get her cough under control and she verbalized understanding. Nothing further needed. ?

## 2021-09-29 NOTE — Telephone Encounter (Signed)
Suggest Zpak 250 mg, # 6, 2 today then one daily ? ?              Prednisone 20 mg, # 2 (two)   1 daily x 2 days  ?

## 2021-10-01 NOTE — Telephone Encounter (Signed)
Spoke with the pt  ?She wanted to confirm that we sent the rx for pred correctly  ?I advised that yes, 20 mg daily x 2 days was what CDY wanted to send  ?Nothing further needed ?

## 2021-11-24 ENCOUNTER — Other Ambulatory Visit: Payer: Self-pay | Admitting: Internal Medicine

## 2021-11-24 NOTE — Telephone Encounter (Signed)
At my last ov in March I had her as being on Breo 200. Can you please refill that and remove the Breo 100.  Thanks

## 2021-11-24 NOTE — Telephone Encounter (Signed)
Dr. Annamaria Boots, please advise on which dose of the Breo pt is supposed to be on as we have Breo 100 on med list and this med refill is for Breo 200.   Allergies  Allergen Reactions   Biaxin [Clarithromycin]     GI UPSET   Codeine Other (See Comments)   Penicillins    Tetracycline     Current Outpatient Medications:    albuterol (PROAIR HFA) 108 (90 Base) MCG/ACT inhaler, Inhale 2 puffs every 6 hours as needed, Disp: 54 g, Rfl: 3   ALPRAZolam (XANAX) 0.5 MG tablet, Take 1 tablet (0.5 mg total) by mouth 2 (two) times daily as needed., Disp: 60 tablet, Rfl: 3   azelastine (ASTELIN) 0.1 % nasal spray, USE 1 TO 2 SPRAYS IN EACH NOSTRIL ONCE DAILY AT BEDTIME, Disp: 90 mL, Rfl: 3   azithromycin (ZITHROMAX) 250 MG tablet, Take two today and then one daily until finished., Disp: 6 tablet, Rfl: 0   calcium carbonate (OS-CAL) 600 MG TABS tablet, Take 600 mg by mouth 2 (two) times daily with a meal., Disp: , Rfl:    clobetasol (TEMOVATE) 0.05 % cream, Apply topically 2 (two) times daily., Disp: 69 g, Rfl: 4   Cyanocobalamin (VITAMIN B 12 PO), Take by mouth., Disp: , Rfl:    fluticasone (FLONASE) 50 MCG/ACT nasal spray, 1 puff each nostril twice daiy, Disp: 16 g, Rfl: 12   fluticasone furoate-vilanterol (BREO ELLIPTA) 100-25 MCG/ACT AEPB, Inhale 1 puff into the lungs daily., Disp: 60 each, Rfl: 0   latanoprost (XALATAN) 0.005 % ophthalmic solution, , Disp: , Rfl:    magnesium oxide (MAG-OX) 400 MG tablet, Take 400 mg by mouth daily., Disp: , Rfl:    Multiple Vitamin (MULTIVITAMIN) tablet, Take 1 tablet by mouth daily., Disp: , Rfl:    NONFORMULARY OR COMPOUNDED ITEM, Allergy Vaccine 1:10 Given at [facility], Disp: , Rfl:    olmesartan-hydrochlorothiazide (BENICAR HCT) 20-12.5 MG tablet, , Disp: , Rfl:    Omega-3 Fatty Acids (FISH OIL) 1000 MG CPDR, Take 1 capsule by mouth daily., Disp: , Rfl:    predniSONE (DELTASONE) 20 MG tablet, Take 1 tablet (20 mg total) by mouth daily with breakfast., Disp: 2  tablet, Rfl: 0

## 2021-12-01 ENCOUNTER — Telehealth: Payer: Self-pay | Admitting: Internal Medicine

## 2021-12-03 ENCOUNTER — Other Ambulatory Visit: Payer: Self-pay | Admitting: Internal Medicine

## 2021-12-03 MED ORDER — FLUTICASONE FUROATE-VILANTEROL 200-25 MCG/ACT IN AEPB: INHALATION_SPRAY | RESPIRATORY_TRACT | 0 refills | Status: DC

## 2021-12-03 MED ORDER — FLUTICASONE FUROATE-VILANTEROL 200-25 MCG/ACT IN AEPB
1.0000 | INHALATION_SPRAY | Freq: Every day | RESPIRATORY_TRACT | 0 refills | Status: DC
Start: 2021-12-03 — End: 2022-01-10

## 2021-12-03 MED ORDER — ARNUITY ELLIPTA 100 MCG/ACT IN AEPB: 1.0000 | INHALATION_SPRAY | Freq: Every day | RESPIRATORY_TRACT | 0 refills | Status: DC

## 2021-12-03 NOTE — Telephone Encounter (Signed)
No- Can't double the Breo 100 and don't need to. [facility] has two drugs- 1)an albuterol type or "LABA" which the active bronchodilator and a stimulant. Doubling this may cause tremor and rapid heart beat.                                   2) an anti-inflammatory steroid or "ICS". This one is passive and works gradually overtime to reduce inflammation. This is the one that is increased in Breo 200.  Using Breo 200 over time may help better control wheeze on a maintenance basis, but won't have any immediate effect. I don't know if we could work with [facility] to see if the appropriate reps could get her some Breo 200 samples. I could even give her a script for a steroid inhaler to use as a supplement while she works her way through her Breo 100. We need to make sure her med lists correctly lists Breo 200.

## 2021-12-03 NOTE — Telephone Encounter (Signed)
Called and spoke with patient. She verbalized understanding. Arnuity has been sent in for her.   Nothing further needed at time of call.

## 2021-12-03 NOTE — Telephone Encounter (Signed)
Called and spoke with patient. She stated that she received her shipment of Breo this past week and just realized it is the wrong strength. She is normally on Breo 200. She received Breo 100. I looked back at her last OV note and CY stated for her to continue on the [place], which at the time was Breo 200. She does not remember discussing lowing the dosage.   She used the Breo 100 last night and could tell a difference.   I advised her that I would call [facility] to see if she could possible exchange the Breo 100 for Breo 200. I looked to see if we had any samples of Breo 200 and we do not. We have Breo 100, Trelegy 100 and Trelegy 200.   Called [facility] to correct the RX. Patient is not able to send back the unused inhalers. In order for her to get the correct RX, we would need to call in a verbal RX.   Verbal order was given to [facility] for the correct RX.   Dr. Annamaria Boots, as she waits on the new RX, will it be ok for her to take 2 puffs of the Breo 100?

## 2021-12-03 NOTE — Telephone Encounter (Signed)
Called and spoke with patient. She verbalized understanding. She stated that she is interested in the steroid inhaler.   She wishes to have this sent to [facility] in [place].   Dr. Annamaria Boots, can you please advise? Thanks!

## 2021-12-03 NOTE — Telephone Encounter (Signed)
Ar nuity 100 inhaler     1 inhalation, then rinse mouth, once daily.  Ref x 5. When the Breo 100 she has now runs out, she will be switching back to [place] 200 and can stop the Arnuity.

## 2021-12-08 ENCOUNTER — Telehealth: Payer: Self-pay | Admitting: Internal Medicine

## 2021-12-08 DIAGNOSIS — J449 Chronic obstructive pulmonary disease, unspecified: Secondary | ICD-10-CM

## 2021-12-08 DIAGNOSIS — J4489 Other specified chronic obstructive pulmonary disease: Secondary | ICD-10-CM

## 2021-12-08 NOTE — Telephone Encounter (Signed)
She needs OV with CXR given length of symptoms and productive cough. Thanks.

## 2021-12-08 NOTE — Telephone Encounter (Signed)
Called and spoke with pt letting her know info per [facility] and she verbalized understanding. Appt scheduled for pt tomorrow 6/22 with [facility] and stated to pt to arrive early for cxr. Nothing further needed.

## 2021-12-08 NOTE — Telephone Encounter (Signed)
Called and spoke with pt who states she has been struggling with her asthma for the past couple of weeks once the smoke in the air first began but states symptoms keep getting worse  Pt said that she has been real SOB to the point that she has not been wanting to eat. States that she is also wheezing and has a lot of tightness in her chest. States she is also coughing a lot getting up a lot of phlegm that is beige in color.  Pt denies any complaints of fever.  Pt states she has had to use her rescue inhaler at least 5 times a day which she does not believe it is working for her. Pt said she has received the Breo 200 which she began taking 4 days ago.  Pt denies being scheduled for an appt.  Pt wants to know what might be able to be recommended to help with her symptoms. [name], please advise.   Allergies  Allergen Reactions   Biaxin [Clarithromycin]     GI UPSET   Codeine Other (See Comments)   Penicillins    Tetracycline      Current Outpatient Medications:    albuterol (PROAIR HFA) 108 (90 Base) MCG/ACT inhaler, Inhale 2 puffs every 6 hours as needed, Disp: 54 g, Rfl: 3   ALPRAZolam (XANAX) 0.5 MG tablet, Take 1 tablet (0.5 mg total) by mouth 2 (two) times daily as needed., Disp: 60 tablet, Rfl: 3   azelastine (ASTELIN) 0.1 % nasal spray, USE 1 TO 2 SPRAYS IN EACH NOSTRIL ONCE DAILY AT BEDTIME, Disp: 90 mL, Rfl: 3   azithromycin (ZITHROMAX) 250 MG tablet, Take two today and then one daily until finished., Disp: 6 tablet, Rfl: 0   calcium carbonate (OS-CAL) 600 MG TABS tablet, Take 600 mg by mouth 2 (two) times daily with a meal., Disp: , Rfl:    clobetasol (TEMOVATE) 0.05 % cream, Apply topically 2 (two) times daily., Disp: 69 g, Rfl: 4   Cyanocobalamin (VITAMIN B 12 PO), Take by mouth., Disp: , Rfl:    fluticasone (FLONASE) 50 MCG/ACT nasal spray, 1 puff each nostril twice daiy, Disp: 16 g, Rfl: 12   Fluticasone Furoate (ARNUITY ELLIPTA) 100 MCG/ACT AEPB, Inhale 1 puff into the lungs  daily., Disp: 30 each, Rfl: 0   fluticasone furoate-vilanterol (BREO ELLIPTA) 200-25 MCG/ACT AEPB, INHALE 1 PUFF INTO THE LUNGS ONCE DAILY, THEN RINSE MOUTH, Disp: 180 each, Rfl: 3   fluticasone furoate-vilanterol (BREO ELLIPTA) 200-25 MCG/ACT AEPB, Inhale 1 puff into the lungs daily., Disp: 60 each, Rfl: 0   latanoprost (XALATAN) 0.005 % ophthalmic solution, , Disp: , Rfl:    magnesium oxide (MAG-OX) 400 MG tablet, Take 400 mg by mouth daily., Disp: , Rfl:    Multiple Vitamin (MULTIVITAMIN) tablet, Take 1 tablet by mouth daily., Disp: , Rfl:    NONFORMULARY OR COMPOUNDED ITEM, Allergy Vaccine 1:10 Given at [facility], Disp: , Rfl:    olmesartan-hydrochlorothiazide (BENICAR HCT) 20-12.5 MG tablet, , Disp: , Rfl:    Omega-3 Fatty Acids (FISH OIL) 1000 MG CPDR, Take 1 capsule by mouth daily., Disp: , Rfl:    predniSONE (DELTASONE) 20 MG tablet, Take 1 tablet (20 mg total) by mouth daily with breakfast., Disp: 2 tablet, Rfl: 0

## 2021-12-09 ENCOUNTER — Encounter: Payer: Self-pay | Admitting: Nurse Practitioner

## 2021-12-09 ENCOUNTER — Ambulatory Visit (INDEPENDENT_AMBULATORY_CARE_PROVIDER_SITE_OTHER): Payer: 59 | Admitting: Nurse Practitioner

## 2021-12-09 ENCOUNTER — Ambulatory Visit (INDEPENDENT_AMBULATORY_CARE_PROVIDER_SITE_OTHER): Payer: 59

## 2021-12-09 VITALS — BP 110/68 | HR 95 | Ht 62.0 in | Wt 139.2 lb

## 2021-12-09 DIAGNOSIS — Z720 Tobacco use: Secondary | ICD-10-CM | POA: Diagnosis not present

## 2021-12-09 DIAGNOSIS — J45901 Unspecified asthma with (acute) exacerbation: Secondary | ICD-10-CM | POA: Diagnosis not present

## 2021-12-09 DIAGNOSIS — J302 Other seasonal allergic rhinitis: Secondary | ICD-10-CM

## 2021-12-09 DIAGNOSIS — J449 Chronic obstructive pulmonary disease, unspecified: Secondary | ICD-10-CM | POA: Diagnosis not present

## 2021-12-09 DIAGNOSIS — J441 Chronic obstructive pulmonary disease with (acute) exacerbation: Secondary | ICD-10-CM

## 2021-12-09 DIAGNOSIS — R918 Other nonspecific abnormal finding of lung field: Secondary | ICD-10-CM

## 2021-12-09 DIAGNOSIS — J3089 Other allergic rhinitis: Secondary | ICD-10-CM | POA: Diagnosis not present

## 2021-12-09 MED ORDER — METHYLPREDNISOLONE ACETATE 80 MG/ML IJ SUSP
80.0000 mg | Freq: Once | INTRAMUSCULAR | Status: AC
  Administered 2021-12-09: 80 mg via INTRAMUSCULAR

## 2021-12-09 MED ORDER — IPRATROPIUM-ALBUTEROL 0.5-2.5 (3) MG/3ML IN SOLN
3.0000 mL | RESPIRATORY_TRACT | Status: AC | PRN
  Administered 2021-12-09: 3 mL via RESPIRATORY_TRACT

## 2021-12-09 MED ORDER — AZELASTINE HCL 0.1 % NA SOLN: NASAL | 3 refills | Status: AC

## 2021-12-09 MED ORDER — BENZONATATE 200 MG PO CAPS: 200.0000 mg | ORAL_CAPSULE | Freq: Three times a day (TID) | ORAL | 1 refills | Status: DC | PRN

## 2021-12-09 MED ORDER — BENZONATATE 200 MG PO CAPS
200.0000 mg | ORAL_CAPSULE | Freq: Three times a day (TID) | ORAL | 1 refills | Status: DC | PRN
Start: 2021-12-09 — End: 2022-04-04

## 2021-12-09 MED ORDER — CEFPODOXIME PROXETIL 200 MG PO TABS: 200.0000 mg | ORAL_TABLET | Freq: Two times a day (BID) | ORAL | 0 refills | Status: AC

## 2021-12-09 MED ORDER — PREDNISONE 10 MG PO TABS: ORAL_TABLET | ORAL | 0 refills | Status: DC

## 2021-12-09 NOTE — Assessment & Plan Note (Signed)
Well-controlled on current regimen. ?

## 2021-12-09 NOTE — Assessment & Plan Note (Signed)
AECOPD/asthma with acute bronchitis. CXR with prominence of interstitial markings, concerning for bronchitis vs pna. Bronchospasm on exam. Depo 80 mg inj x 1 and duoneb in office. Prednisone taper and cefpodoxime course. Continue ICS/LABA and prn albuterol. Cough control regimen advised.   Patient Instructions  Continue Breo 1 puff daily. Brush tongue and rinse mouth afterwards. Continue Albuterol inhaler 2 puffs every 6 hours as needed for shortness of breath or wheezing. Notify if symptoms persist despite rescue inhaler/neb use. Continue flonase nasal spray 1 spray each nostril twice daily  Continue astelin nasal spray 1-2 sprays each nostril daily   Prednisone taper. 4 tabs for 2 days, then 3 tabs for 2 days, 2 tabs for 2 days, then 1 tab for 2 days, then stop. Take in AM with food. Start tomorrow  Cefpodoxime 1 tab Twice daily for 7 days. Take with food Delsym 2 tsp Twice daily for cough Tessalon perles (benzonatate) 1 capsule Three times a day for cough  CT chest wo contrast for nodule follow up/lung cancer screen - you do not qualify for the lung cancer screening program as you quit >15 years ago.  Follow up in two weeks with Dr. Annamaria Boots or Alanson Aly. Ok to do virtual visit if improved with K Yanel Dombrosky,NP. If symptoms do not improve or worsen, please contact office for sooner follow up or seek emergency care.

## 2021-12-09 NOTE — Assessment & Plan Note (Signed)
Considered benign due to stability. She does have a significant smoking history but quit >15 years ago so does not qualify for lung cancer screening program. Recommended with continue to monitor with annual screenings - CT chest ordered today.

## 2021-12-09 NOTE — Progress Notes (Signed)
$'@Patient'd$  ID: Kelsey Duran, female    DOB: [birth year], 66 y.o.   MRN: [id number]  Chief Complaint  Patient presents with   Follow-up    Referring provider: Glenis Smoker, *  HPI: 66 year old female, former smoker (30 pack years) followed for asthma with COPD and lung nodules. She is a patient of Dr. Janee Morn and last seen in office on 08/31/2021. She was recently diagnosed with Stage IIA breast cancer and underwent bilateral mastectomies in April; currently on letrozole. Past medical history significant for HTN, GERD, seasonal allergic rhinitis.  TEST/EVENTS:  11/03/2014 PFTs: FVC 75, FEV1 56, ratio 59, TLC 118%, DLCOunc 93%. Moderate obstructive airway disease with reversibility  09/15/2020 CT chest wo con: no LAD. Moderate hiatal hernia. There is lingular nodularity with small nodules throughout the lingula similar to the prior study. Airways are patent. There are multiple nodules, largest 8x6 mm; all of which are stable over 1 year interval.   08/31/2021: OV with Dr. Annamaria Boots. Rarely requires albuterol. Doing well on Breo. CT scan of the chest with nodules showing benign behavior; referred to lung cancer screening program.   12/09/2021: Today - acute Patient presents today after calling into the office yesterday with new cough and increased SOB that started two weeks ago. Today, she reports that she began having problems when we had poor air quality. She noticed her chest was tighter and she was wheezing more. She then began having increased shortness of breath, which has made her not want to eat much. She continues to have a persistent, productive cough with beige phlegm.  She denies hemoptysis, weight loss, fevers, night sweats, URI symptoms, calf pain or leg swelling. She continues on Breo 200 daily and has had to use her rescue inhaler frequently. She continues on flonase and astelin with good control of her postnasal drainage.  Allergies  Allergen Reactions   Biaxin [Clarithromycin]      GI UPSET   Codeine Other (See Comments)   Penicillins    Tetracycline     Immunization History  Administered Date(s) Administered   Influenza Split 03/21/2011, 03/27/2012, 04/11/2012, 05/01/2013, 03/28/2014, 03/30/2015, 03/20/2017, 03/28/2018   Influenza,inj,Quad PF,6+ Mos 04/11/2013, 03/21/2015, 03/22/2016, 03/01/2019, 03/02/2020, 02/17/2021   PFIZER(Purple Top)SARS-COV-2 Vaccination 09/11/2019, 10/04/2019, 06/15/2020   Pneumococcal Polysaccharide-23 03/01/2019   Tdap 03/21/2012, 07/16/2013, 04/08/2017, 11/25/2020    Past Medical History:  Diagnosis Date   ASTHMA 09/01/2009   HYPERTENSION 09/08/2009   LUNG NODULE 09/08/2009    Tobacco History: Social History   Tobacco Use  Smoking Status Former   Packs/day: 2.00   Years: 15.00   Total pack years: 30.00   Types: Cigarettes   Quit date: 06/21/1983   Years since quitting: 38.4  Smokeless Tobacco Never   Counseling given: Not Answered   Outpatient Medications Prior to Visit  Medication Sig Dispense Refill   albuterol (PROAIR HFA) 108 (90 Base) MCG/ACT inhaler Inhale 2 puffs every 6 hours as needed 54 g 3   azelastine (ASTELIN) 0.1 % nasal spray USE 1 TO 2 SPRAYS IN EACH NOSTRIL ONCE DAILY AT BEDTIME 90 mL 3   fluticasone (FLONASE) 50 MCG/ACT nasal spray 1 puff each nostril twice daiy 16 g 12   fluticasone furoate-vilanterol (BREO ELLIPTA) 200-25 MCG/ACT AEPB INHALE 1 PUFF INTO THE LUNGS ONCE DAILY, THEN RINSE MOUTH 180 each 3   fluticasone furoate-vilanterol (BREO ELLIPTA) 200-25 MCG/ACT AEPB Inhale 1 puff into the lungs daily. 60 each 0   letrozole (FEMARA) 2.5 MG tablet Take 1 tablet  by mouth daily.     magnesium oxide (MAG-OX) 400 MG tablet Take 400 mg by mouth daily.     Multiple Vitamin (MULTIVITAMIN) tablet Take 1 tablet by mouth daily.     olmesartan-hydrochlorothiazide (BENICAR HCT) 20-12.5 MG tablet      Omega-3 Fatty Acids (FISH OIL) 1000 MG CPDR Take 1 capsule by mouth daily.     ALPRAZolam (XANAX) 0.5 MG  tablet Take 1 tablet (0.5 mg total) by mouth 2 (two) times daily as needed. (Patient not taking: Reported on 12/09/2021) 60 tablet 3   azithromycin (ZITHROMAX) 250 MG tablet Take two today and then one daily until finished. (Patient not taking: Reported on 12/09/2021) 6 tablet 0   calcium carbonate (OS-CAL) 600 MG TABS tablet Take 600 mg by mouth 2 (two) times daily with a meal. (Patient not taking: Reported on 12/09/2021)     clobetasol (TEMOVATE) 0.05 % cream Apply topically 2 (two) times daily. (Patient not taking: Reported on 12/09/2021) 69 g 4   Cyanocobalamin (VITAMIN B 12 PO) Take by mouth. (Patient not taking: Reported on 12/09/2021)     Fluticasone Furoate (ARNUITY ELLIPTA) 100 MCG/ACT AEPB Inhale 1 puff into the lungs daily. (Patient not taking: Reported on 12/09/2021) 30 each 0   latanoprost (XALATAN) 0.005 % ophthalmic solution      NONFORMULARY OR COMPOUNDED ITEM Allergy Vaccine 1:10 Given at [facility]     predniSONE (DELTASONE) 20 MG tablet Take 1 tablet (20 mg total) by mouth daily with breakfast. (Patient not taking: Reported on 12/09/2021) 2 tablet 0   No facility-administered medications prior to visit.     Review of Systems:   Constitutional: No weight loss or gain, night sweats, fevers, chills, fatigue, or lassitude. HEENT: No headaches, difficulty swallowing, tooth/dental problems, or sore throat. No sneezing, itching, ear ache, nasal congestion, or post nasal drip CV:  No chest pain, orthopnea, PND, swelling in lower extremities, anasarca, dizziness, palpitations, syncope Resp: +shortness of breath with exertion; productive cough; wheezing.  No hemoptysis.  No chest wall deformity GI:  No heartburn, indigestion, abdominal pain, nausea, vomiting, diarrhea, change in bowel habits, loss of appetite, bloody stools.  Skin: No rash, lesions, ulcerations MSK:  No joint pain or swelling.  No decreased range of motion.  No back pain. Neuro: No dizziness or lightheadedness.   Psych: No depression or anxiety. Mood stable.     Physical Exam:  BP 110/68 (BP Location: Right Arm, Cuff Size: Normal)   Pulse 95   Ht '5\' 2"'$  (1.575 m)   Wt 139 lb 3.2 oz (63.1 kg)   SpO2 95%   BMI 25.46 kg/m   GEN: Pleasant, interactive, well-appearing; in no acute distress. HEENT:  Normocephalic and atraumatic. EACs patent bilaterally. TM pearly gray with present light reflex bilaterally. PERRLA. Sclera white. Nasal turbinates pink, moist and patent bilaterally. No rhinorrhea present. Oropharynx pink and moist, without exudate or edema. No lesions, ulcerations, or postnasal drip.  NECK:  Supple w/ fair ROM. No JVD present. Normal carotid impulses w/o bruits. Thyroid symmetrical with no goiter or nodules palpated. No lymphadenopathy.   CV: RRR, no m/r/g, no peripheral edema. Pulses intact, +2 bilaterally. No cyanosis, pallor or clubbing. PULMONARY:  Unlabored, regular breathing. Scattered wheezes bilaterally A&P. Congested, bronchitic cough. No accessory muscle use. No dullness to percussion. GI: BS present and normoactive. Soft, non-tender to palpation. No organomegaly or masses detected. No CVA tenderness. MSK: No erythema, warmth or tenderness. Cap refil <2 sec all extrem. No deformities or joint  swelling noted.  Neuro: A/Ox3. No focal deficits noted.   Skin: Warm, no lesions or rashe Psych: Normal affect and behavior. Judgement and thought content appropriate.     Lab Results:  CBC    Component Value Date/Time   WBC 7.3 08/05/2014 1006   RBC 4.16 08/05/2014 1006   HGB 14.1 08/05/2014 1006   HCT 40.3 08/05/2014 1006   PLT 272.0 08/05/2014 1006   MCV 96.9 08/05/2014 1006   MCHC 34.9 08/05/2014 1006   RDW 12.2 08/05/2014 1006   LYMPHSABS 2.0 08/05/2014 1006   MONOABS 0.6 08/05/2014 1006   EOSABS 0.5 08/05/2014 1006   BASOSABS 0.1 08/05/2014 1006    BMET No results found for: "NA", "K", "CL", "CO2", "GLUCOSE", "BUN", "CREATININE", "CALCIUM", "GFRNONAA",  "GFRAA"  BNP No results found for: "BNP"   Imaging:  DG Chest 2 View  Result Date: 12/09/2021 CLINICAL DATA:  Cough, wheezing EXAM: CHEST - 2 VIEW COMPARISON:  Previous studies including chest radiographs done on 03/01/2019 and CT chest done on 09/15/2020 FINDINGS: Cardiac size is within normal limits. Increase in AP diameter of chest suggests COPD. There is slight prominence of interstitial markings in both lungs. There is no focal consolidation. There is no pleural effusion or pneumothorax. S shaped scoliosis is noted in the thoracic and lumbar spine. IMPRESSION: COPD. Prominence of interstitial markings in both lungs may suggest bronchitis or interstitial pneumonia. There is no focal pulmonary consolidation. There is no pleural effusion. Electronically Signed   By: Elmer Picker M.D.   On: 12/09/2021 11:04    ipratropium-albuterol (DUONEB) 0.5-2.5 (3) MG/3ML nebulizer solution 3 mL     Date Action Dose Route User   12/09/2021 1139 Given 3 mL Nebulization Reyes Renderos, Sherrill L, LPN      methylPREDNISolone acetate (DEPO-MEDROL) injection 80 mg     Date Action Dose Route User   12/09/2021 1137 Given 80 mg Intramuscular (Right Ventrogluteal) Ronney Lion, Lorenso Courier, LPN          Latest Ref Rng & Units 11/03/2014    9:07 AM  PFT Results  FVC-Pre L 2.37   FVC-Predicted Pre % 75   FVC-Post L 2.65   FVC-Predicted Post % 84   Pre FEV1/FVC % % 58   Post FEV1/FCV % % 59   FEV1-Pre L 1.38   FEV1-Predicted Pre % 56   FEV1-Post L 1.58   DLCO uncorrected ml/min/mmHg 20.82   DLCO UNC% % 93   DLVA Predicted % 105   TLC L 5.74   TLC % Predicted % 118   RV % Predicted % 169     No results found for: "NITRICOXIDE"      Assessment & Plan:   Asthma with COPD with exacerbation (HCC) AECOPD/asthma with acute bronchitis. CXR with prominence of interstitial markings, concerning for bronchitis vs pna. Bronchospasm on exam. Depo 80 mg inj x 1 and duoneb in office. Prednisone  taper and cefpodoxime course. Continue ICS/LABA and prn albuterol. Cough control regimen advised.   Patient Instructions  Continue Breo 1 puff daily. Brush tongue and rinse mouth afterwards. Continue Albuterol inhaler 2 puffs every 6 hours as needed for shortness of breath or wheezing. Notify if symptoms persist despite rescue inhaler/neb use. Continue flonase nasal spray 1 spray each nostril twice daily  Continue astelin nasal spray 1-2 sprays each nostril daily   Prednisone taper. 4 tabs for 2 days, then 3 tabs for 2 days, 2 tabs for 2 days, then 1 tab for 2 days, then  stop. Take in AM with food. Start tomorrow  Cefpodoxime 1 tab Twice daily for 7 days. Take with food Delsym 2 tsp Twice daily for cough Tessalon perles (benzonatate) 1 capsule Three times a day for cough  CT chest wo contrast for nodule follow up/lung cancer screen - you do not qualify for the lung cancer screening program as you quit >15 years ago.  Follow up in two weeks with Dr. Annamaria Boots or Alanson Aly. Ok to do virtual visit if improved with K Azarie Coriz,NP. If symptoms do not improve or worsen, please contact office for sooner follow up or seek emergency care.    Lung nodules Considered benign due to stability. She does have a significant smoking history but quit >15 years ago so does not qualify for lung cancer screening program. Recommended with continue to monitor with annual screenings - CT chest ordered today.  Seasonal and perennial allergic rhinitis Well-controlled on current regimen.   I spent 35 minutes of dedicated to the care of this patient on the date of this encounter to include pre-visit review of records, face-to-face time with the patient discussing conditions above, post visit ordering of testing, clinical documentation with the electronic health record, making appropriate referrals as documented, and communicating necessary findings to members of the patients care team.  Clayton Bibles,  NP 12/09/2021  Pt aware and understands NP's role.

## 2021-12-09 NOTE — Patient Instructions (Addendum)
Continue Breo 1 puff daily. Brush tongue and rinse mouth afterwards. Continue Albuterol inhaler 2 puffs every 6 hours as needed for shortness of breath or wheezing. Notify if symptoms persist despite rescue inhaler/neb use. Continue flonase nasal spray 1 spray each nostril twice daily  Continue astelin nasal spray 1-2 sprays each nostril daily   Prednisone taper. 4 tabs for 2 days, then 3 tabs for 2 days, 2 tabs for 2 days, then 1 tab for 2 days, then stop. Take in AM with food. Start tomorrow  Cefpodoxime 1 tab Twice daily for 7 days. Take with food Delsym 2 tsp Twice daily for cough Tessalon perles (benzonatate) 1 capsule Three times a day for cough  CT chest wo contrast for nodule follow up/lung cancer screen - you do not qualify for the lung cancer screening program as you quit >15 years ago.  Follow up in two weeks with Dr. Annamaria Boots or Alanson Aly. Ok to do virtual visit if improved with K April Carlyon,NP. If symptoms do not improve or worsen, please contact office for sooner follow up or seek emergency care.

## 2021-12-28 ENCOUNTER — Ambulatory Visit (HOSPITAL_COMMUNITY)
Admission: RE | Admit: 2021-12-28 | Discharge: 2021-12-28 | Disposition: A | Discharge status: Home / Self Care | Payer: 59 | Source: Ambulatory Visit | Attending: Nurse Practitioner | Admitting: Nurse Practitioner

## 2021-12-28 DIAGNOSIS — Z720 Tobacco use: Secondary | ICD-10-CM | POA: Diagnosis present

## 2021-12-28 DIAGNOSIS — R918 Other nonspecific abnormal finding of lung field: Secondary | ICD-10-CM | POA: Insufficient documentation

## 2021-12-30 NOTE — Progress Notes (Signed)
Please notify patient that her CT showed that all of her lung nodules are stable and have not changed since 2012, which is great news. No evidence of new nodules. There are some areas of scarring in her lungs but otherwise, no new findings. Thanks!

## 2022-01-09 ENCOUNTER — Other Ambulatory Visit: Payer: Self-pay | Admitting: Internal Medicine

## 2022-01-14 ENCOUNTER — Other Ambulatory Visit: Payer: Self-pay | Admitting: Internal Medicine

## 2022-01-14 NOTE — Telephone Encounter (Signed)
Arnuity refilled

## 2022-02-06 ENCOUNTER — Other Ambulatory Visit: Payer: Self-pay | Admitting: Internal Medicine

## 2022-04-02 NOTE — Progress Notes (Unsigned)
Cardiology Office Note:    Date:  04/04/2022   ID:  ADALAI PERL, DOB October 12, 1955, MRN [id number]  PCP:  Glenis Smoker, MD   [place] [facility] Cardiologist:  Werner Lean, MD     Referring MD: Glenis Smoker, *   CC: palpitations Consulted for the evaluation of chest pain at the behest of Dr. Lindell Noe  History of Present Illness:    Kelsey Duran is a 66 y.o. female with a hx of HTN, Prior breast cancer, former smoker with lung nodules. With no vascular calcium who presents with chest pain.  Patient notes that she is feeling some heart pounding for no reason.   Has sudden onset tachycardia laying in bed. Heart disease is on her mother's side of family: Stroke MI (X2).  No arrhythmias.  Has some chest tightness.  Varies when it occurs. Notes that she has DOE with minimal activity. Doesn't have the energy she used to. Feels the palpitations sever times a week.  No syncope, near syncope only with quick standing.  Patient reports prior cardiac testing including negative stress test in 2013. Normal EF 2013.   Past Medical History:  Diagnosis Date   ASTHMA 09/01/2009   HYPERTENSION 09/08/2009   LUNG NODULE 09/08/2009    Past Surgical History:  Procedure Laterality Date   ABDOMINAL HYSTERECTOMY     BREAST BIOPSY Left 09/01/2021   BUNIONECTOMY     TONSILLECTOMY      Current Medications: Current Meds  Medication Sig   acetaminophen (TYLENOL) 500 MG tablet Take by mouth as needed.   albuterol (PROAIR HFA) 108 (90 Base) MCG/ACT inhaler Inhale 2 puffs every 6 hours as needed   ALPRAZolam (XANAX) 0.5 MG tablet Take 1 tablet (0.5 mg total) by mouth 2 (two) times daily as needed.   anastrozole (ARIMIDEX) 1 MG tablet Take 1 mg by mouth daily.   azelastine (ASTELIN) 0.1 % nasal spray USE 1 TO 2 SPRAYS IN EACH NOSTRIL ONCE DAILY AT BEDTIME   buPROPion (WELLBUTRIN XL) 150 MG 24 hr tablet Take 75 mg by mouth every morning. 1/2 tab   calcium  carbonate (OS-CAL) 600 MG TABS tablet Take 600 mg by mouth 2 (two) times daily with a meal.   clobetasol (TEMOVATE) 0.05 % cream Apply topically 2 (two) times daily. (Patient taking differently: Apply topically as needed (rash).)   famotidine (PEPCID) 20 MG tablet Take by mouth as needed for heartburn.   fluticasone (FLONASE) 50 MCG/ACT nasal spray 1 puff each nostril twice daiy (Patient taking differently: as needed for allergies. 1 puff each nostril twice daiy)   fluticasone furoate-vilanterol (BREO ELLIPTA) 200-25 MCG/ACT AEPB INHALE 1 PUFF INTO THE LUNGS DAILY   ivabradine (CORLANOR) 7.5 MG TABS tablet Take 2 hours prior to Cardiac CT   latanoprost (XALATAN) 0.005 % ophthalmic solution    magnesium oxide (MAG-OX) 400 MG tablet Take 400 mg by mouth daily.   Multiple Vitamin (MULTIVITAMIN) tablet Take 1 tablet by mouth daily.   NONFORMULARY OR COMPOUNDED ITEM Allergy Vaccine 1:10 Given at [facility]   olmesartan-hydrochlorothiazide (BENICAR HCT) 20-12.5 MG tablet    Omega-3 Fatty Acids (FISH OIL) 1000 MG CPDR Take 1 capsule by mouth daily.   traZODone (DESYREL) 50 MG tablet Take 100 mg by mouth at bedtime as needed.   triamcinolone cream (KENALOG) 0.1 % Apply 1 Application topically as needed for rash.   [DISCONTINUED] Fluticasone Furoate (ARNUITY ELLIPTA) 100 MCG/ACT AEPB INHALE 1 PUFF INTO THE LUNGS DAILY   [  DISCONTINUED] letrozole (FEMARA) 2.5 MG tablet Take 1 tablet by mouth daily.   [DISCONTINUED] predniSONE (DELTASONE) 10 MG tablet 4 tabs for 2 days, then 3 tabs for 2 days, 2 tabs for 2 days, then 1 tab for 2 days, then stop   Current Facility-Administered Medications for the 04/04/22 encounter (Office Visit) with Werner Lean, MD  Medication   ipratropium-albuterol (DUONEB) 0.5-2.5 (3) MG/3ML nebulizer solution 3 mL     Allergies:   Pistachio nut extract skin test, Biaxin [clarithromycin], Codeine, Penicillins, Tetracycline, and Valsartan   Social History    Socioeconomic History   Marital status: Married    Spouse name: Not on file   Number of children: 1   Years of education: Not on file   Highest education level: Not on file  Occupational History    Employer: [employer]  Tobacco Use   Smoking status: Former    Packs/day: 2.00    Years: 15.00    Total pack years: 30.00    Types: Cigarettes    Quit date: 06/21/1983    Years since quitting: 38.8   Smokeless tobacco: Never  Vaping Use   Vaping Use: Never used  Substance and Sexual Activity   Alcohol use: Yes   Drug use: Yes   Sexual activity: Not on file  Other Topics Concern   Not on file  Social History Narrative   Not on file   Social Determinants of Health   Financial Resource Strain: Not on file  Food Insecurity: Not on file  Transportation Needs: Not on file  Physical Activity: Not on file  Stress: Not on file  Social Connections: Not on file     Family History: The patient's family history includes Heart attack in her mother; Pancreatitis in her father; Stroke in her sister.  ROS:   Please see the history of present illness.     All other systems reviewed and are negative.  EKGs/Labs/Other Studies Reviewed:    The following studies were reviewed today:  EKG:  EKG is  ordered today.  The ekg ordered today demonstrates  04/04/22: SR rate 83  Recent Labs: No results found for requested labs within last 365 days.  Recent Lipid Panel No results found for: "CHOL", "TRIG", "HDL", "CHOLHDL", "VLDL", "LDLCALC", "LDLDIRECT"    Physical Exam:    VS:  BP 100/68   Pulse 83   Ht '5\' 3"'$  (1.6 m)   Wt 146 lb 6.4 oz (66.4 kg)   SpO2 96%   BMI 25.93 kg/m     Wt Readings from Last 3 Encounters:  04/04/22 146 lb 6.4 oz (66.4 kg)  12/09/21 139 lb 3.2 oz (63.1 kg)  08/31/21 143 lb (64.9 kg)    GEN:  Well nourished, well developed in no acute distress HEENT: Normal NECK: No JVD; No carotid bruits LYMPHATICS: No lymphadenopathy CARDIAC: RRR, no  murmurs, rubs, gallops RESPIRATORY:  Bilateral inspirator wheezes normal rate and effort  ABDOMEN: Soft, non-tender, non-distended MUSCULOSKELETAL:  No edema; No deformity  SKIN: Warm and dry NEUROLOGIC:  Alert and oriented x 3 PSYCHIATRIC:  Normal affect   ASSESSMENT:    1. Precordial pain   2. Palpitations   3. Precordial chest pain   4. DOE (dyspnea on exertion)   5. Asthma with COPD with exacerbation ([place])    PLAN:    Palpitations Chest pain syndrome COPD DOE - I am most suspicious of an arrhythima: 7 day non live ziopatch - if we find arrhythmia will need  to stop benicar for AV nodal therapy (diltiazem) - given BP may need 1c agent, and given the sx about needs ischemic and structural testing - she cannot tolerate treadmill; she cannot get lexiscan due to active wheeze - will get CCTA with ivabradine; will get echo  3-4 months me or APP        Medication Adjustments/Labs and Tests Ordered: Current medicines are reviewed at length with the patient today.  Concerns regarding medicines are outlined above.  Orders Placed This Encounter  Procedures   CT CORONARY MORPH W/CTA COR W/SCORE W/CA W/CM &/OR WO/CM   Basic metabolic panel   LONG TERM MONITOR (3-14 DAYS)   EKG 12-Lead   ECHOCARDIOGRAM COMPLETE   Meds ordered this encounter  Medications   ivabradine (CORLANOR) 7.5 MG TABS tablet    Sig: Take 2 hours prior to Cardiac CT    Dispense:  2 tablet    Refill:  0    Patient Instructions  Medication Instructions:  Your physician recommends that you continue on your current medications as directed. Please refer to the Current Medication list given to you today.  *If you need a refill on your cardiac medications before your next appointment, please call your pharmacy*   Lab Work: TODAY: BMP If you have labs (blood work) drawn today and your tests are completely normal, you will receive your results only by: [place] (if you have MyChart) OR A paper  copy in the mail If you have any lab test that is abnormal or we need to change your treatment, we will call you to review the results.   Testing/Procedures: Your physician has requested that you have an echocardiogram. Echocardiography is a painless test that uses sound waves to create images of your heart. It provides your doctor with information about the size and shape of your heart and how well your heart's chambers and valves are working. This procedure takes approximately one hour. There are no restrictions for this procedure. Please do NOT wear cologne, perfume, aftershave, or lotions (deodorant is allowed). Please arrive 15 minutes prior to your appointment time.  Your physician has requested that you have a Cardiac CT.   Your physician has requested that you wear a 7 day heart monitor.    Follow-Up: At [facility], you and your health needs are our priority.  As part of our continuing mission to provide you with exceptional heart care, we have created designated Provider Care Teams.  These Care Teams include your primary Cardiologist (physician) and Advanced Practice Providers (APPs -  Physician Assistants and Nurse Practitioners) who all work together to provide you with the care you need, when you need it.  We recommend signing up for the patient portal called "MyChart".  Sign up information is provided on this After Visit Summary.  MyChart is used to connect with patients for Virtual Visits (Telemedicine).  Patients are able to view lab/test results, encounter notes, upcoming appointments, etc.  Non-urgent messages can be sent to your provider as well.   To learn more about what you can do with MyChart, go to [email or web address].    Your next appointment:   3-4 month(s)  The format for your next appointment:   In Person  Provider:   Werner Lean, MD  or Melina Copa, PA-C, Ambrose Pancoast, NP, or Ermalinda Barrios, PA-C      Other Instructions  Your cardiac  CT will be scheduled at one of the below locations:   [facility]  [place], [place] [address] [phone]  [place] [address] [place], [place] [address] [phone]  [facility] [place], [place] [address] [phone]  If scheduled at [facility], please arrive at the [facility] and Children's Entrance (Entrance C2) of [facility] 30 minutes prior to test start time. You can use the FREE valet parking offered at entrance C (encouraged to control the heart rate for the test)  Proceed to the [facility] Radiology Department (first floor) to check-in and test prep.  All radiology patients and guests should use entrance C2 at [facility], accessed from [facility], even though the hospital's physical address listed is [address].    If scheduled at [facility] or [facility], please arrive 15 mins early for check-in and test prep.   Please follow these instructions carefully (unless otherwise directed):   On the Night Before the Test: Be sure to Drink plenty of water. Do not consume any caffeinated/decaffeinated beverages or chocolate 12 hours prior to your test. Do not take any antihistamines 12 hours prior to your test. - - Flonase, Astelin, Breo   On the Day of the Test: Drink plenty of water until 1 hour prior to the test. Do not eat any food 1 hour prior to test. You may take your regular medications prior to the test.  Take ivabradine (Corlanor) 15 mg by mouth two hours prior to test. HOLD Furosemide/Hydrochlorothiazide morning of the test. FEMALES- please wear underwire-free bra if available, avoid dresses & tight clothing       After the Test: Drink plenty of water. After receiving IV contrast, you may experience a mild flushed feeling. This  is normal. On occasion, you may experience a mild rash up to 24 hours after the test. This is not dangerous. If this occurs, you can take Benadryl 25 mg and increase your fluid intake. If you experience trouble breathing, this can be serious. If it is severe call 911 IMMEDIATELY. If it is mild, please call our office. If you take any of these medications: Glipizide/Metformin, Avandament, Glucavance, please do not take 48 hours after completing test unless otherwise instructed.  We will call to schedule your test 2-4 weeks out understanding that some insurance companies will need an authorization prior to the service being performed.   For non-scheduling related questions, please contact the cardiac imaging nurse navigator should you have any questions/concerns: Marchia Bond, Cardiac Imaging Nurse Navigator Gordy Clement, Cardiac Imaging Nurse Navigator [facility] Direct Office Dial: [phone]   For scheduling needs, including cancellations and rescheduling, please call [place], [phone].      ZIO XT- Long Term Monitor Instructions  Your physician has requested you wear a ZIO patch monitor for 7 days.  This is a single patch monitor. [employer] supplies one patch monitor per enrollment. Additional stickers are not available. Please do not apply patch if you will be having a Nuclear Stress Test,  Echocardiogram, Cardiac CT, MRI, or Chest Xray during the period you would be wearing the  monitor. The patch cannot be worn during these tests. You cannot remove and re-apply the  ZIO XT patch monitor.  Your ZIO patch monitor will be mailed 3 day USPS to your address on file. It may take 3-5 days  to receive your monitor after you have been enrolled.  Once you have received your monitor, please review the enclosed instructions. Your monitor  has already been registered assigning a specific monitor serial # to you.  Billing and Patient Assistance Program  Information  We have supplied [employer] with any of your insurance information on file for billing purposes. [employer] offers a sliding scale Patient Assistance Program for patients that do not have  insurance, or whose insurance does not completely cover the cost of the ZIO monitor.  You must apply for the Patient Assistance Program to qualify for this discounted rate.  To apply, please call [employer] at [phone], select option 4, select option 2, ask to apply for  Patient Assistance Program. Theodore Demark will ask your household income, and how many people  are in your household. They will quote your out-of-pocket cost based on that information.  [employer] will also be able to set up a 60-month interest-free payment plan if needed.  Applying the monitor   Shave hair from upper left chest.  Hold abrader disc by orange tab. Rub abrader in 40 strokes over the upper left chest as  indicated in your monitor instructions.  Clean area with 4 enclosed alcohol pads. Let dry.  Apply patch as indicated in monitor instructions. Patch will be placed under collarbone on left  side of chest with arrow pointing upward.  Rub patch adhesive wings for 2 minutes. Remove white label marked "1". Remove the white  label marked "2". Rub patch adhesive wings for 2 additional minutes.  While looking in a mirror, press and release button in center of patch. A small green light will  flash 3-4 times. This will be your only indicator that the monitor has been turned on.  Do not shower for the first 24 hours. You may shower after the first 24 hours.  Press the button if you feel a symptom. You will hear a small click. Record Date, Time and  Symptom in the Patient Logbook.  When you are ready to remove the patch, follow instructions on the last 2 pages of Patient  Logbook. Stick patch monitor onto the last page of Patient Logbook.  Place Patient Logbook in the blue and white box. Use locking tab on box and tape box closed   securely. The blue and white box has prepaid postage on it. Please place it in the mailbox as  soon as possible. Your physician should have your test results approximately 7 days after the  [facility]  Call ITaltyat [phone] you have questions regarding  your ZIO XT patch monitor. Call them immediately if you see an orange light blinking on your  monitor.  If your monitor falls off in less than 4 days, contact our Monitor department at [phone]  If your monitor becomes loose or falls off after 4 days call [employer] at [phone]  suggestions on securing your monitor   Important Information About Sugar         Signed, MWerner Lean MD  04/04/2022 5:17 PM    CUllin

## 2022-04-04 ENCOUNTER — Ambulatory Visit: Payer: 59 | Attending: Internal Medicine | Admitting: Internal Medicine

## 2022-04-04 ENCOUNTER — Encounter: Payer: Self-pay | Admitting: Internal Medicine

## 2022-04-04 ENCOUNTER — Ambulatory Visit: Payer: 59 | Attending: Internal Medicine

## 2022-04-04 VITALS — BP 100/68 | HR 83 | Ht 63.0 in | Wt 146.4 lb

## 2022-04-04 DIAGNOSIS — R072 Precordial pain: Secondary | ICD-10-CM | POA: Diagnosis not present

## 2022-04-04 DIAGNOSIS — J441 Chronic obstructive pulmonary disease with (acute) exacerbation: Secondary | ICD-10-CM | POA: Diagnosis not present

## 2022-04-04 DIAGNOSIS — R002 Palpitations: Secondary | ICD-10-CM | POA: Diagnosis not present

## 2022-04-04 DIAGNOSIS — R0609 Other forms of dyspnea: Secondary | ICD-10-CM

## 2022-04-04 DIAGNOSIS — J45901 Unspecified asthma with (acute) exacerbation: Secondary | ICD-10-CM

## 2022-04-04 MED ORDER — IVABRADINE HCL 7.5 MG PO TABS: ORAL_TABLET | ORAL | 0 refills | Status: DC

## 2022-04-04 NOTE — Patient Instructions (Addendum)
Medication Instructions:  Your physician recommends that you continue on your current medications as directed. Please refer to the Current Medication list given to you today.  *If you need a refill on your cardiac medications before your next appointment, please call your pharmacy*   Lab Work: TODAY: BMP If you have labs (blood work) drawn today and your tests are completely normal, you will receive your results only by: [place] (if you have MyChart) OR A paper copy in the mail If you have any lab test that is abnormal or we need to change your treatment, we will call you to review the results.   Testing/Procedures: Your physician has requested that you have an echocardiogram. Echocardiography is a painless test that uses sound waves to create images of your heart. It provides your doctor with information about the size and shape of your heart and how well your heart's chambers and valves are working. This procedure takes approximately one hour. There are no restrictions for this procedure. Please do NOT wear cologne, perfume, aftershave, or lotions (deodorant is allowed). Please arrive 15 minutes prior to your appointment time.  Your physician has requested that you have a Cardiac CT.   Your physician has requested that you wear a 7 day heart monitor.    Follow-Up: At [facility], you and your health needs are our priority.  As part of our continuing mission to provide you with exceptional heart care, we have created designated Provider Care Teams.  These Care Teams include your primary Cardiologist (physician) and Advanced Practice Providers (APPs -  Physician Assistants and Nurse Practitioners) who all work together to provide you with the care you need, when you need it.  We recommend signing up for the patient portal called "MyChart".  Sign up information is provided on this After Visit Summary.  MyChart is used to connect with patients for Virtual Visits  (Telemedicine).  Patients are able to view lab/test results, encounter notes, upcoming appointments, etc.  Non-urgent messages can be sent to your provider as well.   To learn more about what you can do with MyChart, go to [email or web address].    Your next appointment:   3-4 month(s)  The format for your next appointment:   In Person  Provider:   Werner Lean, MD  or Melina Copa, PA-C, Ambrose Pancoast, NP, or Ermalinda Barrios, PA-C      Other Instructions  Your cardiac CT will be scheduled at one of the below locations:   [facility] [address] [place], [place] [address] [phone]  [place], [place] [address] [phone]  [facility] [place], [place] [address] [phone]  If scheduled at [facility], please arrive at the [facility] and Children's Entrance (Entrance C2) of [facility] 30 minutes prior to test start time. You can use the FREE valet parking offered at entrance C (encouraged to control the heart rate for the test)  Proceed to the [facility] Radiology Department (first floor) to check-in and test prep.  All radiology patients and guests should use entrance C2 at [facility], accessed from [facility], even though the hospital's physical address listed is [address].    If scheduled at [facility] or [facility], please arrive 15 mins early for check-in and test prep.   Please follow these  instructions carefully (unless otherwise directed):   On the Night Before the Test: Be sure to Drink plenty of water. Do not consume any caffeinated/decaffeinated beverages or chocolate 12 hours prior to your test. Do not take any antihistamines 12 hours prior to your test. - - Flonase, Astelin, Breo   On the Day of the  Test: Drink plenty of water until 1 hour prior to the test. Do not eat any food 1 hour prior to test. You may take your regular medications prior to the test.  Take ivabradine (Corlanor) 15 mg by mouth two hours prior to test. HOLD Furosemide/Hydrochlorothiazide morning of the test. FEMALES- please wear underwire-free bra if available, avoid dresses & tight clothing       After the Test: Drink plenty of water. After receiving IV contrast, you may experience a mild flushed feeling. This is normal. On occasion, you may experience a mild rash up to 24 hours after the test. This is not dangerous. If this occurs, you can take Benadryl 25 mg and increase your fluid intake. If you experience trouble breathing, this can be serious. If it is severe call 911 IMMEDIATELY. If it is mild, please call our office. If you take any of these medications: Glipizide/Metformin, Avandament, Glucavance, please do not take 48 hours after completing test unless otherwise instructed.  We will call to schedule your test 2-4 weeks out understanding that some insurance companies will need an authorization prior to the service being performed.   For non-scheduling related questions, please contact the cardiac imaging nurse navigator should you have any questions/concerns: Marchia Bond, Cardiac Imaging Nurse Navigator Gordy Clement, Cardiac Imaging Nurse Navigator [facility] Direct Office Dial: [phone]   For scheduling needs, including cancellations and rescheduling, please call [place], [phone].      ZIO XT- Long Term Monitor Instructions  Your physician has requested you wear a ZIO patch monitor for 7 days.  This is a single patch monitor. [employer] supplies one patch monitor per enrollment. Additional stickers are not available. Please do not apply patch if you will be having a Nuclear Stress Test,  Echocardiogram, Cardiac CT, MRI, or Chest Xray during the period you would  be wearing the  monitor. The patch cannot be worn during these tests. You cannot remove and re-apply the  ZIO XT patch monitor.  Your ZIO patch monitor will be mailed 3 day USPS to your address on file. It may take 3-5 days  to receive your monitor after you have been enrolled.  Once you have received your monitor, please review the enclosed instructions. Your monitor  has already been registered assigning a specific monitor serial # to you.  Billing and Patient Assistance Program Information  We have supplied [employer] with any of your insurance information on file for billing purposes. [employer] offers a sliding scale Patient Assistance Program for patients that do not have  insurance, or whose insurance does not completely cover the cost of the ZIO monitor.  You must apply for the Patient Assistance Program to qualify for this discounted rate.  To apply, please call [employer] at [phone], select option 4, select option 2, ask to apply for  Patient Assistance Program. Theodore Demark will ask your household income, and how many people  are in your household. They will quote your out-of-pocket cost based on that information.  [employer] will also be able to set up a 63-month interest-free payment plan if needed.  Applying the monitor   Shave hair from upper  left chest.  Hold abrader disc by orange tab. Rub abrader in 40 strokes over the upper left chest as  indicated in your monitor instructions.  Clean area with 4 enclosed alcohol pads. Let dry.  Apply patch as indicated in monitor instructions. Patch will be placed under collarbone on left  side of chest with arrow pointing upward.  Rub patch adhesive wings for 2 minutes. Remove white label marked "1". Remove the white  label marked "2". Rub patch adhesive wings for 2 additional minutes.  While looking in a mirror, press and release button in center of patch. A small green light will  flash 3-4 times. This will be your only indicator that the  monitor has been turned on.  Do not shower for the first 24 hours. You may shower after the first 24 hours.  Press the button if you feel a symptom. You will hear a small click. Record Date, Time and  Symptom in the Patient Logbook.  When you are ready to remove the patch, follow instructions on the last 2 pages of Patient  Logbook. Stick patch monitor onto the last page of Patient Logbook.  Place Patient Logbook in the blue and white box. Use locking tab on box and tape box closed  securely. The blue and white box has prepaid postage on it. Please place it in the mailbox as  soon as possible. Your physician should have your test results approximately 7 days after the  monitor has been mailed back to [facility].  Call [place] at [phone] if you have questions regarding  your ZIO XT patch monitor. Call them immediately if you see an orange light blinking on your  monitor.  If your monitor falls off in less than 4 days, contact our Monitor department at [phone].  If your monitor becomes loose or falls off after 4 days call [employer] at [phone] for  suggestions on securing your monitor   Important Information About Sugar

## 2022-04-04 NOTE — Progress Notes (Unsigned)
Enrolled for [employer] to mail a ZIO XT long term holter monitor to the patients address on file.  

## 2022-04-05 ENCOUNTER — Telehealth: Payer: Self-pay | Admitting: Internal Medicine

## 2022-04-05 ENCOUNTER — Telehealth: Payer: Self-pay

## 2022-04-05 LAB — BASIC METABOLIC PANEL
BUN/Creatinine Ratio: 21 (ref 12–28)
BUN: 18 mg/dL (ref 8–27)
CO2: 24 mmol/L (ref 20–29)
Calcium: 10.1 mg/dL (ref 8.7–10.3)
Chloride: 96 mmol/L (ref 96–106)
Creatinine, Ser: 0.87 mg/dL (ref 0.57–1.00)
Glucose: 102 mg/dL — ABNORMAL HIGH (ref 70–99)
Potassium: 4.4 mmol/L (ref 3.5–5.2)
Sodium: 135 mmol/L (ref 134–144)
eGFR: 73 mL/min/{1.73_m2} (ref >59)

## 2022-04-05 NOTE — Telephone Encounter (Signed)
Pt called back, shared information regarding recent test results, per Dr. Gasper Sells.  No follow up required.

## 2022-04-05 NOTE — Telephone Encounter (Signed)
Left message to call [facility] back.

## 2022-04-05 NOTE — Progress Notes (Signed)
Pt aware of result and appreciative of call.  TDS

## 2022-04-05 NOTE — Telephone Encounter (Signed)
Patient was returning call for results. Please advise °

## 2022-04-05 NOTE — Telephone Encounter (Signed)
-----   Message from Werner Lean, MD sent at 04/05/2022  7:41 AM EDT ----- Testing Results WNL.  No change to diagnostic or therapeutic plan.

## 2022-04-08 ENCOUNTER — Ambulatory Visit (INDEPENDENT_AMBULATORY_CARE_PROVIDER_SITE_OTHER): Payer: 59 | Admitting: Nurse Practitioner

## 2022-04-08 ENCOUNTER — Ambulatory Visit (INDEPENDENT_AMBULATORY_CARE_PROVIDER_SITE_OTHER): Payer: 59

## 2022-04-08 ENCOUNTER — Encounter: Payer: Self-pay | Admitting: Nurse Practitioner

## 2022-04-08 VITALS — BP 122/70 | HR 82 | Ht 63.0 in | Wt 145.4 lb

## 2022-04-08 DIAGNOSIS — J441 Chronic obstructive pulmonary disease with (acute) exacerbation: Secondary | ICD-10-CM

## 2022-04-08 DIAGNOSIS — J45901 Unspecified asthma with (acute) exacerbation: Secondary | ICD-10-CM | POA: Diagnosis not present

## 2022-04-08 DIAGNOSIS — R918 Other nonspecific abnormal finding of lung field: Secondary | ICD-10-CM

## 2022-04-08 LAB — POCT EXHALED NITRIC OXIDE: FeNO level (ppb): 15

## 2022-04-08 MED ORDER — FLUTICASONE PROPIONATE 50 MCG/ACT NA SUSP: NASAL | 12 refills | Status: DC

## 2022-04-08 MED ORDER — BENZONATATE 200 MG PO CAPS: 200.0000 mg | ORAL_CAPSULE | Freq: Three times a day (TID) | ORAL | 1 refills | Status: DC | PRN

## 2022-04-08 MED ORDER — PREDNISONE 10 MG PO TABS: ORAL_TABLET | ORAL | 0 refills | Status: DC

## 2022-04-08 MED ORDER — TRELEGY ELLIPTA 200-62.5-25 MCG/ACT IN AEPB: 1.0000 | INHALATION_SPRAY | Freq: Every day | RESPIRATORY_TRACT | 0 refills | Status: DC

## 2022-04-08 NOTE — Assessment & Plan Note (Signed)
Considered benign due to stability. She does have a significant smoking history but quit >15 years ago so does not qualify for lung cancer screening program. Recommended with continue to monitor with annual screenings; next CT due 12/2022.  

## 2022-04-08 NOTE — Progress Notes (Signed)
$'@Patient'j$  ID: Kelsey Duran, female    DOB: [birth year], 66 y.o.   MRN: [id number]  Chief Complaint  Patient presents with   Follow-up    Pt has a dry cough, wheezing, SOB, tightness in chest X1 week    Referring provider: Glenis Smoker, *  HPI: 66 year old female, former smoker (30 pack years) followed for asthma with COPD, previously on Xolair, and lung nodules. She is a patient of Dr. Janee Morn and last seen in office on 12/09/2021 by [facility] NP. She was recently diagnosed with Stage IIA breast cancer and underwent bilateral mastectomies in April; currently on letrozole. Past medical history significant for HTN, GERD, seasonal allergic rhinitis.  TEST/EVENTS:  11/03/2014 PFTs: FVC 75, FEV1 56, ratio 59, TLC 118%, DLCOunc 93%. Moderate obstructive airway disease with reversibility  09/15/2020 CT chest wo con: no LAD. Moderate hiatal hernia. There is lingular nodularity with small nodules throughout the lingula similar to the prior study. Airways are patent. There are multiple nodules, largest 8x6 mm; all of which are stable over 1 year interval.   08/31/2021: OV with Dr. Annamaria Boots. Rarely requires albuterol. Doing well on Breo. CT scan of the chest with nodules showing benign behavior; referred to lung cancer screening program.   12/09/2021: [place] with Adajah Cocking NP for new cough and increased SOB that started two weeks ago. Today, she reports that she began having problems when we had poor air quality. She noticed her chest was tighter and she was wheezing more. She then began having increased shortness of breath, which has made her not want to eat much. She continues to have a persistent, productive cough with beige phlegm.  AECOPD/asthma with acute bronchitis. CXR with prominence of interstitial markings, concerning for bronchitis vs pna. Bronchospasm on exam. Depo 80 mg inj x 1 and duoneb in office. Prednisone taper and cefpodoxime course. Continue ICS/LABA and prn albuterol. Cough control regimen  advised.   04/08/2022: Today - follow up Patient presents today for acute visit. She was recently seen by a new cardiologist, Dr. Gasper Sells, on 10/16 for palpitations. He reached out to [place] concerned that she was having worsening respiratory symptoms. Today, she tells me that she has had increased cough and shortness of breath for the past week. Cough was initially productive, with yellow sputum, but over the past few days, she feels like her chest tightness has gotten worse and she hasn't been able to get the phlegm up as easily. She's also wheezing. Denies any known sick exposures. No URI symptoms. She denies fevers, chills, night sweats, hemoptysis, leg swelling, orthopnea, PND. She is using Breo 200 daily. Using rescue multiple times a day.  Allergies  Allergen Reactions   Pistachio Nut Extract Skin Test Anaphylaxis   Biaxin [Clarithromycin]     GI UPSET   Codeine Other (See Comments)   Penicillins    Tetracycline    Valsartan     Other reaction(s): Other (See Comments), Unknown    Immunization History  Administered Date(s) Administered   Influenza Split 03/21/2011, 03/27/2012, 04/11/2012, 05/01/2013, 03/28/2014, 03/30/2015, 03/20/2017, 03/28/2018   Influenza,inj,Quad PF,6+ Mos 04/11/2013, 03/21/2015, 03/22/2016, 03/01/2019, 03/02/2020, 02/17/2021   PFIZER(Purple Top)SARS-COV-2 Vaccination 09/11/2019, 10/04/2019, 06/15/2020   Pneumococcal Polysaccharide-23 03/01/2019   Tdap 03/21/2012, 07/16/2013, 04/08/2017, 11/25/2020    Past Medical History:  Diagnosis Date   ASTHMA 09/01/2009   HYPERTENSION 09/08/2009   LUNG NODULE 09/08/2009    Tobacco History: Social History   Tobacco Use  Smoking Status Former   Packs/day: 2.00  Years: 15.00   Total pack years: 30.00   Types: Cigarettes   Quit date: 06/21/1983   Years since quitting: 38.8  Smokeless Tobacco Never   Counseling given: Not Answered   Outpatient Medications Prior to Visit  Medication Sig Dispense Refill    acetaminophen (TYLENOL) 500 MG tablet Take by mouth as needed.     albuterol (PROAIR HFA) 108 (90 Base) MCG/ACT inhaler Inhale 2 puffs every 6 hours as needed 54 g 3   ALPRAZolam (XANAX) 0.5 MG tablet Take 1 tablet (0.5 mg total) by mouth 2 (two) times daily as needed. 60 tablet 3   anastrozole (ARIMIDEX) 1 MG tablet Take 1 mg by mouth daily.     azelastine (ASTELIN) 0.1 % nasal spray USE 1 TO 2 SPRAYS IN EACH NOSTRIL ONCE DAILY AT BEDTIME 90 mL 3   buPROPion (WELLBUTRIN XL) 150 MG 24 hr tablet Take 75 mg by mouth every morning. 1/2 tab     calcium carbonate (OS-CAL) 600 MG TABS tablet Take 600 mg by mouth 2 (two) times daily with a meal.     clobetasol (TEMOVATE) 0.05 % cream Apply topically 2 (two) times daily. (Patient taking differently: Apply topically as needed (rash).) 69 g 4   famotidine (PEPCID) 20 MG tablet Take by mouth as needed for heartburn.     fluticasone furoate-vilanterol (BREO ELLIPTA) 200-25 MCG/ACT AEPB INHALE 1 PUFF INTO THE LUNGS DAILY 60 each 11   ivabradine (CORLANOR) 7.5 MG TABS tablet Take 2 hours prior to Cardiac CT 2 tablet 0   latanoprost (XALATAN) 0.005 % ophthalmic solution      magnesium oxide (MAG-OX) 400 MG tablet Take 400 mg by mouth daily.     Multiple Vitamin (MULTIVITAMIN) tablet Take 1 tablet by mouth daily.     NONFORMULARY OR COMPOUNDED ITEM Allergy Vaccine 1:10 Given at [facility]     olmesartan-hydrochlorothiazide (BENICAR HCT) 20-12.5 MG tablet      Omega-3 Fatty Acids (FISH OIL) 1000 MG CPDR Take 1 capsule by mouth daily.     traZODone (DESYREL) 50 MG tablet Take 100 mg by mouth at bedtime as needed.     triamcinolone cream (KENALOG) 0.1 % Apply 1 Application topically as needed for rash.     fluticasone (FLONASE) 50 MCG/ACT nasal spray 1 puff each nostril twice daiy (Patient taking differently: as needed for allergies. 1 puff each nostril twice daiy) 16 g 12   Facility-Administered Medications Prior to Visit  Medication Dose Route  Frequency Provider Last Rate Last Admin   ipratropium-albuterol (DUONEB) 0.5-2.5 (3) MG/3ML nebulizer solution 3 mL  3 mL Nebulization Q4H PRN Chara Marquard, Karie Schwalbe, NP   3 mL at 12/09/21 1139     Review of Systems:   Constitutional: No weight loss or gain, night sweats, fevers, chills, fatigue, or lassitude. HEENT: No headaches, difficulty swallowing, tooth/dental problems, or sore throat. No sneezing, itching, ear ache, nasal congestion, or post nasal drip CV:  +palpitations. No chest pain, orthopnea, PND, swelling in lower extremities, anasarca, dizziness, syncope Resp: +shortness of breath with exertion; congested cough; wheezing; chest tightness.  No hemoptysis.  No chest wall deformity GI:  No heartburn, indigestion, abdominal pain, nausea, vomiting, diarrhea, change in bowel habits, loss of appetite, bloody stools.  Skin: No rash, lesions, ulcerations MSK:  No joint pain or swelling.  No decreased range of motion.  No back pain. Neuro: No dizziness or lightheadedness.  Psych: No depression or anxiety. Mood stable.     Physical Exam:  BP  122/70 (BP Location: Right Arm, Patient Position: Sitting, Cuff Size: Normal)   Pulse 82   Ht '5\' 3"'$  (1.6 m)   Wt 145 lb 6.4 oz (66 kg)   SpO2 95%   BMI 25.76 kg/m   GEN: Pleasant, interactive, well-appearing; in no acute distress. HEENT:  Normocephalic and atraumatic. EACs patent bilaterally. TM pearly gray with present light reflex bilaterally. PERRLA. Sclera white. Nasal turbinates pink, moist and patent bilaterally. No rhinorrhea present. Oropharynx pink and moist, without exudate or edema. No lesions, ulcerations, or postnasal drip.  NECK:  Supple w/ fair ROM. No JVD present. Normal carotid impulses w/o bruits. Thyroid symmetrical with no goiter or nodules palpated. No lymphadenopathy.   CV: RRR, no m/r/g, no peripheral edema. Pulses intact, +2 bilaterally. No cyanosis, pallor or clubbing. PULMONARY:  Unlabored, regular breathing. Scattered  wheezes bilaterally A&P. Congested, bronchitic cough. No accessory muscle use. No dullness to percussion. GI: BS present and normoactive. Soft, non-tender to palpation. No organomegaly or masses detected. MSK: No erythema, warmth or tenderness. No deformities or joint swelling noted.  Neuro: A/Ox3. No focal deficits noted.   Skin: Warm, no lesions or rashe Psych: Normal affect and behavior. Judgement and thought content appropriate.     Lab Results:  CBC    Component Value Date/Time   WBC 7.3 08/05/2014 1006   RBC 4.16 08/05/2014 1006   HGB 14.1 08/05/2014 1006   HCT 40.3 08/05/2014 1006   PLT 272.0 08/05/2014 1006   MCV 96.9 08/05/2014 1006   MCHC 34.9 08/05/2014 1006   RDW 12.2 08/05/2014 1006   LYMPHSABS 2.0 08/05/2014 1006   MONOABS 0.6 08/05/2014 1006   EOSABS 0.5 08/05/2014 1006   BASOSABS 0.1 08/05/2014 1006    BMET    Component Value Date/Time   NA 135 04/04/2022 1604   K 4.4 04/04/2022 1604   CL 96 04/04/2022 1604   CO2 24 04/04/2022 1604   GLUCOSE 102 (H) 04/04/2022 1604   BUN 18 04/04/2022 1604   CREATININE 0.87 04/04/2022 1604   CALCIUM 10.1 04/04/2022 1604    BNP No results found for: "BNP"   Imaging:  No results found.        Latest Ref Rng & Units 11/03/2014    9:07 AM  PFT Results  FVC-Pre L 2.37   FVC-Predicted Pre % 75   FVC-Post L 2.65   FVC-Predicted Post % 84   Pre FEV1/FVC % % 58   Post FEV1/FCV % % 59   FEV1-Pre L 1.38   FEV1-Predicted Pre % 56   FEV1-Post L 1.58   DLCO uncorrected ml/min/mmHg 20.82   DLCO UNC% % 93   DLVA Predicted % 105   TLC L 5.74   TLC % Predicted % 118   RV % Predicted % 169     No results found for: "NITRICOXIDE"      Assessment & Plan:   Asthma with COPD with exacerbation (HCC) AECOPD/asthma. VS stable and non-toxic. Prednisone taper. CXR to rule out superimposed infection. Step up to triple therapy regimen. PRN albuterol. We discussed COPD disease prognosis and progression in depth today.    Patient Instructions  Stop Breo. Start Trelegy 200 1 puff daily. Brush tongue and rinse mouth afterwards Continue Albuterol inhaler 2 puffs every 6 hours as needed for shortness of breath or wheezing. Notify if symptoms persist despite rescue inhaler/neb use. Continue flonase nasal spray 1 spray each nostril twice daily  Continue astelin nasal spray 1-2 sprays each nostril daily  Prednisone taper. 4 tabs for 2 days, then 3 tabs for 2 days, 2 tabs for 2 days, then 1 tab for 2 days, then stop. Take in AM with food.  Mucinex [phone] mg Twice daily for chest congestion/cough Tessalon perles (benzonatate) 1 capsule Three times a day for cough   Chest x ray today    Follow up in two weeks with Dr. Annamaria Boots or [name] Temima Kutsch,NP. If symptoms do not improve or worsen, please contact office for sooner follow up or seek emergency care.    Lung nodules Considered benign due to stability. She does have a significant smoking history but quit >15 years ago so does not qualify for lung cancer screening program. Recommended with continue to monitor with annual screenings; next CT due 12/2022.   I spent 35 minutes of dedicated to the care of this patient on the date of this encounter to include pre-visit review of records, face-to-face time with the patient discussing conditions above, post visit ordering of testing, clinical documentation with the electronic health record, making appropriate referrals as documented, and communicating necessary findings to members of the patients care team.  Clayton Bibles, NP 04/08/2022  Pt aware and understands NP's role.

## 2022-04-08 NOTE — Patient Instructions (Addendum)
Stop Breo. Start Trelegy 200 1 puff daily. Brush tongue and rinse mouth afterwards Continue Albuterol inhaler 2 puffs every 6 hours as needed for shortness of breath or wheezing. Notify if symptoms persist despite rescue inhaler/neb use. Continue flonase nasal spray 1 spray each nostril twice daily  Continue astelin nasal spray 1-2 sprays each nostril daily    Prednisone taper. 4 tabs for 2 days, then 3 tabs for 2 days, 2 tabs for 2 days, then 1 tab for 2 days, then stop. Take in AM with food.  Mucinex [phone] mg Twice daily for chest congestion/cough Tessalon perles (benzonatate) 1 capsule Three times a day for cough   Chest x ray today    Follow up in two weeks with Dr. Annamaria Boots or [name] Rithvik Orcutt,NP. If symptoms do not improve or worsen, please contact office for sooner follow up or seek emergency care.

## 2022-04-08 NOTE — Assessment & Plan Note (Signed)
AECOPD/asthma. VS stable and non-toxic. Prednisone taper. CXR to rule out superimposed infection. Step up to triple therapy regimen. PRN albuterol. We discussed COPD disease prognosis and progression in depth today.   Patient Instructions  Stop Breo. Start Trelegy 200 1 puff daily. Brush tongue and rinse mouth afterwards Continue Albuterol inhaler 2 puffs every 6 hours as needed for shortness of breath or wheezing. Notify if symptoms persist despite rescue inhaler/neb use. Continue flonase nasal spray 1 spray each nostril twice daily  Continue astelin nasal spray 1-2 sprays each nostril daily    Prednisone taper. 4 tabs for 2 days, then 3 tabs for 2 days, 2 tabs for 2 days, then 1 tab for 2 days, then stop. Take in AM with food.  Mucinex [phone] mg Twice daily for chest congestion/cough Tessalon perles (benzonatate) 1 capsule Three times a day for cough   Chest x ray today    Follow up in two weeks with Dr. Annamaria Boots or [name] Qaadir Kent,NP. If symptoms do not improve or worsen, please contact office for sooner follow up or seek emergency care.

## 2022-04-21 ENCOUNTER — Encounter: Payer: Self-pay | Admitting: Nurse Practitioner

## 2022-04-21 ENCOUNTER — Ambulatory Visit (HOSPITAL_COMMUNITY): Payer: 59 | Attending: Internal Medicine

## 2022-04-21 ENCOUNTER — Ambulatory Visit (INDEPENDENT_AMBULATORY_CARE_PROVIDER_SITE_OTHER): Payer: 59 | Admitting: Nurse Practitioner

## 2022-04-21 VITALS — BP 110/70 | HR 82 | Ht 62.0 in | Wt 146.8 lb

## 2022-04-21 DIAGNOSIS — J441 Chronic obstructive pulmonary disease with (acute) exacerbation: Secondary | ICD-10-CM

## 2022-04-21 DIAGNOSIS — J45901 Unspecified asthma with (acute) exacerbation: Secondary | ICD-10-CM

## 2022-04-21 DIAGNOSIS — J3089 Other allergic rhinitis: Secondary | ICD-10-CM

## 2022-04-21 DIAGNOSIS — J4489 Other specified chronic obstructive pulmonary disease: Secondary | ICD-10-CM

## 2022-04-21 DIAGNOSIS — R002 Palpitations: Secondary | ICD-10-CM | POA: Diagnosis not present

## 2022-04-21 DIAGNOSIS — R918 Other nonspecific abnormal finding of lung field: Secondary | ICD-10-CM

## 2022-04-21 DIAGNOSIS — R072 Precordial pain: Secondary | ICD-10-CM | POA: Diagnosis present

## 2022-04-21 DIAGNOSIS — J302 Other seasonal allergic rhinitis: Secondary | ICD-10-CM

## 2022-04-21 LAB — ECHOCARDIOGRAM COMPLETE
Area-P 1/2: 2.77 cm2
Height: 62 in
S' Lateral: 2 cm
Weight: 2348.8 oz

## 2022-04-21 NOTE — Assessment & Plan Note (Addendum)
Asthma/COPD overlap with moderate obstruction. She has been treated for recurrent exacerbations recently. Advised to step up to triple therapy regimen with Trelegy at previous visit. She still has samples at home. Discussed it is okay to start this prior to echo, which is scheduled today anyways. She will stop Breo and trial the change. Agreed to notify if she receives benefit so we can send new rx. Previously on biologic therapy; no significant response to Xolair. She does have allergic phenotype. Repeat CBC with diff on follow up and may consider restarting biologic therapy. Activity encouraged, as tolerated.   Patient Instructions  Start Trelegy 200 1 puff daily. Brush tongue and rinse mouth afterwards Continue Albuterol inhaler 2 puffs every 6 hours as needed for shortness of breath or wheezing. Notify if symptoms persist despite rescue inhaler/neb use. Continue flonase nasal spray 1 spray each nostril twice daily  Continue astelin nasal spray 1-2 sprays each nostril daily  Continue Mucinex [phone] mg Twice daily for chest congestion/cough Continue Tessalon perles (benzonatate) 1 capsule Three times a day for cough   Follow up in six weeks with Dr. Annamaria Boots or [name] Sircharles Holzheimer,NP. If symptoms do not improve or worsen, please contact office for sooner follow up or seek emergency care.

## 2022-04-21 NOTE — Assessment & Plan Note (Signed)
Stable today. Continue nasal sprays and prn mucinex.

## 2022-04-21 NOTE — Progress Notes (Signed)
$'@Patient'z$  ID: Kelsey Duran, female    DOB: [birth year], 67 y.o.   MRN: [id number]  Chief Complaint  Patient presents with   Follow-up    Pt f/u she is feeling some better, she states that all her symptoms are baseline. Using Breo and albuterol (not started Trelegy)    Referring provider: Glenis Smoker, *  HPI: 66 year old female, former smoker (30 pack years) followed for asthma with COPD, previously on Xolair, and lung nodules. She is a patient of Dr. Janee Morn and last seen in office on 04/08/2022 by [facility] NP. She was recently diagnosed with Stage IIA breast cancer and underwent bilateral mastectomies in April; currently on letrozole. Past medical history significant for HTN, GERD, seasonal allergic rhinitis.  TEST/EVENTS:  11/03/2014 PFTs: FVC 75, FEV1 56, ratio 59, TLC 118%, DLCOunc 93%. Moderate obstructive airway disease with reversibility  09/15/2020 CT chest wo con: no LAD. Moderate hiatal hernia. There is lingular nodularity with small nodules throughout the lingula similar to the prior study. Airways are patent. There are multiple nodules, largest 8x6 mm; all of which are stable over 1 year interval.   08/31/2021: OV with Dr. Annamaria Boots. Rarely requires albuterol. Doing well on Breo. CT scan of the chest with nodules showing benign behavior; referred to lung cancer screening program.   12/09/2021: Ok Edwards with Tarus Briski NP for new cough and increased SOB that started two weeks ago. Today, she reports that she began having problems when we had poor air quality. She noticed her chest was tighter and she was wheezing more. She then began having increased shortness of breath, which has made her not want to eat much. She continues to have a persistent, productive cough with beige phlegm.  AECOPD/asthma with acute bronchitis. CXR with prominence of interstitial markings, concerning for bronchitis vs pna. Bronchospasm on exam. Depo 80 mg inj x 1 and duoneb in office. Prednisone taper and cefpodoxime  course. Continue ICS/LABA and prn albuterol. Cough control regimen advised.   04/08/2022: OV with Heavyn Yearsley NP for acute visit. She was recently seen by a new cardiologist, Dr. Gasper Sells, on 10/16 for palpitations. He reached out to [place] concerned that she was having worsening respiratory symptoms. Today, she tells me that she has had increased cough and shortness of breath for the past week. Cough was initially productive, with yellow sputum, but over the past few days, she feels like her chest tightness has gotten worse and she hasn't been able to get the phlegm up as easily. She's also wheezing. Treated with prednisone taper. CXR clear. Step up to Trelegy. Cough control measures.   04/21/2022: Today - follow up Patient presents today for follow up. She is feeling better since completing prednisone course. Cough has mostly resolved; still occasional, dry cough. Her breathing feels back to baseline. She denies increased chest congestion or wheezing. She has yet to start Trelegy; she was unsure if she should wait until after her echocardiogram, which is scheduled for today. She was on Xolair in the past for asthma; didn't have much response to it.   Allergies  Allergen Reactions   P[place]io Nut Extract Skin Test Anaphylaxis   Biaxin [Clarithromycin]     GI UPSET   Codeine Other (See Comments)   Penicillins    Tetracycline    Valsartan     Other reaction(s): Other (See Comments), Unknown    Immunization History  Administered Date(s) Administered   Influenza Split 03/21/2011, 03/27/2012, 04/11/2012, 05/01/2013, 03/28/2014, 03/30/2015, 03/20/2017, 03/28/2018   Influenza,inj,Quad PF,6+  Mos 04/11/2013, 03/21/2015, 03/22/2016, 03/01/2019, 03/02/2020, 02/17/2021   PFIZER(Purple Top)SARS-COV-2 Vaccination 09/11/2019, 10/04/2019, 06/15/2020   Pneumococcal Polysaccharide-23 03/01/2019   Tdap 03/21/2012, 07/16/2013, 04/08/2017, 11/25/2020    Past Medical History:  Diagnosis Date   ASTHMA 09/01/2009    HYPERTENSION 09/08/2009   LUNG NODULE 09/08/2009    Tobacco History: Social History   Tobacco Use  Smoking Status Former   Packs/day: 2.00   Years: 15.00   Total pack years: 30.00   Types: Cigarettes   Quit date: 06/21/1983   Years since quitting: 38.8  Smokeless Tobacco Never   Counseling given: Not Answered   Outpatient Medications Prior to Visit  Medication Sig Dispense Refill   acetaminophen (TYLENOL) 500 MG tablet Take by mouth as needed.     albuterol (PROAIR HFA) 108 (90 Base) MCG/ACT inhaler Inhale 2 puffs every 6 hours as needed 54 g 3   ALPRAZolam (XANAX) 0.5 MG tablet Take 1 tablet (0.5 mg total) by mouth 2 (two) times daily as needed. 60 tablet 3   anastrozole (ARIMIDEX) 1 MG tablet Take 1 mg by mouth daily.     azelastine (ASTELIN) 0.1 % nasal spray USE 1 TO 2 SPRAYS IN EACH NOSTRIL ONCE DAILY AT BEDTIME 90 mL 3   benzonatate (TESSALON) 200 MG capsule Take 1 capsule (200 mg total) by mouth 3 (three) times daily as needed. 30 capsule 1   buPROPion (WELLBUTRIN XL) 150 MG 24 hr tablet Take 75 mg by mouth every morning. 1/2 tab     calcium carbonate (OS-CAL) 600 MG TABS tablet Take 600 mg by mouth 2 (two) times daily with a meal.     clobetasol (TEMOVATE) 0.05 % cream Apply topically 2 (two) times daily. (Patient taking differently: Apply topically as needed (rash).) 69 g 4   famotidine (PEPCID) 20 MG tablet Take by mouth as needed for heartburn.     fluticasone (FLONASE) 50 MCG/ACT nasal spray 1 puff each nostril twice daiy 16 g 12   fluticasone furoate-vilanterol (BREO ELLIPTA) 200-25 MCG/ACT AEPB INHALE 1 PUFF INTO THE LUNGS DAILY 60 each 11   Fluticasone-Umeclidin-Vilant (TRELEGY ELLIPTA) 200-62.5-25 MCG/ACT AEPB Inhale 1 puff into the lungs daily. 2 each 0   ivabradine (CORLANOR) 7.5 MG TABS tablet Take 2 hours prior to Cardiac CT 2 tablet 0   latanoprost (XALATAN) 0.005 % ophthalmic solution      magnesium oxide (MAG-OX) 400 MG tablet Take 400 mg by mouth daily.      Multiple Vitamin (MULTIVITAMIN) tablet Take 1 tablet by mouth daily.     NONFORMULARY OR COMPOUNDED ITEM Allergy Vaccine 1:10 Given at [facility]     olmesartan-hydrochlorothiazide (BENICAR HCT) 20-12.5 MG tablet      Omega-3 Fatty Acids (FISH OIL) 1000 MG CPDR Take 1 capsule by mouth daily.     predniSONE (DELTASONE) 10 MG tablet 4 tabs for 2 days, then 3 tabs for 2 days, 2 tabs for 2 days, then 1 tab for 2 days, then stop 20 tablet 0   traZODone (DESYREL) 50 MG tablet Take 100 mg by mouth at bedtime as needed.     triamcinolone cream (KENALOG) 0.1 % Apply 1 Application topically as needed for rash.     Facility-Administered Medications Prior to Visit  Medication Dose Route Frequency Provider Last Rate Last Admin   ipratropium-albuterol (DUONEB) 0.5-2.5 (3) MG/3ML nebulizer solution 3 mL  3 mL Nebulization Q4H PRN Shaindel Sweeten, Karie Schwalbe, NP   3 mL at 12/09/21 1139     Review of Systems:  Constitutional: No weight loss or gain, night sweats, fevers, chills, fatigue, or lassitude. HEENT: No headaches, difficulty swallowing, tooth/dental problems, or sore throat. No sneezing, itching, ear ache, nasal congestion, or post nasal drip CV:  +palpitations. No chest pain, orthopnea, PND, swelling in lower extremities, anasarca, dizziness, syncope Resp: +shortness of breath with exertion (baseline); occasional dry cough. No wheezing.  No hemoptysis.  No chest wall deformity GI:  No heartburn, indigestion, abdominal pain, nausea, vomiting, diarrhea, change in bowel habits, loss of appetite, bloody stools.  Skin: No rash, lesions, ulcerations MSK:  No joint pain or swelling.  No decreased range of motion.  No back pain. Neuro: No dizziness or lightheadedness.  Psych: No depression or anxiety. Mood stable.     Physical Exam:  BP 110/70   Pulse 82   Ht '5\' 2"'$  (1.575 m)   Wt 146 lb 12.8 oz (66.6 kg)   SpO2 98%   BMI 26.85 kg/m   GEN: Pleasant, interactive, well-appearing; in no acute  distress. HEENT:  Normocephalic and atraumatic. PERRLA. Sclera white. Nasal turbinates pink, moist and patent bilaterally. No rhinorrhea present. Oropharynx pink and moist, without exudate or edema. No lesions, ulcerations, or postnasal drip.  NECK:  Supple w/ fair ROM. No JVD present. Normal carotid impulses w/o bruits. Thyroid symmetrical with no goiter or nodules palpated. No lymphadenopathy.   CV: RRR, no m/r/g, no peripheral edema. Pulses intact, +2 bilaterally. No cyanosis, pallor or clubbing. PULMONARY:  Unlabored, regular breathing. Clear bilaterally w/o wheezes/rales/rhonchi. No accessory muscle use. No dullness to percussion. GI: BS present and normoactive. Soft, non-tender to palpation. No organomegaly or masses detected. MSK: No erythema, warmth or tenderness. No deformities or joint swelling noted.  Neuro: A/Ox3. No focal deficits noted.   Skin: Warm, no lesions or rashe Psych: Normal affect and behavior. Judgement and thought content appropriate.     Lab Results:  CBC    Component Value Date/Time   WBC 7.3 08/05/2014 1006   RBC 4.16 08/05/2014 1006   HGB 14.1 08/05/2014 1006   HCT 40.3 08/05/2014 1006   PLT 272.0 08/05/2014 1006   MCV 96.9 08/05/2014 1006   MCHC 34.9 08/05/2014 1006   RDW 12.2 08/05/2014 1006   LYMPHSABS 2.0 08/05/2014 1006   MONOABS 0.6 08/05/2014 1006   EOSABS 0.5 08/05/2014 1006   BASOSABS 0.1 08/05/2014 1006    BMET    Component Value Date/Time   NA 135 04/04/2022 1604   K 4.4 04/04/2022 1604   CL 96 04/04/2022 1604   CO2 24 04/04/2022 1604   GLUCOSE 102 (H) 04/04/2022 1604   BUN 18 04/04/2022 1604   CREATININE 0.87 04/04/2022 1604   CALCIUM 10.1 04/04/2022 1604    BNP No results found for: "BNP"   Imaging:  DG Chest 2 View  Result Date: 04/10/2022 CLINICAL DATA:  Shortness of breath. EXAM: CHEST - 2 VIEW COMPARISON:  12/09/2021 FINDINGS: The heart size and mediastinal contours are within normal limits. Chronic pulmonary  interstitial prominence remains stable. No evidence of acute infiltrate or pleural effusion. Thoracic spine degenerative changes and mild scoliosis again noted. IMPRESSION: Stable chronic pulmonary interstitial prominence. No acute findings. Electronically Signed   By: Marlaine Hind M.D.   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 She has been treated for recurrent exacerbations recently. Advised to step up to triple therapy regimen with Trelegy at previous visit. She still has samples at home. Discussed it is okay to start this prior to echo, which is scheduled today anyways. She will stop Breo and trial the change. Agreed to notify if she receives benefit so we can send new rx. Previously on biologic therapy; no significant response to Xolair. She does have allergic phenotype. Repeat CBC with diff on follow up and may consider restarting biologic therapy. Activity encouraged, as tolerated.   Patient Instructions  Start Trelegy 200 1 puff daily. Brush tongue and rinse mouth afterwards Continue Albuterol inhaler 2 puffs every 6 hours as needed for shortness of breath or wheezing. Notify if symptoms persist despite rescue inhaler/neb use. Continue flonase nasal spray 1 spray each nostril twice daily  Continue astelin nasal spray 1-2 sprays each nostril daily  Continue Mucinex [phone] mg Twice daily for chest congestion/cough Continue Tessalon perles (benzonatate) 1  capsule Three times a day for cough   Follow up in six weeks with Dr. Annamaria Boots or [name] Guilherme Schwenke,NP. If symptoms do not improve or worsen, please contact office for sooner follow up or seek emergency care.   Lung nodules Considered benign due to stability. She does have a significant smoking history but quit >15 years ago so does not qualify for lung cancer screening program. Recommended with continue to monitor with annual screenings; next CT due 12/2022.   Seasonal and perennial allergic rhinitis Stable today. Continue nasal sprays and prn mucinex.     I spent 28 minutes of dedicated to the care of this patient on the date of this encounter to include pre-visit review of records, face-to-face time with the patient discussing conditions above, post visit ordering of testing, clinical documentation with the electronic health record, making appropriate referrals as documented, and communicating necessary findings to members of the patients care team.  Clayton Bibles, NP 04/21/2022  Pt aware and understands NP's role.

## 2022-04-21 NOTE — Patient Instructions (Addendum)
Start Trelegy 200 1 puff daily. Brush tongue and rinse mouth afterwards Continue Albuterol inhaler 2 puffs every 6 hours as needed for shortness of breath or wheezing. Notify if symptoms persist despite rescue inhaler/neb use. Continue flonase nasal spray 1 spray each nostril twice daily  Continue astelin nasal spray 1-2 sprays each nostril daily  Continue Mucinex [phone] mg Twice daily for chest congestion/cough Continue Tessalon perles (benzonatate) 1 capsule Three times a day for cough   Follow up in six weeks with Dr. Annamaria Boots or [name] Brigett Estell,NP. If symptoms do not improve or worsen, please contact office for sooner follow up or seek emergency care.

## 2022-04-21 NOTE — Assessment & Plan Note (Signed)
Considered benign due to stability. She does have a significant smoking history but quit >15 years ago so does not qualify for lung cancer screening program. Recommended with continue to monitor with annual screenings; next CT due 12/2022.

## 2022-05-05 ENCOUNTER — Telehealth (HOSPITAL_COMMUNITY): Payer: Self-pay | Admitting: *Deleted

## 2022-05-05 NOTE — Telephone Encounter (Signed)
Attempted to call patient regarding upcoming cardiac CT appointment. Voicemail box full and unable to leave a message.  Jandiel Magallanes RN Navigator Cardiac Imaging [facility] [phone] Office [phone] Cell  

## 2022-05-06 ENCOUNTER — Ambulatory Visit (HOSPITAL_COMMUNITY)
Admission: RE | Admit: 2022-05-06 | Discharge: 2022-05-06 | Disposition: A | Discharge status: Home / Self Care | Payer: 59 | Source: Ambulatory Visit | Attending: Internal Medicine | Admitting: Internal Medicine

## 2022-05-06 DIAGNOSIS — R072 Precordial pain: Secondary | ICD-10-CM | POA: Insufficient documentation

## 2022-05-06 MED ORDER — NITROGLYCERIN 0.4 MG SL SUBL
0.8000 mg | SUBLINGUAL_TABLET | Freq: Once | SUBLINGUAL | Status: AC
  Administered 2022-05-06: 0.8 mg via SUBLINGUAL

## 2022-05-06 MED ORDER — IOHEXOL 350 MG/ML SOLN
100.0000 mL | Freq: Once | INTRAVENOUS | Status: AC | PRN
  Administered 2022-05-06: 100 mL via INTRAVENOUS

## 2022-05-06 MED ORDER — NITROGLYCERIN 0.4 MG SL SUBL
SUBLINGUAL_TABLET | SUBLINGUAL | Status: AC
  Filled 2022-05-06: qty 2

## 2022-05-06 NOTE — Progress Notes (Signed)
HPI Female former smoker followed for asthma complicated by hypertension and history of 3 lung nodules followed by Dr. Cyndia Bent.   PFT 01/13/2003-FEV1 2.5 L/92%, FEV1/FVC 0.76 PFT 11/03/14-moderately severe obstructive airways disease with air trapping, normal diffusion capacity, slight response to bronchodilator. FEV1 1.58/64%, FEV1/FVC 0.59, RV 169%, DLCO 93%. Allergy labs 08/05/14- broadly positive mites, cat, dog, grass, tree, weed pollens Total IgE 243, elevated for many foods Eosinophils 6.8%  -----------------------------------------------------   08/31/21- 66 year old female former smoker followed for asthma/COPD (quit Xolair-cost), allergic Rhinitis, complicated by HTN, history 3 lung nodules - saw Dr. Cyndia Bent, GERD, Glaucoma, Hiatal Hernia,   -Proair hfa, Breo 200, Flonase, Astelin,   Covid vax-3 Phizer Flu vax-had Avoiding LAMAs due to glaucoma R rarely needs rescue inhaler but does need Breo refilled.  No significant exacerbation. Discussed CT-nodules showing benign behavior.  We will see if she can be entered into the low-dose chest CT screening program ongoing. Discussed hiatal hernia seen on CT, and management of GERD which she says is getting worse.  Discussed reflux precautions and suggested she talk with her primary care physician about management options for hiatal hernia. She has moved to [facility] to be closer to her daughter. Pending bilateral breast biopsies for "highly suspicious" mammogram abnormality. CT chest 09/15/20 IMPRESSION: 1. Hiatal hernia extending into the chest. 2. Stable nodules, largest of which are stable since 2012 in the smaller nodules which are unchanged over 1 year interval compatible with benign pulmonary nodules   //consider a1AT assay, f/u PFT//  05/09/22-  66 year old female former smoker(30 pkyrs) followed for asthma/COPD (quit Xolair-cost), allergic Rhinitis, complicated by HTN, history 3 lung nodules - saw Dr. Cyndia Bent, GERD, Glaucoma, Hiatal  Hernia, Breast CA/ bil mastectomy, Palpitation,   -Proair hfa, Breo 200, Flonase, Astelin,   Covid vax-3 Phizer Flu vax-had Avoiding LAMAs due to glaucoma LOV Cobb, NP 04/21/22- Had AECOPD in June  Tried Trelegy- no better. No recent infection but she has trouble clearing her chest of congestion/wheeze/shortness of breath.  Cough is productive of thick white or sometimes light beige sputum.  No blood and no fever.  Feels controlled but does not clear. Having cardiac evaluation done and had echocardiogram. Coronary CT 05/06/22- 0 calcium CXR 04/10/22-  IMPRESSION: Stable chronic pulmonary interstitial prominence. No acute findings. CT chest 12/28/21- IMPRESSION: 1. Multiple nodules in the right lung with the largest nodule in the right lower lobe measuring 7 mm x 6 mm in comparison to 8 mm x 6 mm on the previous study. These nodules are stable going back to the study dated September 07, 2010. No new nodule seen. 2.  Moderate hiatal hernia. 3.  Severe lumbar spondylosis.  ROS- see HPI + = positive Constitutional:   No-   weight loss, night sweats, fevers, chills, fatigue, lassitude. HEENT:    headaches, no-difficulty swallowing, tooth/dental problems, sore throat,     No-sneezing, no- itching, ear ache, nasal congestion, post nasal drip,  CV:  No-chest pain, orthopnea, PND, swelling in lower extremities, anasarca, dizziness, +palpitations Resp: +  shortness of breath with exertion or at rest.            productive cough,    non-productive cough,  No-  coughing up of blood.             No-change in color of mucus.    +wheezing.   Skin: No-   rash or lesions. GI:  No-  heartburn, indigestion, no-abdominal pain, no-nausea, vomiting,  GU:  MS:  No-  joint pain or swelling.   Neuro- nothing unusual Psych:  No- change in mood or affect. + depression or anxiety.  No memory loss.  OBJ General- Alert, Oriented, Affect-appropriate, Distress- none acute Skin- clear Lymphadenopathy- none Head-  atraumatic            Eyes- Gross vision intact, PERRLA, conjunctivae clear secretions            Ears- Hearing, canals            Nose- Clear, no-Septal dev, mucus, polyps, erosion, perforation             Throat- Mallampati II , mucosa clear , drainage-none, tonsils- atrophic.  Neck- flexible , trachea midline, no stridor , thyroid nl, carotid no bruit Chest - symmetrical excursion , unlabored           Heart/CV- RRR , no murmur , no gallop  , no rub, nl s1 s2                           - JVD- none , edema- none, stasis changes- none, varices- none           Lung-   +distant breath sounds , +coarse,  dullness-none, rub- none, wheezing-none,             cough-none           Chest wall-  Abd-  Br/ Gen/ Rectal- Not done, not indicated Extrem- cyanosis- none, clubbing, none, atrophy- none, strength- nl Neuro- grossly intact to observation

## 2022-05-09 ENCOUNTER — Ambulatory Visit (INDEPENDENT_AMBULATORY_CARE_PROVIDER_SITE_OTHER): Payer: 59 | Admitting: Internal Medicine

## 2022-05-09 ENCOUNTER — Encounter: Payer: Self-pay | Admitting: Internal Medicine

## 2022-05-09 VITALS — BP 122/64 | HR 78 | Ht 63.0 in | Wt 148.0 lb

## 2022-05-09 DIAGNOSIS — Z23 Encounter for immunization: Secondary | ICD-10-CM

## 2022-05-09 DIAGNOSIS — J4489 Other specified chronic obstructive pulmonary disease: Secondary | ICD-10-CM | POA: Diagnosis not present

## 2022-05-09 DIAGNOSIS — R918 Other nonspecific abnormal finding of lung field: Secondary | ICD-10-CM | POA: Diagnosis not present

## 2022-05-09 LAB — CBC WITH DIFFERENTIAL/PLATELET
Basophils Absolute: 0.1 10*3/uL (ref 0.0–0.1)
Basophils Relative: 1.3 % (ref 0.0–3.0)
Eosinophils Absolute: 0.6 10*3/uL (ref 0.0–0.7)
Eosinophils Relative: 7.5 % — ABNORMAL HIGH (ref 0.0–5.0)
HCT: 43.1 % (ref 36.0–46.0)
Hemoglobin: 14.7 g/dL (ref 12.0–15.0)
Lymphocytes Relative: 24.6 % (ref 12.0–46.0)
Lymphs Abs: 1.8 10*3/uL (ref 0.7–4.0)
MCHC: 34.1 g/dL (ref 30.0–36.0)
MCV: 98.8 fl (ref 78.0–100.0)
Monocytes Absolute: 0.6 10*3/uL (ref 0.1–1.0)
Monocytes Relative: 7.7 % (ref 3.0–12.0)
Neutro Abs: 4.3 10*3/uL (ref 1.4–7.7)
Neutrophils Relative %: 58.9 % (ref 43.0–77.0)
Platelets: 282 10*3/uL (ref 150.0–400.0)
RBC: 4.36 Mil/uL (ref 3.87–5.11)
RDW: 12.8 % (ref 11.5–15.5)
WBC: 7.4 10*3/uL (ref 4.0–10.5)

## 2022-05-09 MED ORDER — BREZTRI AEROSPHERE 160-9-4.8 MCG/ACT IN AERO: 2.0000 | INHALATION_SPRAY | Freq: Two times a day (BID) | RESPIRATORY_TRACT | 0 refills | Status: DC

## 2022-05-09 NOTE — Patient Instructions (Signed)
Order - sample x 2 Breztri inhaler     inhale 2 puffs then rinse mouth, twice daily. Try this instead of Breo.  Order- new DME   new compressor nebulizer                                DuoNeb neb solution    360 ml    1 neb up to 4 times daily as needed, ref x 12    Order- lab- CBC w diff, IgE level       dx Asthma/ COPD overlap

## 2022-05-10 LAB — IGE: IgE (Immunoglobulin E), Serum: 1556 kU/L — ABNORMAL HIGH (ref <=114)

## 2022-05-11 NOTE — Progress Notes (Signed)
[name] patient. LVM regarding lab work.

## 2022-05-13 ENCOUNTER — Telehealth: Payer: Self-pay | Admitting: Pharmacist

## 2022-05-13 NOTE — Telephone Encounter (Addendum)
Submitted a Prior Authorization request to [employer] for [place] via CoverMyMeds. Will update once we receive a response.  Key: [id number]  Knox Saliva, PharmD, MPH, BCPS, CPP Clinical Pharmacist (Rheumatology and Pulmonology)  ----- Message from Miguel Dibble, [place] sent at 05/13/2022  9:42 AM EST ----- Dr. Annamaria Boots would like this patient to start [place], she has agreed. I will be mailing her the application today. Due to work she in unable to come in and fill it out.

## 2022-05-13 NOTE — Progress Notes (Signed)
Will start Dupixent BIV in new telephone encounter. Thanks!  Knox Saliva, PharmD, MPH, BCPS, CPP Clinical Pharmacist (Rheumatology and Pulmonology)

## 2022-05-20 ENCOUNTER — Other Ambulatory Visit (HOSPITAL_COMMUNITY): Payer: Self-pay

## 2022-05-20 NOTE — Telephone Encounter (Signed)
Enrolled pt in copay card program using in-office card.  RxBIN: [id number] RxPCN: [id number] RxGRP: [id number] ID: [id number]  Copy of card will be sent to scan center for retention.

## 2022-05-20 NOTE — Telephone Encounter (Signed)
Received notification from [place] regarding a prior authorization for [place]. Authorization has been APPROVED from 04/13/2022 through 11/09/2022 Case ID # [id number]  Another auth also approved (presumable for loading dose) from 04/13/22 to 06/10/22  Unable to run test claim because patient must fill through [place]: [phone]  Authorization # [id number]  Attempted to enroll pt into Dupixent copay card but she does not have email on file. [name] patient to help enroll but unable to reach. Left VM requesting return call. Can enroll using copay card in office if they are expected to be correct once activated  Knox Saliva, PharmD, MPH, BCPS, CPP Clinical Pharmacist (Rheumatology and Pulmonology)

## 2022-05-23 NOTE — Telephone Encounter (Signed)
[name] patient to schedule Dupixent new start. Unable to reach. Left VM requesting return call  Viggo Perko, PharmD, MPH, BCPS, CPP Clinical Pharmacist (Rheumatology and Pulmonology) 

## 2022-05-25 NOTE — Assessment & Plan Note (Addendum)
Mild persistent bronchitis. Plan-Labs for CBC with diff and IgE looking for allergy pattern.  Flu vaccine today.  Order nebulizer machine and DuoNeb.  Try samples of Breztri.

## 2022-05-25 NOTE — Assessment & Plan Note (Signed)
Identified as benign by stable appearance since 2012.

## 2022-05-26 NOTE — Telephone Encounter (Signed)
Patient scheduled for Dupixent new start on 06/01/2022.  Will use sample. Patient states some nervousness (was on Xolair but only in-office injections. She also states that she had worsening of cuogh while on Xolair)  Knox Saliva, PharmD, MPH, BCPS, CPP Clinical Pharmacist (Rheumatology and Pulmonology)

## 2022-05-30 ENCOUNTER — Encounter: Payer: Self-pay | Admitting: Internal Medicine

## 2022-06-01 ENCOUNTER — Other Ambulatory Visit: Payer: 59 | Admitting: Pharmacist

## 2022-06-02 NOTE — Telephone Encounter (Signed)
Patient cancelled [place] new start appt that was scheduled for 12/13/223. [name] patient to r/s. Unable to reach. Left VM requesting return call  Knox Saliva, PharmD, MPH, BCPS, CPP Clinical Pharmacist (Rheumatology and Pulmonology)

## 2022-06-06 NOTE — Telephone Encounter (Signed)
[name] #2 patient regarding Dupixent. Unable to reach. Left VM requesting return call  Patient cancelled [facility] new start appt that was scheduled for 12/13/223.   Knox Saliva, PharmD, MPH, BCPS, CPP Clinical Pharmacist (Rheumatology and Pulmonology)

## 2022-06-07 ENCOUNTER — Ambulatory Visit: Payer: 59 | Admitting: Internal Medicine

## 2022-06-14 NOTE — Telephone Encounter (Signed)
[name] #3 patient regarding Dupixent new start. Unable to reach. Unable to leave VM because VM box is full. Patient cancelled [place] new start appt that was scheduled for 06/01/22 and has failed to return calls since then. Letter sent today as patient has no MyChart.  If no follow-up from patient by 06/24/2022, pharmacy team will close encounter  Knox Saliva, PharmD, MPH, BCPS, CPP Clinical Pharmacist (Rheumatology and Pulmonology)

## 2022-06-24 NOTE — Telephone Encounter (Signed)
No f/u from patient. Closing encounter. Provider can let pharmacy team know if pt interested in starting.  Knox Saliva, PharmD, MPH, BCPS, CPP Clinical Pharmacist (Rheumatology and Pulmonology)

## 2022-07-11 ENCOUNTER — Ambulatory Visit: Payer: 59 | Admitting: Internal Medicine

## 2022-07-22 ENCOUNTER — Ambulatory Visit: Payer: 59 | Admitting: Internal Medicine

## 2022-08-03 NOTE — Progress Notes (Deleted)
HPI Female former smoker followed for asthma complicated by hypertension and history of 3 lung nodules followed by Dr. Cyndia Bent.   PFT 01/13/2003-FEV1 2.5 L/92%, FEV1/FVC 0.76 PFT 11/03/14-moderately severe obstructive airways disease with air trapping, normal diffusion capacity, slight response to bronchodilator. FEV1 1.58/64%, FEV1/FVC 0.59, RV 169%, DLCO 93%. Allergy labs 08/05/14- broadly positive mites, cat, dog, grass, tree, weed pollens Total IgE 243, elevated for many foods Eosinophils 6.8%  -----------------------------------------------------    05/09/22-  67 year old female former smoker(30 pkyrs) followed for asthma/COPD (quit Xolair-cost), allergic Rhinitis, complicated by HTN, history 3 lung nodules - saw Dr. Cyndia Bent, GERD, Glaucoma, Hiatal Hernia, Breast CA/ bil mastectomy, Palpitation,   -Proair hfa, Breo 200, Flonase, Astelin,   Covid vax-3 Phizer Flu vax-had Avoiding LAMAs due to glaucoma LOV Cobb, NP 04/21/22- Had AECOPD in June  Tried Trelegy- no better. No recent infection but she has trouble clearing her chest of congestion/wheeze/shortness of breath.  Cough is productive of thick white or sometimes light beige sputum.  No blood and no fever.  Feels controlled but does not clear. Having cardiac evaluation done and had echocardiogram. Coronary CT 05/06/22- 0 calcium CXR 04/10/22-  IMPRESSION: Stable chronic pulmonary interstitial prominence. No acute findings. CT chest 12/28/21- IMPRESSION: 1. Multiple nodules in the right lung with the largest nodule in the right lower lobe measuring 7 mm x 6 mm in comparison to 8 mm x 6 mm on the previous study. These nodules are stable going back to the study dated September 07, 2010. No new nodule seen. 2.  Moderate hiatal hernia. 3.  Severe lumbar spondylosis.  08/08/22-  67 year old female former smoker(30 pkyrs) followed for asthma/COPD (quit Xolair-cost), allergic Rhinitis, complicated by HTN, history 3 lung nodules - saw Dr. Cyndia Bent,  GERD, Glaucoma, Hiatal Hernia, Breast CA/ bil mastectomy, Palpitation, Osteopenia,   -Proair hfa, Breo 200, Flonase, Astelin,   Covid vax-3 Phizer Flu vax-had Avoiding LAMAs due to glaucoma She did not return calls from our pharmacist about starting [place].    ROS- see HPI + = positive Constitutional:   No-   weight loss, night sweats, fevers, chills, fatigue, lassitude. HEENT:    headaches, no-difficulty swallowing, tooth/dental problems, sore throat,     No-sneezing, no- itching, ear ache, nasal congestion, post nasal drip,  CV:  No-chest pain, orthopnea, PND, swelling in lower extremities, anasarca, dizziness, +palpitations Resp: +  shortness of breath with exertion or at rest.            productive cough,    non-productive cough,  No-  coughing up of blood.             No-change in color of mucus.    +wheezing.   Skin: No-   rash or lesions. GI:  No-  heartburn, indigestion, no-abdominal pain, no-nausea, vomiting,  GU:  MS:  No-   joint pain or swelling.   Neuro- nothing unusual Psych:  No- change in mood or affect. + depression or anxiety.  No memory loss.  OBJ General- Alert, Oriented, Affect-appropriate, Distress- none acute Skin- clear Lymphadenopathy- none Head- atraumatic            Eyes- Gross vision intact, PERRLA, conjunctivae clear secretions            Ears- Hearing, canals            Nose- Clear, no-Septal dev, mucus, polyps, erosion, perforation             Throat- Mallampati II , mucosa clear , drainage-none, tonsils-  atrophic.  Neck- flexible , trachea midline, no stridor , thyroid nl, carotid no bruit Chest - symmetrical excursion , unlabored           Heart/CV- RRR , no murmur , no gallop  , no rub, nl s1 s2                           - JVD- none , edema- none, stasis changes- none, varices- none           Lung-   +distant breath sounds , +coarse,  dullness-none, rub- none, wheezing-none,             cough-none           Chest wall-  Abd-  Br/ Gen/  Rectal- Not done, not indicated Extrem- cyanosis- none, clubbing, none, atrophy- none, strength- nl Neuro- grossly intact to observation

## 2022-08-05 ENCOUNTER — Ambulatory Visit: Payer: 59 | Admitting: Internal Medicine

## 2022-08-08 ENCOUNTER — Ambulatory Visit: Payer: 59 | Admitting: Internal Medicine

## 2022-08-29 NOTE — Progress Notes (Signed)
HPI Female former smoker followed for asthma complicated by hypertension and history of 3 lung nodules followed by Dr. Laneta Simmers.   PFT 01/13/2003-FEV1 2.5 L/92%, FEV1/FVC 0.76 PFT 11/03/14-moderately severe obstructive airways disease with air trapping, normal diffusion capacity, slight response to bronchodilator. FEV1 1.58/64%, FEV1/FVC 0.59, RV 169%, DLCO 93%. Allergy labs 08/05/14- broadly positive mites, cat, dog, grass, tree, weed pollens Total IgE 243, elevated for many foods Eosinophils 6.8%  -----------------------------------------------------    05/09/22-  67 year old female former smoker(30 pkyrs) followed for asthma/COPD (quit Xolair-cost), allergic Rhinitis, complicated by HTN, history 3 lung nodules - saw Dr. Laneta Simmers, GERD, Glaucoma, Hiatal Hernia, Breast CA/ bil mastectomy, Palpitation,   -Proair hfa, Breo 200, Flonase, Astelin,   Covid vax-3 Phizer Flu vax-had Avoiding LAMAs due to glaucoma LOV Cobb, NP 04/21/22- Had AECOPD in June  Tried Trelegy- no better. No recent infection but she has trouble clearing her chest of congestion/wheeze/shortness of breath.  Cough is productive of thick white or sometimes light beige sputum.  No blood and no fever.  Feels controlled but does not clear. Having cardiac evaluation done and had echocardiogram. Coronary CT 05/06/22- 0 calcium CXR 04/10/22-  IMPRESSION: Stable chronic pulmonary interstitial prominence. No acute findings. CT chest 12/28/21- IMPRESSION: 1. Multiple nodules in the right lung with the largest nodule in the right lower lobe measuring 7 mm x 6 mm in comparison to 8 mm x 6 mm on the previous study. These nodules are stable going back to the study dated September 07, 2010. No new nodule seen. 2.  Moderate hiatal hernia. 3.  Severe lumbar spondylosis.  08/30/22- 67 year old female former smoker(30 pkyrs) followed for asthma/COPD (quit Xolair-cost), allergic Rhinitis, complicated by HTN, history 3 lung nodules - saw Dr. Laneta Simmers,  GERD, Glaucoma, Hiatal Hernia, Breast CA/ bil mastectomy, Palpitation,   -Proair hfa, Breo 200, Flonase, Astelin,   Covid vax-3 Phizer Flu vax-had Avoiding LAMAs due to glaucoma We were going to start Dupixent but pt didn't return calls from [facility].as of Nov 24. -----Pt advises she has been well Changing insurance. She chose to wait on Dupixent but may reconsider with new insurance.  Mild nasal drainage.   ROS- see HPI + = positive Constitutional:   No-   weight loss, night sweats, fevers, chills, fatigue, lassitude. HEENT:    headaches, no-difficulty swallowing, tooth/dental problems, sore throat,     No-sneezing, no- itching, ear ache, nasal congestion, post nasal drip,  CV:  No-chest pain, orthopnea, PND, swelling in lower extremities, anasarca, dizziness, +palpitations Resp: +  shortness of breath with exertion or at rest.            productive cough,    non-productive cough,  No-  coughing up of blood.             No-change in color of mucus.    +wheezing.   Skin: No-   rash or lesions. GI:  No-  heartburn, indigestion, no-abdominal pain, no-nausea, vomiting,  GU:  MS:  No-   joint pain or swelling.   Neuro- nothing unusual Psych:  No- change in mood or affect. + depression or anxiety.  No memory loss.  OBJ General- Alert, Oriented, Affect-appropriate, Distress- none acute Skin- clear Lymphadenopathy- none Head- atraumatic            Eyes- Gross vision intact, PERRLA, conjunctivae clear secretions            Ears- Hearing, canals            Nose- Clear,  no-Septal dev, mucus, polyps, erosion, perforation             Throat- Mallampati II , mucosa clear , drainage-none, tonsils- atrophic.  Neck- flexible , trachea midline, no stridor , thyroid nl, carotid no bruit Chest - symmetrical excursion , unlabored           Heart/CV- RRR , no murmur , no gallop  , no rub, nl s1 s2                           - JVD- none , edema- none, stasis changes- none, varices- none            Lung-   +distant breath sounds , +coarse,  dullness-none, rub- none, wheezing-none,             Cough+ congested           Chest wall-  Abd-  Br/ Gen/ Rectal- Not done, not indicated Extrem- cyanosis- none, clubbing, none, atrophy- none, strength- nl Neuro- grossly intact to observation

## 2022-08-30 ENCOUNTER — Encounter: Payer: Self-pay | Admitting: Internal Medicine

## 2022-08-30 ENCOUNTER — Ambulatory Visit (INDEPENDENT_AMBULATORY_CARE_PROVIDER_SITE_OTHER): Payer: 59 | Admitting: Internal Medicine

## 2022-08-30 VITALS — BP 112/62 | HR 90 | Ht 62.0 in | Wt 146.8 lb

## 2022-08-30 DIAGNOSIS — J4489 Other specified chronic obstructive pulmonary disease: Secondary | ICD-10-CM | POA: Diagnosis not present

## 2022-08-30 DIAGNOSIS — K219 Gastro-esophageal reflux disease without esophagitis: Secondary | ICD-10-CM

## 2022-08-30 MED ORDER — PREDNISONE 10 MG PO TABS: ORAL_TABLET | ORAL | 0 refills | Status: DC

## 2022-08-30 NOTE — Patient Instructions (Signed)
Please call us back when your new insurance is established. We can then order you a nebulizer machine and medicine and Duoneb nebulizer solution through [employer]..  Script sent for prednisone taper to hold.  Please call as needed

## 2022-09-05 ENCOUNTER — Ambulatory Visit: Payer: 59 | Admitting: Internal Medicine

## 2022-10-04 NOTE — Assessment & Plan Note (Signed)
Inadequate control. I discussed Dupixent again. I think she would be a good candidate if willing and able. Plan- prednisone taper with prednisone talk. Check with [employer] on status of nebulizer order.

## 2022-10-04 NOTE — Assessment & Plan Note (Signed)
Emphasize reflux precautions to avoid aspiration.

## 2022-11-04 ENCOUNTER — Telehealth: Payer: Self-pay | Admitting: Internal Medicine

## 2022-11-04 NOTE — Telephone Encounter (Signed)
[facility] calling. They got an RX sent for Pred w/no directions. Please call or resend.  [place]/[facility] @ [phone]

## 2022-11-07 MED ORDER — AZITHROMYCIN 250 MG PO TABS: 250.0000 mg | ORAL_TABLET | Freq: Every day | ORAL | 0 refills | Status: DC

## 2022-11-07 MED ORDER — PREDNISONE 10 MG PO TABS: ORAL_TABLET | ORAL | 0 refills | Status: AC

## 2022-11-07 NOTE — Telephone Encounter (Signed)
Spoke with patient. Advised Prednisone and Antibiotic have been sent to pharmacy. NFN

## 2022-11-07 NOTE — Telephone Encounter (Signed)
Prednisone 10 mg, # 20, 4 X 2 DAYS, 3 X 2 DAYS, 2 X 2 DAYS, 1 X 2 DAYS  Zpak 250 mg, # 6  2 today then one daily

## 2022-11-07 NOTE — Telephone Encounter (Signed)
Pt. Calling again and need the script called in to pharm. She is still having a bad cough

## 2022-11-07 NOTE — Telephone Encounter (Signed)
Spoke with patient. She complains of productive cough with yellow phlegm, wheezing, sob, chest congestion. Symptoms present x1 week.  No directions on prednisone prescription- please update   Pharmacy [facility] in [place]  Dr.Young please advise

## 2022-12-06 ENCOUNTER — Telehealth: Payer: Self-pay | Admitting: Internal Medicine

## 2022-12-06 NOTE — Telephone Encounter (Signed)
Pt. Calling to see if dr. Maple Hudson can call in  more Prednisone she had taken 3 days of meds but the meds fell in dog water bowl please advise

## 2022-12-07 MED ORDER — PREDNISONE 10 MG PO TABS: ORAL_TABLET | ORAL | 0 refills | Status: DC

## 2022-12-07 NOTE — Telephone Encounter (Signed)
Patient checking on Prednisone message. Patient phone number is [phone].

## 2022-12-07 NOTE — Telephone Encounter (Signed)
[name] X1 LVM for patient. Please advise prednisone has been sent to pharmacy

## 2022-12-07 NOTE — Telephone Encounter (Signed)
Prdnisone 10 mg, # 20, 4 X 2 DAYS, 3 X 2 DAYS, 2 X 2 DAYS, 1 X 2 DAYS

## 2022-12-07 NOTE — Telephone Encounter (Signed)
Dr. Maple Hudson are you okay with me refilling Prednisone. Patient states she only had 3 days worth before dropping prescription in dogs water bowl

## 2022-12-08 NOTE — Telephone Encounter (Signed)
Notified pt prednisone had been called in. Nothing further needed at this time.

## 2023-01-19 ENCOUNTER — Ambulatory Visit: Payer: 59 | Attending: Internal Medicine | Admitting: Internal Medicine

## 2023-01-19 ENCOUNTER — Encounter: Payer: Self-pay | Admitting: Internal Medicine

## 2023-01-19 VITALS — BP 112/62 | HR 77 | Ht 62.0 in | Wt 146.0 lb

## 2023-01-19 DIAGNOSIS — K449 Diaphragmatic hernia without obstruction or gangrene: Secondary | ICD-10-CM

## 2023-01-19 DIAGNOSIS — R002 Palpitations: Secondary | ICD-10-CM | POA: Diagnosis not present

## 2023-01-19 DIAGNOSIS — J4489 Other specified chronic obstructive pulmonary disease: Secondary | ICD-10-CM

## 2023-01-19 DIAGNOSIS — K219 Gastro-esophageal reflux disease without esophagitis: Secondary | ICD-10-CM

## 2023-01-19 NOTE — Progress Notes (Signed)
Cardiology Office Note:    Date:  01/19/2023   ID:  Kelsey Duran, DOB Jul 09, 1955, MRN [id number]  PCP:  Kelsey Hale, MD   [facility] Providers Cardiologist:  Kelsey Constant, MD     Referring MD: Kelsey Duran, *   CC:  Palpitations f/u  History of Present Illness:    Kelsey Duran is a 67 y.o. female with a hx of HTN, Prior breast cancer, former smoker with lung nodules. With no vascular calcium who presents with chest pain. 2023: All testing performed came back WNL, save for aortic atherosclerosis.  Patient notes that she is doing well.   Since last visit notes that her palpitations have largely resolved. There are no interval hospital/ED visit.   Palpitatiosn have improved so much that she did not to heart monitor.  No chest pain or pressure .  No SOB/DOE and no PND/Orthopnea.  No weight gain or leg swelling.  No palpitations or syncope.  Still able to take care of her one year grandchild. Another on the way.  Past Medical History:  Diagnosis Date   ASTHMA 09/01/2009   HYPERTENSION 09/08/2009   LUNG NODULE 09/08/2009    Past Surgical History:  Procedure Laterality Date   ABDOMINAL HYSTERECTOMY     BREAST BIOPSY Left 09/01/2021   BUNIONECTOMY     TONSILLECTOMY      Current Medications: Current Meds  Medication Sig   acetaminophen (TYLENOL) 500 MG tablet Take by mouth as needed.   albuterol (PROAIR HFA) 108 (90 Base) MCG/ACT inhaler Inhale 2 puffs every 6 hours as needed   ALPRAZolam (XANAX) 0.5 MG tablet Take 1 tablet (0.5 mg total) by mouth 2 (two) times daily as needed.   anastrozole (ARIMIDEX) 1 MG tablet Take 1 mg by mouth daily.   azelastine (ASTELIN) 0.1 % nasal spray USE 1 TO 2 SPRAYS IN EACH NOSTRIL ONCE DAILY AT BEDTIME   calcium carbonate (OS-CAL) 600 MG TABS tablet Take 600 mg by mouth 2 (two) times daily with a meal.   clobetasol (TEMOVATE) 0.05 % cream Apply topically 2 (two) times daily. (Patient taking differently:  Apply topically as needed (rash).)   famotidine (PEPCID) 20 MG tablet Take by mouth as needed for heartburn.   fluticasone (FLONASE) 50 MCG/ACT nasal spray 1 puff each nostril twice daiy   fluticasone furoate-vilanterol (BREO ELLIPTA) 200-25 MCG/ACT AEPB INHALE 1 PUFF INTO THE LUNGS DAILY   latanoprost (XALATAN) 0.005 % ophthalmic solution    magnesium oxide (MAG-OX) 400 MG tablet Take 400 mg by mouth daily.   Multiple Vitamin (MULTIVITAMIN) tablet Take 1 tablet by mouth daily.   olmesartan-hydrochlorothiazide (BENICAR HCT) 20-12.5 MG tablet    Omega-3 Fatty Acids (FISH OIL) 1000 MG CPDR Take 1 capsule by mouth daily.   traZODone (DESYREL) 50 MG tablet Take 100 mg by mouth at bedtime as needed.   triamcinolone cream (KENALOG) 0.1 % Apply 1 Application topically as needed for rash.   Current Facility-Administered Medications for the 01/19/23 encounter (Office Visit) with Kelsey Constant, MD  Medication   ipratropium-albuterol (DUONEB) 0.5-2.5 (3) MG/3ML nebulizer solution 3 mL     Allergies:   Pistachio nut extract, Biaxin [clarithromycin], Codeine, Penicillins, Tetracycline, and Valsartan   Social History   Socioeconomic History   Marital status: Married    Spouse name: Not on file   Number of children: 1   Years of education: Not on file   Highest education level: Not on file  Occupational History  Employer: Probation officer  Tobacco Use   Smoking status: Former    Current packs/day: 0.00    Average packs/day: 2.0 packs/day for 15.0 years (30.0 ttl pk-yrs)    Types: Cigarettes    Start date: 06/20/1968    Quit date: 06/21/1983    Years since quitting: 39.6   Smokeless tobacco: Never  Vaping Use   Vaping status: Never Used  Substance and Sexual Activity   Alcohol use: Yes   Drug use: Yes   Sexual activity: Not on file  Other Topics Concern   Not on file  Social History Narrative   Not on file   Social Determinants of Health   Financial Resource Strain:  Unknown (11/26/2020)   Received from [facility], [facility]   Overall Financial Resource Strain (CARDIA)    Difficulty of Paying Living Expenses: Patient declined  Food Insecurity: No Food Insecurity (12/07/2020)   Received from [facility], [facility]   Hunger Vital Sign    Worried About Running Out of Food in the Last Year: Never true    Ran Out of Food in the Last Year: Never true  Transportation Needs: Not on file  Physical Activity: Not on file  Stress: Unknown (11/26/2020)   Received from [facility], [facility]   [employer] [facility] - Occupational Stress Questionnaire    Feeling of Stress : Patient declined  Social Connections: Unknown (10/31/2021)   Received from [facility], [facility]   Social Network    Social Network: Not on file     Family History: The patient's family history includes Heart attack in her mother; Pancreatitis in her father; Stroke in her sister.  ROS:   Please see the history of present illness.     EKGs/Labs/Other Studies Reviewed:    The following studies were reviewed today:  Cardiac Studies & Procedures       ECHOCARDIOGRAM  ECHOCARDIOGRAM COMPLETE 04/21/2022  Narrative ECHOCARDIOGRAM REPORT    Patient Name:   Kelsey Duran Date of Exam: 04/21/2022 Medical Rec #:  [id number]      Height:       62.0 in Accession #:    [id number]     Weight:       146.8 lb Date of Birth:  Oct 05, 1955       BSA:          1.676 m Patient Age:    66 years       BP:           130/82 mmHg Patient Gender: F              HR:           64 bpm. Exam Location:  [address]  Procedure: 2D Echo, Cardiac Doppler, Color Doppler and Strain Analysis  Indications:    R07.2 Chest Pain  History:        Patient has prior history of Echocardiogram examinations, most recent 04/27/2012. Prior breast cancer, Signs/Symptoms:Palpitations; Risk Factors:Hypertension and Former Smoker.  Sonographer:    Clearence Ped RCS Referring Phys:  [id number] Kelsey Duran  IMPRESSIONS   1. Left ventricular ejection fraction, by estimation, is 55 to 60%. The left ventricle has normal function. The left ventricle has no regional wall motion abnormalities. Left ventricular diastolic parameters were normal. 2. Right ventricular systolic function is normal. The right ventricular size is normal. There is normal pulmonary artery systolic pressure. 3. Left atrial size was mildly dilated. 4. Right atrial size  was mildly dilated. 5. The mitral valve is normal in structure. No evidence of mitral valve regurgitation. No evidence of mitral stenosis. 6. The aortic valve is tricuspid. There is mild calcification of the aortic valve. Aortic valve regurgitation is not visualized. Aortic valve sclerosis/calcification is present, without any evidence of aortic stenosis. 7. The inferior vena cava is normal in size with greater than 50% respiratory variability, suggesting right atrial pressure of 3 mmHg.  FINDINGS Left Ventricle: Left ventricular ejection fraction, by estimation, is 55 to 60%. The left ventricle has normal function. The left ventricle has no regional wall motion abnormalities. The left ventricular internal cavity size was normal in size. There is no left ventricular hypertrophy. Left ventricular diastolic parameters were normal.  Right Ventricle: The right ventricular size is normal. No increase in right ventricular wall thickness. Right ventricular systolic function is normal. There is normal pulmonary artery systolic pressure. The tricuspid regurgitant velocity is 1.91 m/s, and with an assumed right atrial pressure of 3 mmHg, the estimated right ventricular systolic pressure is 17.6 mmHg.  Left Atrium: Left atrial size was mildly dilated.  Right Atrium: Right atrial size was mildly dilated.  Pericardium: There is no evidence of pericardial effusion.  Mitral Valve: The mitral valve is normal in structure. No evidence of mitral  valve regurgitation. No evidence of mitral valve stenosis.  Tricuspid Valve: The tricuspid valve is normal in structure. Tricuspid valve regurgitation is mild . No evidence of tricuspid stenosis.  Aortic Valve: The aortic valve is tricuspid. There is mild calcification of the aortic valve. Aortic valve regurgitation is not visualized. Aortic valve sclerosis/calcification is present, without any evidence of aortic stenosis.  Pulmonic Valve: The pulmonic valve was normal in structure. Pulmonic valve regurgitation is not visualized. No evidence of pulmonic stenosis.  Aorta: The aortic root is normal in size and structure.  Venous: The inferior vena cava is normal in size with greater than 50% respiratory variability, suggesting right atrial pressure of 3 mmHg.  IAS/Shunts: No atrial level shunt detected by color flow Doppler.   LEFT VENTRICLE PLAX 2D LVIDd:         2.70 cm   Diastology LVIDs:         2.00 cm   LV e' medial:    7.94 cm/s LV PW:         0.80 cm   LV E/e' medial:  7.9 LV IVS:        0.60 cm   LV e' lateral:   8.05 cm/s LVOT diam:     2.10 cm   LV E/e' lateral: 7.8 LV SV:         82 LV SV Index:   49 LVOT Area:     3.46 cm   RIGHT VENTRICLE RV Basal diam:  3.80 cm RV S prime:     15.00 cm/s TAPSE (M-mode): 1.7 cm RVSP:           17.6 mmHg  LEFT ATRIUM             Index        RIGHT ATRIUM           Index LA diam:        3.10 cm 1.85 cm/m   RA Pressure: 3.00 mmHg LA Vol (A2C):   34.6 ml 20.64 ml/m  RA Area:     13.30 cm LA Vol (A4C):   27.9 ml 16.64 ml/m  RA Volume:   30.10 ml  17.96 ml/m LA Biplane  Vol: 33.8 ml 20.16 ml/m AORTIC VALVE LVOT Vmax:   115.00 cm/s LVOT Vmean:  71.800 cm/s LVOT VTI:    0.236 m  AORTA Ao Root diam: 3.00 cm Ao Asc diam:  3.50 cm  MITRAL VALVE               TRICUSPID VALVE MV Area (PHT):             TR Peak grad:   14.6 mmHg MV Decel Time:             TR Vmax:        191.00 cm/s MV E velocity: 62.90 cm/s  Estimated RAP:  3.00  mmHg MV A velocity: 63.40 cm/s  RVSP:           17.6 mmHg MV E/A ratio:  0.99 SHUNTS Systemic VTI:  0.24 m Systemic Diam: 2.10 cm  Arvilla Meres MD Electronically signed by Arvilla Meres MD Signature Date/Time: 04/21/2022/5:22:30 PM    Final     CT SCANS  CT CORONARY MORPH W/CTA COR W/SCORE 05/06/2022  Addendum 05/09/2022  8:21 AM ADDENDUM REPORT: 05/09/2022 08:18  EXAM: OVER-READ INTERPRETATION  CT CHEST  The following report is a limited chest CT over-read performed by radiologist Dr. Jeronimo Greaves of [facility] on 05/06/2022. This over-read does not include interpretation of cardiac or coronary anatomy or pathology. The coronary CTA interpretation by the cardiologist is attached.  COMPARISON:  12/28/2021 chest CT  FINDINGS: Vascular: Aortic atherosclerosis. No central pulmonary embolism, on this non-dedicated study.  Mediastinum/Nodes: No imaged thoracic adenopathy. Moderate hiatal hernia.  Lungs/Pleura: No pleural fluid. Lingular volume loss and scarring. Bilateral lower lobe pulmonary nodules. The largest measures 6 mm in the right lower lobe on 23/11 and is similar back to 09/07/2010, consistent with a benign etiology.  Upper Abdomen: Normal imaged portions of the liver, spleen.  Musculoskeletal: Convex right thoracic spine curvature.  IMPRESSION: 1. No acute findings in the imaged extracardiac chest. 2. Moderate hiatal hernia. 3. Bilateral lower lobe pulmonary nodules, similar back to 09/07/2010, consistent with a benign etiology. 4.  Aortic Atherosclerosis (ICD10-I70.0).   Electronically Signed By: Jeronimo Greaves M.D. On: 05/09/2022 08:18  Narrative CLINICAL DATA:  Chest pain  EXAM: Cardiac/Coronary CTA  TECHNIQUE: A non-contrast, gated CT scan was obtained with axial slices of 3 mm through the heart for calcium scoring. Calcium scoring was performed using the Agatston method. A 120 kV prospective, gated, contrast cardiac  scan was obtained. Gantry rotation speed was 250 msecs and collimation was 0.6 mm. Two sublingual nitroglycerin tablets (0.8 mg) were given. The 3D data set was reconstructed in 5% intervals of the 35-75% of the R-R cycle. Diastolic phases were analyzed on a dedicated workstation using MPR, MIP, and VRT modes. The patient received 95 cc of contrast.  FINDINGS: Image quality: Excellent.  Noise artifact is: Limited.  Coronary Arteries:  Normal coronary origin.  Right dominance.  Left main: The left main is a large caliber vessel with a normal take off from the left coronary cusp that bifurcates to form a left anterior descending artery and a left circumflex artery. There is no plaque or stenosis.  Left anterior descending artery: The LAD is patent without evidence of plaque or stenosis. The LAD gives off 2 patent diagonal branches.  Left circumflex artery: The LCX is non-dominant and patent with no evidence of plaque or stenosis. The LCX gives off a large branching patent obtuse marginal.  Right coronary artery: The RCA is dominant  with normal take off from the right coronary cusp. There is no evidence of plaque or stenosis. The RCA terminates as a PDA and right posterolateral branch without evidence of plaque or stenosis.  Right Atrium: Right atrial size is within normal limits.  Right Ventricle: The right ventricular cavity is within normal limits.  Left Atrium: Left atrial size is normal in size with no left atrial appendage filling defect.  Left Ventricle: The ventricular cavity size is within normal limits. There are no stigmata of prior infarction. There is no abnormal filling defect.  Pulmonary arteries: Normal in size without proximal filling defect.  Pulmonary veins: Normal pulmonary venous drainage.  Pericardium: Normal thickness with no significant effusion or calcium present.  Cardiac valves: The aortic valve is trileaflet without  significant calcification. The mitral valve is normal structure without significant calcification.  Aorta: Normal caliber with no significant disease.  Extra-cardiac findings: See attached radiology report for non-cardiac structures.  IMPRESSION: 1. Coronary calcium score of 0. This was 0 percentile for age-, sex, and race-matched controls.  2.  Normal coronary origin with right dominance.  3.  Normal coronary arteries.  CAD RADS 0.  4.  Consider non atherosclerotic causes of chest pain.  RECOMMENDATIONS: 1. CAD-RADS 0: No evidence of CAD (0%). Consider non-atherosclerotic causes of chest pain.  2. CAD-RADS 1: Minimal non-obstructive CAD (0-24%). Consider non-atherosclerotic causes of chest pain. Consider preventive therapy and risk factor modification.  3. CAD-RADS 2: Mild non-obstructive CAD (25-49%). Consider non-atherosclerotic causes of chest pain. Consider preventive therapy and risk factor modification.  4. CAD-RADS 3: Moderate stenosis. Consider symptom-guided anti-ischemic pharmacotherapy as well as risk factor modification per guideline directed care. Additional analysis with CT FFR will be submitted.  5. CAD-RADS 4: Severe stenosis. (70-99% or > 50% left main). Cardiac catheterization or CT FFR is recommended. Consider symptom-guided anti-ischemic pharmacotherapy as well as risk factor modification per guideline directed care. Invasive coronary angiography recommended with revascularization per published guideline statements.  6. CAD-RADS 5: Total coronary occlusion (100%). Consider cardiac catheterization or viability assessment. Consider symptom-guided anti-ischemic pharmacotherapy as well as risk factor modification per guideline directed care.  7. CAD-RADS N: Non-diagnostic study. Obstructive CAD can't be excluded. Alternative evaluation is recommended.  Armanda Magic, MD  Electronically Signed: By: Armanda Magic M.D. On: 05/06/2022 17:28            Recent Labs: 04/04/2022: BUN 18; Creatinine, Ser 0.87; Potassium 4.4; Sodium 135 05/09/2022: Hemoglobin 14.7; Platelets 282.0  Recent Lipid Panel No results found for: "CHOL", "TRIG", "HDL", "CHOLHDL", "VLDL", "LDLCALC", "LDLDIRECT"    Physical Exam:    VS:  BP 112/62   Pulse 77   Ht 5\' 2"  (1.575 m)   Wt 146 lb (66.2 kg)   SpO2 94%   BMI 26.70 kg/m     Wt Readings from Last 3 Encounters:  01/19/23 146 lb (66.2 kg)  08/30/22 146 lb 12.8 oz (66.6 kg)  05/09/22 148 lb (67.1 kg)    GEN:  Well nourished, well developed in no acute distress HEENT: Normal NECK: No JVD CARDIAC: RRR, no murmurs, rubs, gallops RESPIRATORY:  Rare inspiratory wheeze, normal rate and effort  ABDOMEN: Soft, non-tender, non-distended MUSCULOSKELETAL:  No edema; No deformity  SKIN: Warm and dry NEUROLOGIC:  Alert and oriented x 3 PSYCHIATRIC:  Normal affect   ASSESSMENT:    1. Palpitations   2. Asthma-COPD overlap syndrome   3. Gastroesophageal reflux disease without esophagitis   4. Hiatal hernia     PLAN:  Palpitations - mild now; if worsening we will get ziopatch  GERD Hiatal hernia - improved, discussed lifestyle changes, with improving sx CP has resolved   Aortic atherosclerosis - minimal; non seen on my review of her CT - will get Lipids and LpA at September visit, if elevated will set aggressive cholesterol goal  COPD - stable       Offered PRN f/u; will do one year per patient request    Medication Adjustments/Labs and Tests Ordered: Current medicines are reviewed at length with the patient today.  Concerns regarding medicines are outlined above.  No orders of the defined types were placed in this encounter.  No orders of the defined types were placed in this encounter.   Patient Instructions  Medication Instructions:  Your physician recommends that you continue on your current medications as directed. Please refer to the Current Medication list given to you  today.  *If you need a refill on your cardiac medications before your next appointment, please call your pharmacy*   Lab Work: NONE- - Please ask your Primary Care Provider to fax a copy of lab results to Dr. Izora Ribas  If you have labs (blood work) drawn today and your tests are completely normal, you will receive your results only by: MyChart Message (if you have MyChart) OR A paper copy in the mail If you have any lab test that is abnormal or we need to change your treatment, we will call you to review the results.   Testing/Procedures: NONE   Follow-Up: At [facility], you and your health needs are our priority.  As part of our continuing mission to provide you with exceptional heart care, we have created designated Provider Care Teams.  These Care Teams include your primary Cardiologist (physician) and Advanced Practice Providers (APPs -  Physician Assistants and Nurse Practitioners) who all work together to provide you with the care you need, when you need it.  We recommend signing up for the patient portal called "MyChart".  Sign up information is provided on this After Visit Summary.  MyChart is used to connect with patients for Virtual Visits (Telemedicine).  Patients are able to view lab/test results, encounter notes, upcoming appointments, etc.  Non-urgent messages can be sent to your provider as well.   To learn more about what you can do with MyChart, go to [email or web address].    Your next appointment:   1 year(s)  Provider:   Riley Lam, MD       Signed, Kelsey Constant, MD  01/19/2023 3:33 PM    [facility]

## 2023-01-19 NOTE — Patient Instructions (Signed)
Medication Instructions:  Your physician recommends that you continue on your current medications as directed. Please refer to the Current Medication list given to you today.  *If you need a refill on your cardiac medications before your next appointment, please call your pharmacy*   Lab Work: NONE- - Please ask your Primary Care Provider to fax a copy of lab results to Dr. Izora Ribas  If you have labs (blood work) drawn today and your tests are completely normal, you will receive your results only by: MyChart Message (if you have MyChart) OR A paper copy in the mail If you have any lab test that is abnormal or we need to change your treatment, we will call you to review the results.   Testing/Procedures: NONE   Follow-Up: At [facility], you and your health needs are our priority.  As part of our continuing mission to provide you with exceptional heart care, we have created designated Provider Care Teams.  These Care Teams include your primary Cardiologist (physician) and Advanced Practice Providers (APPs -  Physician Assistants and Nurse Practitioners) who all work together to provide you with the care you need, when you need it.  We recommend signing up for the patient portal called "MyChart".  Sign up information is provided on this After Visit Summary.  MyChart is used to connect with patients for Virtual Visits (Telemedicine).  Patients are able to view lab/test results, encounter notes, upcoming appointments, etc.  Non-urgent messages can be sent to your provider as well.   To learn more about what you can do with MyChart, go to [email or web address].    Your next appointment:   1 year(s)  Provider:   Riley Lam, MD

## 2023-02-17 ENCOUNTER — Other Ambulatory Visit: Payer: Self-pay | Admitting: Internal Medicine

## 2023-02-27 DIAGNOSIS — R7301 Impaired fasting glucose: Secondary | ICD-10-CM | POA: Diagnosis not present

## 2023-02-27 DIAGNOSIS — M810 Age-related osteoporosis without current pathological fracture: Secondary | ICD-10-CM | POA: Diagnosis not present

## 2023-02-27 DIAGNOSIS — E7800 Pure hypercholesterolemia, unspecified: Secondary | ICD-10-CM | POA: Diagnosis not present

## 2023-02-27 DIAGNOSIS — I10 Essential (primary) hypertension: Secondary | ICD-10-CM | POA: Diagnosis not present

## 2023-03-01 NOTE — Progress Notes (Signed)
HPI Female former smoker followed for asthma complicated by hypertension and history of 3 lung nodules followed by Dr. Laneta Simmers.   PFT 01/13/2003-FEV1 2.5 L/92%, FEV1/FVC 0.76 PFT 11/03/14-moderately severe obstructive airways disease with air trapping, normal diffusion capacity, slight response to bronchodilator. FEV1 1.58/64%, FEV1/FVC 0.59, RV 169%, DLCO 93%. Allergy labs 08/05/14- broadly positive mites, cat, dog, grass, tree, weed pollens Total IgE 243, elevated for many foods Eosinophils 6.8%  -----------------------------------------------------   08/30/22- 67 year old female former smoker(30 pkyrs) followed for asthma/COPD (quit Xolair-cost), allergic Rhinitis, complicated by HTN, history 3 lung nodules - saw Dr. Laneta Simmers, GERD, Glaucoma, Hiatal Hernia, Breast CA/ bil mastectomy, Palpitation,   -Proair hfa, Breo 200, Flonase, Astelin,   Covid vax-3 Phizer Flu vax-had Avoiding LAMAs due to glaucoma We were going to start Dupixent but pt didn't return calls from [facility]as of Nov 24. -----Pt advises she has been well Changing insurance. She chose to wait on Dupixent but may reconsider with new insurance.  Mild nasal drainage.   03/02/23- 67 year old female former smoker(30 pkyrs) followed for asthma/COPD (quit Xolair-cost), allergic Rhinitis, complicated by HTN, history 3 lung nodules/stable since 2012 - saw Dr. Laneta Simmers, GERD, Glaucoma, Hiatal Hernia, Breast CA/ bil mastectomy, Palpitation,   -Proair hfa, Breo 200, Flonase, Astelin,   Avoiding LAMAs due to glaucoma Lab 05/09/22- EOS 7.5 H, IgE 1,556 H She chose not to start Dupixent. -----Increased SOB, wheezing and cough with yellow sputum over the past wk.  Light cold caught from toddler grandchild. Daughter has same illness and tested neg for flu and covid, so patient did not test. She feels she can clear this on her own- no fever or purulent.  Inhalers controlling wheeze.  ROIncreased SOB, wheezing and cough with yellow sputum over  the past wk. S- see HPI + = positive Constitutional:   No-   weight loss, night sweats, fevers, chills, fatigue, lassitude. HEENT:    headaches, no-difficulty swallowing, tooth/dental problems, sore throat,     No-sneezing, no- itching, ear ache, nasal congestion, post nasal drip,  CV:  No-chest pain, orthopnea, PND, swelling in lower extremities, anasarca, dizziness, +palpitations Resp: +  shortness of breath with exertion or at rest.            productive cough,    non-productive cough,  No-  coughing up of blood.             No-change in color of mucus.    +wheezing.   Skin: No-   rash or lesions. GI:  No-  heartburn, indigestion, no-abdominal pain, no-nausea, vomiting,  GU:  MS:  No-   joint pain or swelling.   Neuro- nothing unusual Psych:  No- change in mood or affect. + depression or anxiety.  No memory loss.  OBJ General- Alert, Oriented, Affect-appropriate, Distress- none acute Skin- clear Lymphadenopathy- none Head- atraumatic            Eyes- Gross vision intact, PERRLA, conjunctivae clear secretions            Ears- Hearing, canals            Nose- Clear, no-Septal dev, mucus, polyps, erosion, perforation             Throat- Mallampati II , mucosa clear , drainage-none, tonsils- atrophic.  Neck- flexible , trachea midline, no stridor , thyroid nl, carotid no bruit Chest - symmetrical excursion , unlabored           Heart/CV- RRR , no murmur , no gallop  ,  no rub, nl s1 s2                           - JVD- none , edema- none, stasis changes- none, varices- none           Lung-   +distant breath sounds , +coarse,  dullness-none, rub- none, wheezing-none, Cough+ light           Chest wall-  Abd-  Br/ Gen/ Rectal- Not done, not indicated Extrem- cyanosis- none, clubbing, none, atrophy- none, strength- nl Neuro- grossly intact to observation

## 2023-03-02 ENCOUNTER — Ambulatory Visit (INDEPENDENT_AMBULATORY_CARE_PROVIDER_SITE_OTHER): Payer: 59 | Admitting: Internal Medicine

## 2023-03-02 ENCOUNTER — Encounter: Payer: Self-pay | Admitting: Internal Medicine

## 2023-03-02 VITALS — BP 118/70 | HR 68 | Temp 98.1°F | Ht 62.0 in | Wt 146.0 lb

## 2023-03-02 DIAGNOSIS — R918 Other nonspecific abnormal finding of lung field: Secondary | ICD-10-CM

## 2023-03-02 DIAGNOSIS — J4489 Other specified chronic obstructive pulmonary disease: Secondary | ICD-10-CM

## 2023-03-02 NOTE — Patient Instructions (Signed)
Hope you feel better quickly- call us if you aren't getting better over the weekend and we will try to help

## 2023-03-06 ENCOUNTER — Encounter: Payer: Self-pay | Admitting: Internal Medicine

## 2023-03-06 DIAGNOSIS — D225 Melanocytic nevi of trunk: Secondary | ICD-10-CM | POA: Diagnosis not present

## 2023-03-06 DIAGNOSIS — L300 Nummular dermatitis: Secondary | ICD-10-CM | POA: Diagnosis not present

## 2023-03-06 DIAGNOSIS — D1801 Hemangioma of skin and subcutaneous tissue: Secondary | ICD-10-CM | POA: Diagnosis not present

## 2023-03-06 DIAGNOSIS — D2271 Melanocytic nevi of right lower limb, including hip: Secondary | ICD-10-CM | POA: Diagnosis not present

## 2023-03-06 DIAGNOSIS — L738 Other specified follicular disorders: Secondary | ICD-10-CM | POA: Diagnosis not present

## 2023-03-06 DIAGNOSIS — L821 Other seborrheic keratosis: Secondary | ICD-10-CM | POA: Diagnosis not present

## 2023-03-06 NOTE — Assessment & Plan Note (Signed)
Severe persistent. Minor coryza now Plan- fluids, symptom meds, continue inhalers

## 2023-03-06 NOTE — Assessment & Plan Note (Signed)
Benign on long-term f/u Plan- occasional CXR

## 2023-03-24 DIAGNOSIS — H52203 Unspecified astigmatism, bilateral: Secondary | ICD-10-CM | POA: Diagnosis not present

## 2023-03-24 DIAGNOSIS — H524 Presbyopia: Secondary | ICD-10-CM | POA: Diagnosis not present

## 2023-03-24 DIAGNOSIS — H5203 Hypermetropia, bilateral: Secondary | ICD-10-CM | POA: Diagnosis not present

## 2023-03-24 DIAGNOSIS — H04123 Dry eye syndrome of bilateral lacrimal glands: Secondary | ICD-10-CM | POA: Diagnosis not present

## 2023-03-24 DIAGNOSIS — H25012 Cortical age-related cataract, left eye: Secondary | ICD-10-CM | POA: Diagnosis not present

## 2023-03-24 DIAGNOSIS — H2512 Age-related nuclear cataract, left eye: Secondary | ICD-10-CM | POA: Diagnosis not present

## 2023-03-24 DIAGNOSIS — H0100A Unspecified blepharitis right eye, upper and lower eyelids: Secondary | ICD-10-CM | POA: Diagnosis not present

## 2023-03-24 DIAGNOSIS — H01004 Unspecified blepharitis left upper eyelid: Secondary | ICD-10-CM | POA: Diagnosis not present

## 2023-03-24 DIAGNOSIS — H401231 Low-tension glaucoma, bilateral, mild stage: Secondary | ICD-10-CM | POA: Diagnosis not present

## 2023-04-05 ENCOUNTER — Telehealth: Payer: Self-pay | Admitting: Internal Medicine

## 2023-04-05 NOTE — Telephone Encounter (Signed)
Pt calling requesting Prednisone And Antibiotics [id number], [place] - [address]

## 2023-04-05 NOTE — Telephone Encounter (Signed)
Pt calling back to check on her refill

## 2023-04-07 ENCOUNTER — Other Ambulatory Visit: Payer: Self-pay

## 2023-04-07 MED ORDER — ALBUTEROL SULFATE HFA 108 (90 BASE) MCG/ACT IN AERS
INHALATION_SPRAY | RESPIRATORY_TRACT | 3 refills | Status: AC
Start: 2023-04-07 — End: ?

## 2023-04-07 MED ORDER — ALBUTEROL SULFATE HFA 108 (90 BASE) MCG/ACT IN AERS: INHALATION_SPRAY | RESPIRATORY_TRACT | 3 refills | Status: DC

## 2023-04-07 MED ORDER — AZITHROMYCIN 250 MG PO TABS: ORAL_TABLET | ORAL | 0 refills | Status: DC

## 2023-04-07 MED ORDER — PREDNISONE 10 MG PO TABS: ORAL_TABLET | ORAL | 0 refills | Status: DC

## 2023-04-07 NOTE — Telephone Encounter (Signed)
Prednisone and Zpak antibiotic sent to [facility]. Albuterol refill sent to mail order.

## 2023-04-07 NOTE — Telephone Encounter (Signed)
Spoke with patient regarding prior message. Patient is requesting medication for prednisone and antibiotics sent into her pharmacy .   Patient stated she has been sick started last weekend with yellow mucus 0 fever .   Dr.Young can you you please advise .

## 2023-04-07 NOTE — Telephone Encounter (Signed)
Pt calling requesting Prednisone And Antibiotics  [id number] - [place], [place] - [address]  Needs a 3 month supply for al[facility]ol [facility]

## 2023-04-07 NOTE — Telephone Encounter (Signed)
Spoke with patient requesting Prednisone and antibiotics sent into her pharmacy.   Patient stated she has not been feeling good started to feel sick last week .0 fever and yellow mucus.   Dr.Young can you please advise .   Thank you

## 2023-04-10 ENCOUNTER — Telehealth: Payer: Self-pay | Admitting: Internal Medicine

## 2023-04-10 MED ORDER — AZITHROMYCIN 250 MG PO TABS: ORAL_TABLET | ORAL | 0 refills | Status: DC

## 2023-04-25 DIAGNOSIS — C50919 Malignant neoplasm of unspecified site of unspecified female breast: Secondary | ICD-10-CM | POA: Diagnosis not present

## 2023-05-31 NOTE — Telephone Encounter (Signed)
Refill sent.

## 2023-07-18 DIAGNOSIS — G4700 Insomnia, unspecified: Secondary | ICD-10-CM | POA: Diagnosis not present

## 2023-07-18 DIAGNOSIS — R194 Change in bowel habit: Secondary | ICD-10-CM | POA: Diagnosis not present

## 2023-07-18 DIAGNOSIS — Z1211 Encounter for screening for malignant neoplasm of colon: Secondary | ICD-10-CM | POA: Diagnosis not present

## 2023-07-18 DIAGNOSIS — K625 Hemorrhage of anus and rectum: Secondary | ICD-10-CM | POA: Diagnosis not present

## 2023-07-18 DIAGNOSIS — J45998 Other asthma: Secondary | ICD-10-CM | POA: Diagnosis not present

## 2023-07-21 ENCOUNTER — Telehealth: Payer: Self-pay | Admitting: Internal Medicine

## 2023-07-21 DIAGNOSIS — C50919 Malignant neoplasm of unspecified site of unspecified female breast: Secondary | ICD-10-CM | POA: Diagnosis not present

## 2023-07-21 MED ORDER — FLUTICASONE FUROATE-VILANTEROL 200-25 MCG/ACT IN AEPB: 1.0000 | INHALATION_SPRAY | Freq: Every day | RESPIRATORY_TRACT | 3 refills | Status: DC

## 2023-07-21 NOTE — Telephone Encounter (Signed)
 I have sent in the refill and notified the patient.  Nothing further needed.

## 2023-07-21 NOTE — Telephone Encounter (Signed)
Patient needs Breo-200 inhaler refilled (3 months supply)  Pharmacy: [facility]

## 2023-07-31 DIAGNOSIS — K5730 Diverticulosis of large intestine without perforation or abscess without bleeding: Secondary | ICD-10-CM | POA: Diagnosis not present

## 2023-07-31 DIAGNOSIS — D128 Benign neoplasm of rectum: Secondary | ICD-10-CM | POA: Diagnosis not present

## 2023-07-31 DIAGNOSIS — Z1211 Encounter for screening for malignant neoplasm of colon: Secondary | ICD-10-CM | POA: Diagnosis not present

## 2023-07-31 DIAGNOSIS — D125 Benign neoplasm of sigmoid colon: Secondary | ICD-10-CM | POA: Diagnosis not present

## 2023-07-31 DIAGNOSIS — K635 Polyp of colon: Secondary | ICD-10-CM | POA: Diagnosis not present

## 2023-07-31 DIAGNOSIS — K625 Hemorrhage of anus and rectum: Secondary | ICD-10-CM | POA: Diagnosis not present

## 2023-07-31 DIAGNOSIS — K621 Rectal polyp: Secondary | ICD-10-CM | POA: Diagnosis not present

## 2023-07-31 DIAGNOSIS — Z860102 Personal history of hyperplastic colon polyps: Secondary | ICD-10-CM | POA: Diagnosis not present

## 2023-08-28 DIAGNOSIS — R1012 Left upper quadrant pain: Secondary | ICD-10-CM | POA: Diagnosis not present

## 2023-08-28 DIAGNOSIS — M25551 Pain in right hip: Secondary | ICD-10-CM | POA: Diagnosis not present

## 2023-08-28 DIAGNOSIS — I10 Essential (primary) hypertension: Secondary | ICD-10-CM | POA: Diagnosis not present

## 2023-08-30 NOTE — Progress Notes (Deleted)
 HPI Female former smoker followed for asthma complicated by hypertension and history of 3 lung nodules followed by Dr. Laneta Simmers.   PFT 01/13/2003-FEV1 2.5 L/92%, FEV1/FVC 0.76 PFT 11/03/14-moderately severe obstructive airways disease with air trapping, normal diffusion capacity, slight response to bronchodilator. FEV1 1.58/64%, FEV1/FVC 0.59, RV 169%, DLCO 93%. Allergy labs 08/05/14- broadly positive mites, cat, dog, grass, tree, weed pollens Total IgE 243, elevated for many foods Eosinophils 6.8% Lab 05/09/22- EOS 7.5 H, IgE 1,556 H -----------------------------------------------------   03/02/23- 68 year old female former smoker(30 pkyrs) followed for asthma/COPD (quit Xolair-cost), allergic Rhinitis, complicated by HTN, history 3 lung nodules/stable since 2012 - saw Dr. Laneta Simmers, GERD, Glaucoma, Hiatal Hernia, Breast CA/ bil mastectomy, Palpitation,   -Proair hfa, Breo 200, Flonase, Astelin,   Avoiding LAMAs due to glaucoma Lab 05/09/22- EOS 7.5 H, IgE 1,556 H She chose not to start Dupixent. -----Increased SOB, wheezing and cough with yellow sputum over the past wk.  Light cold caught from toddler grandchild. Daughter has same illness and tested neg for flu and covid, so patient did not test. She feels she can clear this on her own- no fever or purulent.  Inhalers controlling wheeze.  08/31/23- 68 year old female former smoker(30 pkyrs) followed for asthma/COPD (quit Xolair-cost), allergic Rhinitis, complicated by HTN, history 3 lung nodules/stable since 2012 - saw Dr. Laneta Simmers, GERD, Glaucoma, Hiatal Hernia, Breast CA/ bil mastectomy, Palpitation,   -Proair hfa, Breo 200, Flonase, Astelin,   Avoiding LAMAs due to glaucoma      ROIncreased SOB, wheezing and cough with yellow sputum over the past wk. S- see HPI + = positive Constitutional:   No-   weight loss, night sweats, fevers, chills, fatigue, lassitude. HEENT:    headaches, no-difficulty swallowing, tooth/dental problems, sore throat,      No-sneezing, no- itching, ear ache, nasal congestion, post nasal drip,  CV:  No-chest pain, orthopnea, PND, swelling in lower extremities, anasarca, dizziness, +palpitations Resp: +  shortness of breath with exertion or at rest.            productive cough,    non-productive cough,  No-  coughing up of blood.             No-change in color of mucus.    +wheezing.   Skin: No-   rash or lesions. GI:  No-  heartburn, indigestion, no-abdominal pain, no-nausea, vomiting,  GU:  MS:  No-   joint pain or swelling.   Neuro- nothing unusual Psych:  No- change in mood or affect. + depression or anxiety.  No memory loss.  OBJ General- Alert, Oriented, Affect-appropriate, Distress- none acute Skin- clear Lymphadenopathy- none Head- atraumatic            Eyes- Gross vision intact, PERRLA, conjunctivae clear secretions            Ears- Hearing, canals            Nose- Clear, no-Septal dev, mucus, polyps, erosion, perforation             Throat- Mallampati II , mucosa clear , drainage-none, tonsils- atrophic.  Neck- flexible , trachea midline, no stridor , thyroid nl, carotid no bruit Chest - symmetrical excursion , unlabored           Heart/CV- RRR , no murmur , no gallop  , no rub, nl s1 s2                           - JVD- none ,  edema- none, stasis changes- none, varices- none           Lung-   +distant breath sounds , +coarse,  dullness-none, rub- none, wheezing-none, Cough+ light           Chest wall-  Abd-  Br/ Gen/ Rectal- Not done, not indicated Extrem- cyanosis- none, clubbing, none, atrophy- none, strength- nl Neuro- grossly intact to observation

## 2023-08-31 ENCOUNTER — Ambulatory Visit: Payer: 59 | Admitting: Internal Medicine

## 2023-08-31 ENCOUNTER — Encounter: Payer: Self-pay | Admitting: Internal Medicine

## 2023-09-11 ENCOUNTER — Telehealth: Payer: Self-pay | Admitting: Internal Medicine

## 2023-09-11 MED ORDER — AZITHROMYCIN 250 MG PO TABS: ORAL_TABLET | ORAL | 0 refills | Status: DC

## 2023-09-11 MED ORDER — PREDNISONE 10 MG PO TABS: ORAL_TABLET | ORAL | 0 refills | Status: DC

## 2023-09-11 NOTE — Telephone Encounter (Signed)
 Patient would like a prescription of antibiotics and prednisone  [facility] in [place] [place] [employer] [place]  [address]

## 2023-09-11 NOTE — Telephone Encounter (Signed)
 Prednisone and Zpak sent to her drug store

## 2023-09-11 NOTE — Telephone Encounter (Signed)
 Called patient.  Reviewed information per Dr. Maple Hudson.  Patient verbalized understanding.  Nothing further needed.

## 2023-09-11 NOTE — Telephone Encounter (Signed)
 Called patient.  C/O chest congestion and cough is getting more productive with dark yellow phlegm.  Patient states she is also wheezing and SOB.  Patient states no fever.  Sx x 3 weeks.  Patient using albuterol and Breo daily.  Patient requesting an antibiotic and predisone.  Pharmacy is [facility] on [address] in [place], [place]  Current Outpatient Medications on File Prior to Visit  Medication Sig Dispense Refill   acetaminophen (TYLENOL) 500 MG tablet Take by mouth as needed.     albuterol (PROAIR HFA) 108 (90 Base) MCG/ACT inhaler Inhale 2 puffs every 6 hours as needed 54 g 3   ALPRAZolam (XANAX) 0.5 MG tablet Take 1 tablet (0.5 mg total) by mouth 2 (two) times daily as needed. 60 tablet 3   anastrozole (ARIMIDEX) 1 MG tablet Take 1 mg by mouth daily.     azelastine (ASTELIN) 0.1 % nasal spray USE 1 TO 2 SPRAYS IN EACH NOSTRIL ONCE DAILY AT BEDTIME 90 mL 3   calcium carbonate (OS-CAL) 600 MG TABS tablet Take 600 mg by mouth 2 (two) times daily with a meal.     fluticasone (FLONASE) 50 MCG/ACT nasal spray 1 puff each nostril twice daiy 16 g 12   fluticasone furoate-vilanterol (BREO ELLIPTA) 200-25 MCG/ACT AEPB Inhale 1 puff into the lungs daily. 180 each 3   latanoprost (XALATAN) 0.005 % ophthalmic solution      magnesium oxide (MAG-OX) 400 MG tablet Take 400 mg by mouth daily.     Multiple Vitamin (MULTIVITAMIN) tablet Take 1 tablet by mouth daily.     olmesartan-hydrochlorothiazide (BENICAR HCT) 20-12.5 MG tablet      Omega-3 Fatty Acids (FISH OIL) 1000 MG CPDR Take 1 capsule by mouth daily.     Current Facility-Administered Medications on File Prior to Visit  Medication Dose Route Frequency Provider Last Rate Last Admin   ipratropium-albuterol (DUONEB) 0.5-2.5 (3) MG/3ML nebulizer solution 3 mL  3 mL Nebulization Q4H PRN Cobb, Ruby Cola, NP   3 mL at 12/09/21 1139    Allergies  Allergen Reactions   Pistachio Nut Extract Anaphylaxis   Biaxin [Clarithromycin]     GI UPSET   Codeine  Other (See Comments)   Penicillins    Tetracycline    Valsartan     Other reaction(s): Other (See Comments), Unknown

## 2023-09-17 NOTE — Progress Notes (Unsigned)
 HPI Female former smoker followed for asthma complicated by hypertension and history of 3 lung nodules followed by Dr. Laneta Simmers.   PFT 01/13/2003-FEV1 2.5 L/92%, FEV1/FVC 0.76 PFT 11/03/14-moderately severe obstructive airways disease with air trapping, normal diffusion capacity, slight response to bronchodilator. FEV1 1.58/64%, FEV1/FVC 0.59, RV 169%, DLCO 93%. Allergy labs 08/05/14- broadly positive mites, cat, dog, grass, tree, weed pollens Total IgE 243, elevated for many foods Eosinophils 6.8% Lab 05/09/22- EOS 7.5 H, IgE 1,556 H -----------------------------------------------------   03/02/23- 68 year old female former smoker(30 pkyrs) followed for asthma/COPD (quit Xolair-cost), allergic Rhinitis, complicated by HTN, history 3 lung nodules/stable since 2012 - saw Dr. Laneta Simmers, GERD, Glaucoma, Hiatal Hernia, Breast CA/ bil mastectomy, Palpitation,   -Proair hfa, Breo 200, Flonase, Astelin,   Avoiding LAMAs due to glaucoma Lab 05/09/22- EOS 7.5 H, IgE 1,556 H She chose not to start Dupixent. -----Increased SOB, wheezing and cough with yellow sputum over the past wk.  Light cold caught from toddler grandchild. Daughter has same illness and tested neg for flu and covid, so patient did not test. She feels she can clear this on her own- no fever or purulent.  Inhalers controlling wheeze.  09/19/23- 68 year old female former smoker(30 pkyrs) followed for asthma/COPD (quit Xolair-cost), allergic Rhinitis, complicated by HTN, history 3 lung nodules/stable since 2012 - saw Dr. Laneta Simmers, GERD, Glaucoma, Hiatal Hernia, Breast CA/ bil mastectomy, Palpitation, Osteopenia,   -Proair hfa, Breo 200, Flonase, Astelin,   Avoiding LAMAs due to glaucoma Discussed the use of AI scribe software for clinical note transcription with the patient, who gave verbal consent to proceed. History of Present Illness   The patient, with a history of rAsthma/ COPD, lung nodules , and breast cancer, managed with albuterol/ Breo   presents for a routine follow-up. She reports no new symptoms or concerns. She recently transitioned to [employer] and received her first delivery of medications. She is unsure of her current refill status. The patient has not had a chest x-ray in a couple of years, but reports no new respiratory symptoms. She denies any new lumps or bumps.     ROIncreased SOB, wheezing and cough with yellow sputum over the past wk. S- see HPI + = positive Constitutional:   No-   weight loss, night sweats, fevers, chills, fatigue, lassitude. HEENT:    headaches, no-difficulty swallowing, tooth/dental problems, sore throat,     No-sneezing, no- itching, ear ache, nasal congestion, post nasal drip,  CV:  No-chest pain, orthopnea, PND, swelling in lower extremities, anasarca, dizziness, +palpitations Resp: +  shortness of breath with exertion or at rest.            productive cough,    non-productive cough,  No-  coughing up of blood.             No-change in color of mucus.    +wheezing.   Skin: No-   rash or lesions. GI:  No-  heartburn, indigestion, no-abdominal pain, no-nausea, vomiting,  GU:  MS:  No-   joint pain or swelling.   Neuro- nothing unusual Psych:  No- change in mood or affect. + depression or anxiety.  No memory loss.  OBJ General- Alert, Oriented, Affect-appropriate, Distress- none acute Skin- clear Lymphadenopathy- none Head- atraumatic            Eyes- Gross vision intact, PERRLA, conjunctivae clear secretions            Ears- Hearing, canals  Nose- Clear, no-Septal dev, mucus, polyps, erosion, perforation             Throat- Mallampati II , mucosa clear , drainage-none, tonsils- atrophic.  Neck- flexible , trachea midline, no stridor , thyroid nl, carotid no bruit Chest - symmetrical excursion , unlabored           Heart/CV- RRR , no murmur , no gallop  , no rub, nl s1 s2                           - JVD- none , edema- none, stasis changes- none, varices- none            Lung-   +distant breath sounds , +coarse,  dullness-none, rub- none, wheezing-none, Cough+ light           Chest wall-  Abd-  Br/ Gen/ Rectal- Not done, not indicated Extrem- cyanosis- none, clubbing, none, atrophy- none, strength- nl Neuro- grossly intact to observation Assessment and Plan:    Pulmonary nodules Pulmonary nodules have been stable with no current respiratory symptoms such as wheezing, and a steady heart rate. The last chest x-ray on April 10, 2022, showed stability. - Order chest x-ray to assess for changes in pulmonary nodules - Consider chest CT if the chest x-ray shows concerning changes or if the radiologist recommends further evaluation  Medication management She is managing her medications, including albuterol, and reports prescriptions are current. Recent transition to [facility] insurance may affect medication delivery and refills. - Monitor medication refills and ensure prescriptions are up to date

## 2023-09-19 ENCOUNTER — Encounter: Payer: Self-pay | Admitting: Internal Medicine

## 2023-09-19 ENCOUNTER — Ambulatory Visit: Admitting: Internal Medicine

## 2023-09-19 ENCOUNTER — Ambulatory Visit

## 2023-09-19 VITALS — BP 118/64 | HR 75 | Temp 98.0°F | Ht 62.0 in | Wt 141.8 lb

## 2023-09-19 DIAGNOSIS — Z853 Personal history of malignant neoplasm of breast: Secondary | ICD-10-CM

## 2023-09-19 DIAGNOSIS — J449 Chronic obstructive pulmonary disease, unspecified: Secondary | ICD-10-CM

## 2023-09-19 DIAGNOSIS — Z87891 Personal history of nicotine dependence: Secondary | ICD-10-CM | POA: Diagnosis not present

## 2023-09-19 DIAGNOSIS — K449 Diaphragmatic hernia without obstruction or gangrene: Secondary | ICD-10-CM | POA: Diagnosis not present

## 2023-09-19 DIAGNOSIS — R918 Other nonspecific abnormal finding of lung field: Secondary | ICD-10-CM | POA: Diagnosis not present

## 2023-09-19 NOTE — Patient Instructions (Signed)
 Order- CXR   dx COPD mixed type, hx breast Ca  We cn continue current meds  Please call if we can help

## 2023-09-22 DIAGNOSIS — H04123 Dry eye syndrome of bilateral lacrimal glands: Secondary | ICD-10-CM | POA: Diagnosis not present

## 2023-09-22 DIAGNOSIS — H401231 Low-tension glaucoma, bilateral, mild stage: Secondary | ICD-10-CM | POA: Diagnosis not present

## 2023-09-29 ENCOUNTER — Telehealth: Payer: Self-pay

## 2023-09-29 NOTE — Telephone Encounter (Signed)
 Called patient regarding Cxr result, patient verbalized understanding. 09/29/2023.

## 2023-09-29 NOTE — Telephone Encounter (Signed)
-----   Message from Jetty Duhamel sent at 09/27/2023  2:43 PM EDT ----- CXR looks stable. No acute problem seen.

## 2024-01-23 DIAGNOSIS — M810 Age-related osteoporosis without current pathological fracture: Secondary | ICD-10-CM | POA: Diagnosis not present

## 2024-01-23 DIAGNOSIS — Z780 Asymptomatic menopausal state: Secondary | ICD-10-CM | POA: Diagnosis not present

## 2024-01-23 DIAGNOSIS — C50919 Malignant neoplasm of unspecified site of unspecified female breast: Secondary | ICD-10-CM | POA: Diagnosis not present

## 2024-01-23 DIAGNOSIS — Z13820 Encounter for screening for osteoporosis: Secondary | ICD-10-CM | POA: Diagnosis not present

## 2024-02-01 DIAGNOSIS — E7800 Pure hypercholesterolemia, unspecified: Secondary | ICD-10-CM | POA: Diagnosis not present

## 2024-02-01 DIAGNOSIS — Z0000 Encounter for general adult medical examination without abnormal findings: Secondary | ICD-10-CM | POA: Diagnosis not present

## 2024-02-01 DIAGNOSIS — M810 Age-related osteoporosis without current pathological fracture: Secondary | ICD-10-CM | POA: Diagnosis not present

## 2024-02-01 DIAGNOSIS — K219 Gastro-esophageal reflux disease without esophagitis: Secondary | ICD-10-CM | POA: Diagnosis not present

## 2024-02-01 DIAGNOSIS — Z79899 Other long term (current) drug therapy: Secondary | ICD-10-CM | POA: Diagnosis not present

## 2024-02-01 DIAGNOSIS — E559 Vitamin D deficiency, unspecified: Secondary | ICD-10-CM | POA: Diagnosis not present

## 2024-02-01 DIAGNOSIS — I10 Essential (primary) hypertension: Secondary | ICD-10-CM | POA: Diagnosis not present

## 2024-02-02 DIAGNOSIS — C50919 Malignant neoplasm of unspecified site of unspecified female breast: Secondary | ICD-10-CM | POA: Diagnosis not present

## 2024-04-05 DIAGNOSIS — H401231 Low-tension glaucoma, bilateral, mild stage: Secondary | ICD-10-CM | POA: Diagnosis not present

## 2024-04-05 DIAGNOSIS — H2513 Age-related nuclear cataract, bilateral: Secondary | ICD-10-CM | POA: Diagnosis not present

## 2024-04-05 DIAGNOSIS — H5203 Hypermetropia, bilateral: Secondary | ICD-10-CM | POA: Diagnosis not present

## 2024-04-05 DIAGNOSIS — H0100A Unspecified blepharitis right eye, upper and lower eyelids: Secondary | ICD-10-CM | POA: Diagnosis not present

## 2024-04-05 DIAGNOSIS — H04123 Dry eye syndrome of bilateral lacrimal glands: Secondary | ICD-10-CM | POA: Diagnosis not present

## 2024-04-05 DIAGNOSIS — H52203 Unspecified astigmatism, bilateral: Secondary | ICD-10-CM | POA: Diagnosis not present

## 2024-04-05 DIAGNOSIS — H0100B Unspecified blepharitis left eye, upper and lower eyelids: Secondary | ICD-10-CM | POA: Diagnosis not present

## 2024-04-05 DIAGNOSIS — H25012 Cortical age-related cataract, left eye: Secondary | ICD-10-CM | POA: Diagnosis not present

## 2024-04-05 DIAGNOSIS — H524 Presbyopia: Secondary | ICD-10-CM | POA: Diagnosis not present

## 2024-06-05 ENCOUNTER — Ambulatory Visit: Payer: Self-pay | Admitting: Internal Medicine

## 2024-06-05 NOTE — Telephone Encounter (Signed)
 FYI Only or Action Required?: FYI only for provider: appointment scheduled on 06/06/24.  Patient is followed in Pulmonology for Asthma, last seen on 09/19/2023 by Neysa Reggy BIRCH, MD.  Called Nurse Triage reporting Shortness of Breath (/).  Symptoms began about a month ago.  Interventions attempted: Rescue inhaler and Maintenance inhaler.  Symptoms are: gradually worsening.  Triage Disposition: See PCP When Office is Open (Within 3 Days)  Patient/caregiver understands and will follow disposition?: Yes   Moderate SOB and mild wheezing for the past month. With exertion only. Cough with yellow mucus, no blood. Has some chest tightness for the past month. Occurs with asthma flares. Denies CP or fever. Watches her granddaughters, thinks she may have gotten something from them. Has not gotten flu vaccine yet. Has been using albuterol  inhaler 1-2 puffs 1-2x/day. Taking breo maintenance inhaler. Schedule appt with primary pulmonary provider tomorrow. Advised UC or ED for worsening symptoms.   [facility] Pulmonary Triage - Initial Assessment Questions Chief Complaint (e.g., cough, sob, wheezing, fever, chills, sweat or additional symptoms) *Go to specific symptom protocol after initial questions. Moderate SOB with exertion. Moderate intermittent wheezing.   How long have symptoms been present? Several weeks  Have you tested for COVID or Flu? Note: If not, ask patient if a home test can be taken. If so, instruct patient to call back for positive results. No  MEDICINES:   Have you used any OTC meds to help with symptoms? No If yes, ask What medications? Denies  Have you used your inhalers/maintenance medication? Yes If yes, What medications? Breo inahler  If inhaler, ask How many puffs and how often? Note: Review instructions on medication in the chart. Albuterol  inhaler about 1-2x/day, 1 or 2 puffs.  OXYGEN: Do you wear supplemental oxygen? No If yes, How many liters are  you supposed to use? Denies  Do you monitor your oxygen levels? No If yes, What is your reading (oxygen level) today? Denies. Uses a peak flow meter, was in the yellow recently.  What is your usual oxygen saturation reading?  (Note: Pulmonary O2 sats should be 90% or greater) Does not measure at home. Lat OV reading 99%.    Message from Isabell A sent at 06/05/2024 11:20 AM EST  Reason for Triage: Patient is requesting antibiotic/prednisone  for congestion - wheezing and sob (pt has asthma, not happening at the moment - good days and bad days)  Callback number: [phone]   Reason for Disposition  [1] Mild wheezing comes and goes AND [2] present > 3 days  Answer Assessment - Initial Assessment Questions 1. RESPIRATORY STATUS: Describe your breathing? (e.g., wheezing, shortness of breath, unable to speak, severe coughing)      Moderate SOB and wheezing with exertion  2. ONSET: When did this asthma attack begin?      Not an attack. Symptoms started a few weeks ago  3. TRIGGER: What do you think triggered this attack? (e.g., URI, exposure to pollen or other allergen, tobacco smoke)      Thinks her grand kids got her sick  4. PEAK EXPIRATORY FLOW RATE (PEFR): Do you use a peak flow meter? If Yes, ask: What's the current peak flow? What's your personal best peak flow?      Used it recently, was in the yellow  5. SEVERITY: How bad is this attack?      Denies having any asthma attacks over the past month  6. ASTHMA MEDICINES:  What treatments have you tried?  Breo and albuterol  inhaler  7. INHALED QUICK-RELIEF TREATMENTS FOR THIS ATTACK: What treatments have you given yourself so far? and How many and how often? If using an inhaler, ask, How many puffs? Note: Routine treatments are 2 puffs every 4 hours as needed. Rescue treatments are 4 puffs repeated every 20 minutes, up to three times as needed.      Albuterol  inhaler 1-2 puffs 1-2x/day  8. OTHER  SYMPTOMS: Do you have any other symptoms? (e.g., chest pain, coughing up yellow sputum, fever, runny nose)     Coughing up yellow mucus, denies blood. Chest tightness and congestion. Occurs with her asthma flares.  9. O2 SATURATION MONITOR:  Do you use an oxygen saturation monitor (pulse oximeter) at home? If Yes, What is your reading (oxygen level) today? What is your usual oxygen saturation reading? (e.g., 95%)     Does not monitor.  Protocols used: Asthma Attack-A-AH

## 2024-06-06 ENCOUNTER — Ambulatory Visit: Admitting: Internal Medicine

## 2024-06-06 ENCOUNTER — Encounter: Payer: Self-pay | Admitting: Internal Medicine

## 2024-06-06 VITALS — BP 104/64 | HR 72 | Temp 97.8°F | Ht 63.0 in | Wt 143.8 lb

## 2024-06-06 DIAGNOSIS — J441 Chronic obstructive pulmonary disease with (acute) exacerbation: Secondary | ICD-10-CM | POA: Diagnosis not present

## 2024-06-06 DIAGNOSIS — R053 Chronic cough: Secondary | ICD-10-CM

## 2024-06-06 DIAGNOSIS — Z23 Encounter for immunization: Secondary | ICD-10-CM | POA: Diagnosis not present

## 2024-06-06 DIAGNOSIS — J310 Chronic rhinitis: Secondary | ICD-10-CM

## 2024-06-06 DIAGNOSIS — Z87891 Personal history of nicotine dependence: Secondary | ICD-10-CM

## 2024-06-06 DIAGNOSIS — J449 Chronic obstructive pulmonary disease, unspecified: Secondary | ICD-10-CM

## 2024-06-06 MED ORDER — AZITHROMYCIN 250 MG PO TABS: ORAL_TABLET | ORAL | 0 refills | Status: AC

## 2024-06-06 MED ORDER — PREDNISONE 10 MG PO TABS: ORAL_TABLET | ORAL | 0 refills | Status: DC

## 2024-06-06 MED ORDER — FLUTICASONE PROPIONATE 50 MCG/ACT NA SUSP: NASAL | 12 refills | Status: AC

## 2024-06-06 MED ORDER — ALBUTEROL SULFATE HFA 108 (90 BASE) MCG/ACT IN AERS: INHALATION_SPRAY | RESPIRATORY_TRACT | 3 refills | Status: AC

## 2024-06-06 MED ORDER — COMPRESSOR/NEBULIZER MISC: 1 refills | Status: AC

## 2024-06-06 MED ORDER — FLUTICASONE FUROATE-VILANTEROL 200-25 MCG/ACT IN AEPB: 1.0000 | INHALATION_SPRAY | Freq: Every day | RESPIRATORY_TRACT | 3 refills | Status: DC

## 2024-06-06 NOTE — Patient Instructions (Signed)
 Order- new DME (lives in [place])-  compressor nebulizer and script for DuoNeb neb solution (ipratropium- albuterol ) 360 ml, ref x 12, 1 neb every 6 hours as needed    Dx COPD mixed type  Scipts sent refilling inhalers and providing Zpak antibiotic and prednisone  taper  We discussed calling [facility] for help establishing a new primary care provider and maybe Pulmonologist in the [place] area.  Order- flu vax senior

## 2024-06-06 NOTE — Progress Notes (Signed)
 HPI Female former smoker followed for asthma complicated by hypertension and history of 3 lung nodules followed by Dr. Lucas.   PFT 01/13/2003-FEV1 2.5 L/92%, FEV1/FVC 0.76 PFT 11/03/14-moderately severe obstructive airways disease with air trapping, normal diffusion capacity, slight response to bronchodilator. FEV1 1.58/64%, FEV1/FVC 0.59, RV 169%, DLCO 93%. Allergy  labs 08/05/14- broadly positive mites, cat, dog, grass, tree, weed pollens Total IgE 243, elevated for many foods Eosinophils 6.8% Lab 05/09/22- EOS 7.5 H, IgE 1,556 H -----------------------------------------------------   09/19/23- 68 year old female former smoker(30 pkyrs) followed for asthma/COPD (quit Xolair -cost), allergic Rhinitis, complicated by HTN, history 3 lung nodules/stable since 2012 - saw Dr. Lucas, GERD, Glaucoma, Hiatal Hernia, Breast CA/ bil mastectomy, Palpitation, Osteopenia,   -Proair  hfa, Breo 200, Flonase , Astelin ,   Avoiding LAMAs due to glaucoma Discussed the use of AI scribe software for clinical note transcription with the patient, who gave verbal consent to proceed. History of Present Illness   The patient, with a history of rAsthma/ COPD, lung nodules , and breast cancer, managed with albuterol / Breo  presents for a routine follow-up. She reports no new symptoms or concerns. She recently transitioned to [employer] and received her first delivery of medications. She is unsure of her current refill status. The patient has not had a chest x-ray in a couple of years, but reports no new respiratory symptoms. She denies any new lumps or bumps.   Assessment and Plan:    Pulmonary nodules Pulmonary nodules have been stable with no current respiratory symptoms such as wheezing, and a steady heart rate. The last chest x-ray on April 10, 2022, showed stability. - Order chest x-ray to assess for changes in pulmonary nodules - Consider chest CT if the chest x-ray shows concerning changes or if the  radiologist recommends further evaluation  Medication management She is managing her medications, including albuterol , and reports prescriptions are current. Recent transition to [facility] insurance may affect medication delivery and refills. - Monitor medication refills and ensure prescriptions are up to date    06/06/24-  68 year old female former smoker(30 pkyrs) followed for asthma/COPD (quit Xolair -cost), allergic Rhinitis, complicated by HTN, history 3 lung nodules/stable since 2012 - saw Dr. Lucas, GERD, Glaucoma, Hiatal Hernia, Breast CA/ bil mastectomy, Palpitation, Osteopenia,   -Proair  hfa, Breo 200, Flonase , Astelin ,   Avoiding LAMAs due to glaucoma -----Cough with chest congestion since October 2025. Discussed the use of AI scribe software for clinical note transcription with the patient, who gave verbal consent to proceed.  History of Present Illness   Kelsey Duran is a 68 year old female with asthma/COPD overlap who presents with cough and chest congestion.  She has had persistent cough and chest congestion since October with fluctuating severity and thick yellow sputum. She notes exposure to frequently ill Elizabelle Fite granddaughters.  She has had chills but no reported fever. Symptoms were first thought to be allergies but have not resolved.  Her past medical history includes asthma/COPD overlap, chronic rhinitis, bilateral mastectomy for breast cancer, three small stable monitored lung nodules, and a smoking history.  She uses Breo and ProAir  inhalers and has a prescription for Flonase  nasal spray. She has not yet obtained a nebulizer machine.   Sshe lives in [place] and will be changing to [facility] follow-up there.     Assessment and Plan    COPD with acute exacerbation COPD exacerbation with cough, chest congestion, yellowish sputum, and chills. Chest x-ray in April showed no acute cardiopulmonary disease. Treated with antibiotics and prednisone  taper due  to symptoms and  history. Z-Pak chosen for tolerability and reduced resistance risk. - Administered flu shot. - Prescribed Z-Pak (azithromycin ). - Prescribed prednisone  taper. - Refilled inhalers: Breo and ProAir . - Ordered DuoNeb nebulizer solution. - Attempted to send compressor nebulizer prescription to drugstore; if denied, will consider home care company.  Chronic rhinitis Ongoing congestion. No recent use of Flonase . - Refilled Flonase  nasal spray.       ROS- see HPI   + = positive Constitutional:   No-   weight loss, night sweats, fevers, chills, fatigue, lassitude. HEENT:    headaches, no-difficulty swallowing, tooth/dental problems, sore throat,     No-sneezing, no- itching, ear ache, nasal congestion, post nasal drip,  CV:  No-chest pain, orthopnea, PND, swelling in lower extremities, anasarca, dizziness, +palpitations Resp: +  shortness of breath with exertion or at rest.          + productive cough,    non-productive cough,  No-  coughing up of blood.             +change in color of mucus.    +wheezing.   Skin: No-   rash or lesions. GI:  No-  heartburn, indigestion, no-abdominal pain, no-nausea, vomiting,  GU:  MS:  No-   joint pain or swelling.   Neuro- nothing unusual Psych:  No- change in mood or affect. + depression or anxiety.  No memory loss.  OBJ General- Alert, Oriented, Affect-appropriate, Distress- none acute Skin- clear Lymphadenopathy- none Head- atraumatic            Eyes- Gross vision intact, PERRLA, conjunctivae clear secretions            Ears- Hearing, canals            Nose- Clear, no-Septal dev, mucus, polyps, erosion, perforation             Throat- Mallampati II , mucosa clear , drainage-none, tonsils- atrophic.  Neck- flexible , trachea midline, no stridor , thyroid  nl, carotid no bruit Chest - symmetrical excursion , unlabored           Heart/CV- RRR , no murmur , no gallop  , no rub, nl s1 s2                           - JVD- none , edema- none, stasis  changes- none, varices- none           Lung-   +distant breath sounds , +coarse,  dullness-none, rub- none, wheezing-none, Cough+ bronchitic           Chest wall-  Abd-  Br/ Gen/ Rectal- Not done, not indicated Extrem- cyanosis- none, clubbing, none, atrophy- none, strength- nl Neuro- grossly intact to observation

## 2024-06-11 ENCOUNTER — Ambulatory Visit: Payer: Self-pay | Admitting: Internal Medicine

## 2024-06-11 MED ORDER — PREDNISONE 10 MG PO TABS: ORAL_TABLET | ORAL | 0 refills | Status: AC

## 2024-06-11 NOTE — Telephone Encounter (Signed)
 Congestion -coughing and getting congestion out of nose is better now: not as thick and the color is getter better however the SOB is not where pt wants it to be. Pt states SOB has gotten but not at best. SOB worse when up moving around: moderate to severe and at rest mild. Pt would to like to request another round of prednisone  for an extended period of time or at a higher dose which ever PCP thinks would be best.

## 2024-06-11 NOTE — Telephone Encounter (Signed)
 I have sent an extended prednisone  taper to [facility]

## 2024-06-11 NOTE — Telephone Encounter (Signed)
" ° ° ° °  FYI Only or Action Required?: Action required by provider: clinical question for provider.  Patient was last seen in primary care on 03/02/2023.  Called Nurse Triage reporting Shortness of Breath.  Symptoms began Sunday.  Interventions attempted: Prescription medications: prednisone .  Symptoms are: gradually improving.  Triage Disposition: See PCP When Office is Open (Within 3 Days)  Patient/caregiver understands and will follow disposition?: No, wishes to speak with PCPCopied from CRM #[id number]. Topic: Clinical - Red Word Triage >> Jun 11, 2024  8:24 AM Isabell A wrote: Red Word that prompted transfer to Nurse Triage: SOB - requesting longer dosage of prednisone . Reason for Disposition  [1] MODERATE longstanding difficulty breathing (e.g., speaks in phrases, SOB even at rest, pulse 100-120) AND [2] SAME as normal  Answer Assessment - Initial Assessment Questions 1. RESPIRATORY STATUS: Describe your breathing? (e.g., wheezing, shortness of breath, unable to speak, severe coughing)      SOB 2. ONSET: When did this breathing problem begin?      Sunday (came into office on Thursday) 3. PATTERN Does the difficult breathing come and go, or has it been constant since it started?      Comes and goes 4. SEVERITY: How bad is your breathing? (e.g., mild, moderate, severe)      Moderate  5. RECURRENT SYMPTOM: Have you had difficulty breathing before? If Yes, ask: When was the last time? and What happened that time?      yes 6. CARDIAC HISTORY: Do you have any history of heart disease? (e.g., heart attack, angina, bypass surgery, angioplasty)      na 7. LUNG HISTORY: Do you have any history of lung disease?  (e.g., pulmonary embolus, asthma, emphysema)     Asthma, COPD 8. CAUSE: What do you think is causing the breathing problem?      na 9. OTHER SYMPTOMS: Do you have any other symptoms? (e.g., chest pain, cough, dizziness, fever, runny nose)     cough 10. O2  SATURATION MONITOR:  Do you use an oxygen saturation monitor (pulse oximeter) at home? If Yes, ask: What is your reading (oxygen level) today? What is your usual oxygen saturation reading? (e.g., 95%)       na 11. PREGNANCY: Is there any chance you are pregnant? When was your last menstrual period?       na 12. TRAVEL: Have you traveled out of the country in the last month? (e.g., travel history, exposures)       na  Congestion -coughing and getting congestion out of nose is better now: not as thick and the color is getter better however the SOB is not where pt wants it to be.  Pt states SOB has gotten but not at best.  SOB worse when up moving around: moderate to severe and at rest mild.  Pt would to like to request another round of prednisone  for an extended period of time or at a higher dose which ever PCP thinks would be best.  Protocols used: Breathing Difficulty-A-AH  "

## 2024-06-11 NOTE — Telephone Encounter (Signed)
 Does she feel she is getting better? Does she have something in mind that she would like us  to do to help? If she is really uncomfortable and not improving, it would be best for her to be seen in person at an Urgent Care or ER.

## 2024-06-11 NOTE — Telephone Encounter (Signed)
Spoke with the pt and notified of response from Dr Annamaria Boots. She verbalized understanding. Nothing further needed.

## 2024-06-27 ENCOUNTER — Encounter: Payer: Self-pay | Admitting: Internal Medicine

## 2024-07-02 ENCOUNTER — Other Ambulatory Visit: Payer: Self-pay | Admitting: Internal Medicine

## 2024-07-05 NOTE — Telephone Encounter (Signed)
 Pt requesting Breo refill. Previous Dr. Neysa pt. Per 06/06/24 OV notes:  -Proair  hfa, Breo 200, Flonase , Astelin ,   Routing to Dr. Olena to advise refill request, as Dr. Neysa has retired. Please advise, thank you!

## 2024-09-19 ENCOUNTER — Ambulatory Visit: Admitting: Internal Medicine
